# Patient Record
Sex: Female | Born: 1940 | Race: White | Hispanic: No | Smoking: Never smoker
Health system: Southern US, Community
[De-identification: ages and names within clinical notes are randomized; demographics above are authoritative.]

## PROBLEM LIST (undated history)

## (undated) DIAGNOSIS — Q2381 Bicuspid aortic valve: Secondary | ICD-10-CM

## (undated) DIAGNOSIS — R002 Palpitations: Secondary | ICD-10-CM

## (undated) DIAGNOSIS — J189 Pneumonia, unspecified organism: Secondary | ICD-10-CM

## (undated) DIAGNOSIS — M1990 Unspecified osteoarthritis, unspecified site: Secondary | ICD-10-CM

## (undated) DIAGNOSIS — F32A Depression, unspecified: Secondary | ICD-10-CM

## (undated) DIAGNOSIS — I739 Peripheral vascular disease, unspecified: Secondary | ICD-10-CM

## (undated) DIAGNOSIS — I10 Essential (primary) hypertension: Secondary | ICD-10-CM

## (undated) DIAGNOSIS — F329 Major depressive disorder, single episode, unspecified: Secondary | ICD-10-CM

## (undated) DIAGNOSIS — A809 Acute poliomyelitis, unspecified: Secondary | ICD-10-CM

## (undated) DIAGNOSIS — Q231 Congenital insufficiency of aortic valve: Secondary | ICD-10-CM

## (undated) DIAGNOSIS — E039 Hypothyroidism, unspecified: Secondary | ICD-10-CM

## (undated) DIAGNOSIS — R011 Cardiac murmur, unspecified: Secondary | ICD-10-CM

## (undated) DIAGNOSIS — E079 Disorder of thyroid, unspecified: Secondary | ICD-10-CM

## (undated) DIAGNOSIS — F419 Anxiety disorder, unspecified: Secondary | ICD-10-CM

## (undated) HISTORY — PX: TONSILLECTOMY: SUR1361

## (undated) HISTORY — PX: EYE SURGERY: SHX253

## (undated) HISTORY — PX: HERNIA REPAIR: SHX51

## (undated) HISTORY — PX: JOINT REPLACEMENT: SHX530

## (undated) HISTORY — PX: BREAST ENHANCEMENT SURGERY: SHX7

## (undated) HISTORY — PX: ABDOMINAL HYSTERECTOMY: SHX81

## (undated) HISTORY — PX: HAMMER TOE SURGERY: SHX385

## (undated) HISTORY — PX: BUNIONECTOMY: SHX129

## (undated) HISTORY — PX: BREAST SURGERY: SHX581

## (undated) HISTORY — PX: KNEE SURGERY: SHX244

## (undated) HISTORY — PX: CARDIAC CATHETERIZATION: SHX172

---

## 1991-09-10 HISTORY — PX: RADICAL VAGINAL HYSTERECTOMY: SUR662

## 2009-12-05 ENCOUNTER — Emergency Department (HOSPITAL_COMMUNITY): Admission: EM | Admit: 2009-12-05 | Discharge: 2009-12-05 | Payer: Self-pay | Admitting: Emergency Medicine

## 2011-09-12 DIAGNOSIS — Z09 Encounter for follow-up examination after completed treatment for conditions other than malignant neoplasm: Secondary | ICD-10-CM | POA: Diagnosis not present

## 2011-09-12 DIAGNOSIS — Z471 Aftercare following joint replacement surgery: Secondary | ICD-10-CM | POA: Diagnosis not present

## 2011-09-12 DIAGNOSIS — Z96649 Presence of unspecified artificial hip joint: Secondary | ICD-10-CM | POA: Diagnosis not present

## 2011-10-03 DIAGNOSIS — E785 Hyperlipidemia, unspecified: Secondary | ICD-10-CM | POA: Diagnosis not present

## 2011-10-03 DIAGNOSIS — E559 Vitamin D deficiency, unspecified: Secondary | ICD-10-CM | POA: Diagnosis not present

## 2011-10-03 DIAGNOSIS — F411 Generalized anxiety disorder: Secondary | ICD-10-CM | POA: Diagnosis not present

## 2011-10-03 DIAGNOSIS — E039 Hypothyroidism, unspecified: Secondary | ICD-10-CM | POA: Diagnosis not present

## 2011-11-28 DIAGNOSIS — E785 Hyperlipidemia, unspecified: Secondary | ICD-10-CM | POA: Diagnosis not present

## 2011-11-28 DIAGNOSIS — F411 Generalized anxiety disorder: Secondary | ICD-10-CM | POA: Diagnosis not present

## 2011-11-28 DIAGNOSIS — R030 Elevated blood-pressure reading, without diagnosis of hypertension: Secondary | ICD-10-CM | POA: Diagnosis not present

## 2012-01-15 DIAGNOSIS — Z1231 Encounter for screening mammogram for malignant neoplasm of breast: Secondary | ICD-10-CM | POA: Diagnosis not present

## 2012-02-19 ENCOUNTER — Emergency Department (HOSPITAL_COMMUNITY)
Admission: EM | Admit: 2012-02-19 | Discharge: 2012-02-19 | Disposition: A | Discharge status: Home / Self Care | Payer: Medicare Other | Attending: Emergency Medicine | Admitting: Emergency Medicine

## 2012-02-19 ENCOUNTER — Emergency Department (HOSPITAL_COMMUNITY): Payer: Medicare Other

## 2012-02-19 ENCOUNTER — Encounter (HOSPITAL_COMMUNITY): Payer: Self-pay | Admitting: Emergency Medicine

## 2012-02-19 DIAGNOSIS — R5381 Other malaise: Secondary | ICD-10-CM | POA: Diagnosis not present

## 2012-02-19 DIAGNOSIS — R531 Weakness: Secondary | ICD-10-CM

## 2012-02-19 DIAGNOSIS — R209 Unspecified disturbances of skin sensation: Secondary | ICD-10-CM | POA: Diagnosis not present

## 2012-02-19 DIAGNOSIS — I10 Essential (primary) hypertension: Secondary | ICD-10-CM | POA: Diagnosis not present

## 2012-02-19 DIAGNOSIS — Z8612 Personal history of poliomyelitis: Secondary | ICD-10-CM | POA: Insufficient documentation

## 2012-02-19 DIAGNOSIS — R079 Chest pain, unspecified: Secondary | ICD-10-CM | POA: Diagnosis not present

## 2012-02-19 DIAGNOSIS — E079 Disorder of thyroid, unspecified: Secondary | ICD-10-CM | POA: Insufficient documentation

## 2012-02-19 DIAGNOSIS — Z79899 Other long term (current) drug therapy: Secondary | ICD-10-CM | POA: Diagnosis not present

## 2012-02-19 DIAGNOSIS — Q231 Congenital insufficiency of aortic valve: Secondary | ICD-10-CM | POA: Insufficient documentation

## 2012-02-19 DIAGNOSIS — Z7982 Long term (current) use of aspirin: Secondary | ICD-10-CM | POA: Diagnosis not present

## 2012-02-19 DIAGNOSIS — N644 Mastodynia: Secondary | ICD-10-CM | POA: Diagnosis not present

## 2012-02-19 DIAGNOSIS — R5383 Other fatigue: Secondary | ICD-10-CM | POA: Insufficient documentation

## 2012-02-19 HISTORY — DX: Bicuspid aortic valve: Q23.81

## 2012-02-19 HISTORY — DX: Disorder of thyroid, unspecified: E07.9

## 2012-02-19 HISTORY — DX: Congenital insufficiency of aortic valve: Q23.1

## 2012-02-19 HISTORY — DX: Acute poliomyelitis, unspecified: A80.9

## 2012-02-19 HISTORY — DX: Essential (primary) hypertension: I10

## 2012-02-19 LAB — CBC
HCT: 42.5 % (ref 36.0–46.0)
Hemoglobin: 14.3 g/dL (ref 12.0–15.0)
MCH: 28.9 pg (ref 26.0–34.0)
MCHC: 33.6 g/dL (ref 30.0–36.0)
MCV: 85.9 fL (ref 78.0–100.0)
RBC: 4.95 MIL/uL (ref 3.87–5.11)
WBC: 8.5 10*3/uL (ref 4.0–10.5)

## 2012-02-19 LAB — BASIC METABOLIC PANEL
Calcium: 10.2 mg/dL (ref 8.4–10.5)
Creatinine, Ser: 0.53 mg/dL (ref 0.50–1.10)
GFR calc non Af Amer: >90 mL/min (ref >90)
Potassium: 3.4 mEq/L — ABNORMAL LOW (ref 3.5–5.1)

## 2012-02-19 LAB — URINE MICROSCOPIC-ADD ON

## 2012-02-19 LAB — URINALYSIS, ROUTINE W REFLEX MICROSCOPIC
Bilirubin Urine: NEGATIVE
Glucose, UA: NEGATIVE mg/dL
Ketones, ur: NEGATIVE mg/dL
Nitrite: NEGATIVE
Protein, ur: NEGATIVE mg/dL
Specific Gravity, Urine: 1.009 (ref 1.005–1.030)
Urobilinogen, UA: 0.2 mg/dL (ref 0.0–1.0)
pH: 7 (ref 5.0–8.0)

## 2012-02-19 LAB — DIFFERENTIAL
Eosinophils Relative: 2 % (ref 0–5)
Lymphocytes Relative: 23 % (ref 12–46)
Monocytes Absolute: 0.6 10*3/uL (ref 0.1–1.0)

## 2012-02-19 MED ORDER — SODIUM CHLORIDE 0.9 % IV SOLN
INTRAVENOUS | Status: DC
  Administered 2012-02-19: 20:00:00 via INTRAVENOUS

## 2012-02-19 NOTE — ED Notes (Signed)
Pt is randomly shaking and states its her way to relief stress and worry.

## 2012-02-19 NOTE — ED Notes (Signed)
Feeling shaky and weak. Feeling numb on feet and hands. Eating less today. No coughing or chest pain.

## 2012-02-19 NOTE — ED Notes (Signed)
Reports job as stressful and is worried about financial problems. 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

## 2012-02-19 NOTE — ED Provider Notes (Signed)
History     CSN: [id number]  Arrival date & time 02/19/12  1657   First MD Initiated Contact with Patient 02/19/12 1736      Chief Complaint  Patient presents with  . Fatigue    (Consider location/radiation/quality/duration/timing/severity/associated sxs/prior treatment) HPI Comments: Crystal Levy is a 71 y.o. female who has onset of a weak and shaky feeling, and bilateral numbness in hands, and feet today. She denies chest pain, weakness, dizziness, nausea, or vomiting. She has a sensation of stinging in her left chest and upper abdomen that started today.   The history is provided by the patient.    Past Medical History  Diagnosis Date  . Polio   . Hypertension   . Bicuspid aortic valve   . Thyroid disease     Past Surgical History  Procedure Date  . Hernia repair   . Joint replacement   . Tonsillectomy   . Breast surgery   . Abdominal hysterectomy   . Radical vaginal hysterectomy 1993    No family history on file.  History  Substance Use Topics  . Smoking status: Former Games developer  . Smokeless tobacco: Not on file  . Alcohol Use: 0.0 oz/week    0 Glasses of wine per week     wine    OB History    Grav Para Term Preterm Abortions TAB SAB Ect Mult Living                  Review of Systems  Allergies  Azithromycin and Eggs or egg-derived products  Home Medications   Current Outpatient Rx  Name Route Sig Dispense Refill  . AMLODIPINE BESYLATE 5 MG PO TABS Oral Take 5 mg by mouth daily.    Marland Kitchen VITAMIN C PO Oral Take 1 tablet by mouth daily.    . ASPIRIN 81 MG PO CHEW Oral Chew 648 mg by mouth once.    Marland Kitchen VITAMIN B COMPLEX PO Oral Take 1 tablet by mouth daily.    Marland Kitchen CALCIUM + D PO Oral Take 1 tablet by mouth daily.    Marland Kitchen DOCUSATE SODIUM 100 MG PO CAPS Oral Take 100 mg by mouth 2 (two) times daily as needed. For constipation.    . OMEGA-3 FATTY ACIDS 1000 MG PO CAPS Oral Take 2 g by mouth daily.    Marland Kitchen LEVOTHYROXINE SODIUM 112 MCG PO TABS Oral Take 112 mcg  by mouth daily.    Marland Kitchen LORAZEPAM 0.5 MG PO TABS Oral Take 0.5 mg by mouth 2 (two) times daily as needed. For sleep/anxiety.    . ADULT MULTIVITAMIN W/MINERALS CH Oral Take 1 tablet by mouth daily.    . SERTRALINE HCL 25 MG PO TABS Oral Take 25 mg by mouth daily.      BP 153/89  Pulse 70  Temp 98.3 F (36.8 C) (Oral)  Resp 16  SpO2 96%  Physical Exam  Nursing note and vitals reviewed. Constitutional: She is oriented to person, place, and time. She appears well-developed and well-nourished.  HENT:  Head: Normocephalic and atraumatic.  Eyes: Conjunctivae and EOM are normal. Pupils are equal, round, and reactive to light.  Neck: Normal range of motion and phonation normal. Neck supple.  Cardiovascular: Normal rate, regular rhythm and intact distal pulses.   Pulmonary/Chest: Effort normal and breath sounds normal. She exhibits no tenderness.  Abdominal: Soft. She exhibits no distension. There is no tenderness. There is no guarding.  Musculoskeletal: Normal range of motion.  Neurological: She is alert  and oriented to person, place, and time. She has normal strength. She exhibits normal muscle tone.  Skin: Skin is warm and dry.       Vague 1 cm, flat reddened area of the left breast, without vesicles. No other rash in a thoracic, or abdominal dermatome  Psychiatric: She has a normal mood and affect. Her behavior is normal. Judgment and thought content normal.    ED Course  Procedures (including critical care time)  Labs Reviewed  DIFFERENTIAL - Abnormal; Notable for the following:    Basophils Relative 2 (*)     All other components within normal limits  BASIC METABOLIC PANEL - Abnormal; Notable for the following:    Potassium 3.4 (*)     All other components within normal limits  URINALYSIS, ROUTINE W REFLEX MICROSCOPIC - Abnormal; Notable for the following:    Leukocytes, UA TRACE (*)     All other components within normal limits  CBC  TROPONIN I  URINE MICROSCOPIC-ADD ON   Dg  Chest 2 View  02/19/2012  *RADIOLOGY REPORT*  Clinical Data:   Left breast pain.  CHEST - 2 VIEW  Comparison: None.  Findings: Calcified bilateral breast implants are identified. Lungs are clear.  No pneumothorax or effusion.  Heart size normal. No focal bony abnormality.  IMPRESSION: No acute finding.  Original Report Authenticated By: Bernadene Bell. Maricela Curet, M.D.    Date: 02/19/2012  Rate: 64  Rhythm: normal sinus rhythm  QRS Axis: normal  Intervals: normal  ST/T Wave abnormalities: nonspecific ST changes  Conduction Disutrbances:none  Narrative Interpretation:   Old EKG Reviewed: none available   1. Weakness       MDM  Nonspecific malaise with possible early shingles. Doubt ACS, PE, or pneumonia. Doubt metabolic instability, serious bacterial infection or impending vascular collapse; the patient is stable for discharge.        Flint Melter, MD 02/19/12 2340

## 2012-02-19 NOTE — ED Notes (Signed)
MD at bedside. 

## 2012-02-19 NOTE — Discharge Instructions (Signed)

## 2012-02-19 NOTE — ED Notes (Signed)
Started at 1430 today. Started feeling "bee stings" in left breast. Then began with dizziness, nausea, weakness. Hx of Polio with right sided weakness/

## 2012-02-27 DIAGNOSIS — E559 Vitamin D deficiency, unspecified: Secondary | ICD-10-CM | POA: Diagnosis not present

## 2012-02-27 DIAGNOSIS — E039 Hypothyroidism, unspecified: Secondary | ICD-10-CM | POA: Diagnosis not present

## 2012-02-27 DIAGNOSIS — E785 Hyperlipidemia, unspecified: Secondary | ICD-10-CM | POA: Diagnosis not present

## 2012-02-27 DIAGNOSIS — I10 Essential (primary) hypertension: Secondary | ICD-10-CM | POA: Diagnosis not present

## 2012-02-27 DIAGNOSIS — Z0000 Encounter for general adult medical examination without abnormal findings: Secondary | ICD-10-CM | POA: Diagnosis not present

## 2012-02-27 DIAGNOSIS — Z79899 Other long term (current) drug therapy: Secondary | ICD-10-CM | POA: Diagnosis not present

## 2012-02-27 DIAGNOSIS — H6120 Impacted cerumen, unspecified ear: Secondary | ICD-10-CM | POA: Diagnosis not present

## 2012-02-27 DIAGNOSIS — Z124 Encounter for screening for malignant neoplasm of cervix: Secondary | ICD-10-CM | POA: Diagnosis not present

## 2012-02-27 DIAGNOSIS — N644 Mastodynia: Secondary | ICD-10-CM | POA: Diagnosis not present

## 2012-02-27 DIAGNOSIS — Z1211 Encounter for screening for malignant neoplasm of colon: Secondary | ICD-10-CM | POA: Diagnosis not present

## 2012-03-03 DIAGNOSIS — R159 Full incontinence of feces: Secondary | ICD-10-CM | POA: Diagnosis not present

## 2012-03-03 DIAGNOSIS — N3946 Mixed incontinence: Secondary | ICD-10-CM | POA: Diagnosis not present

## 2012-03-03 DIAGNOSIS — N319 Neuromuscular dysfunction of bladder, unspecified: Secondary | ICD-10-CM | POA: Diagnosis not present

## 2012-03-03 DIAGNOSIS — B49 Unspecified mycosis: Secondary | ICD-10-CM | POA: Diagnosis not present

## 2012-03-03 DIAGNOSIS — R300 Dysuria: Secondary | ICD-10-CM | POA: Diagnosis not present

## 2012-03-31 DIAGNOSIS — I739 Peripheral vascular disease, unspecified: Secondary | ICD-10-CM | POA: Diagnosis not present

## 2012-03-31 DIAGNOSIS — E785 Hyperlipidemia, unspecified: Secondary | ICD-10-CM | POA: Diagnosis not present

## 2012-03-31 DIAGNOSIS — I10 Essential (primary) hypertension: Secondary | ICD-10-CM | POA: Diagnosis not present

## 2012-03-31 DIAGNOSIS — F411 Generalized anxiety disorder: Secondary | ICD-10-CM | POA: Diagnosis not present

## 2012-05-13 DIAGNOSIS — Z96649 Presence of unspecified artificial hip joint: Secondary | ICD-10-CM | POA: Diagnosis not present

## 2012-05-13 DIAGNOSIS — Z471 Aftercare following joint replacement surgery: Secondary | ICD-10-CM | POA: Diagnosis not present

## 2012-05-13 DIAGNOSIS — Z09 Encounter for follow-up examination after completed treatment for conditions other than malignant neoplasm: Secondary | ICD-10-CM | POA: Diagnosis not present

## 2012-05-13 DIAGNOSIS — M25569 Pain in unspecified knee: Secondary | ICD-10-CM | POA: Diagnosis not present

## 2012-05-26 DIAGNOSIS — L820 Inflamed seborrheic keratosis: Secondary | ICD-10-CM | POA: Diagnosis not present

## 2012-05-27 DIAGNOSIS — E559 Vitamin D deficiency, unspecified: Secondary | ICD-10-CM | POA: Diagnosis not present

## 2012-05-27 DIAGNOSIS — E063 Autoimmune thyroiditis: Secondary | ICD-10-CM | POA: Diagnosis not present

## 2012-05-27 DIAGNOSIS — I10 Essential (primary) hypertension: Secondary | ICD-10-CM | POA: Diagnosis not present

## 2012-05-27 DIAGNOSIS — E785 Hyperlipidemia, unspecified: Secondary | ICD-10-CM | POA: Diagnosis not present

## 2012-07-14 DIAGNOSIS — H2510 Age-related nuclear cataract, unspecified eye: Secondary | ICD-10-CM | POA: Diagnosis not present

## 2012-07-14 DIAGNOSIS — H43819 Vitreous degeneration, unspecified eye: Secondary | ICD-10-CM | POA: Diagnosis not present

## 2012-07-14 DIAGNOSIS — H04129 Dry eye syndrome of unspecified lacrimal gland: Secondary | ICD-10-CM | POA: Diagnosis not present

## 2012-07-14 DIAGNOSIS — H1045 Other chronic allergic conjunctivitis: Secondary | ICD-10-CM | POA: Diagnosis not present

## 2012-09-15 DIAGNOSIS — J029 Acute pharyngitis, unspecified: Secondary | ICD-10-CM | POA: Diagnosis not present

## 2012-09-15 DIAGNOSIS — J111 Influenza due to unidentified influenza virus with other respiratory manifestations: Secondary | ICD-10-CM | POA: Diagnosis not present

## 2012-09-15 DIAGNOSIS — J329 Chronic sinusitis, unspecified: Secondary | ICD-10-CM | POA: Diagnosis not present

## 2012-09-15 DIAGNOSIS — R05 Cough: Secondary | ICD-10-CM | POA: Diagnosis not present

## 2012-10-08 DIAGNOSIS — E063 Autoimmune thyroiditis: Secondary | ICD-10-CM | POA: Diagnosis not present

## 2012-10-08 DIAGNOSIS — E785 Hyperlipidemia, unspecified: Secondary | ICD-10-CM | POA: Diagnosis not present

## 2012-10-26 DIAGNOSIS — N644 Mastodynia: Secondary | ICD-10-CM | POA: Diagnosis not present

## 2012-11-19 DIAGNOSIS — Z79899 Other long term (current) drug therapy: Secondary | ICD-10-CM | POA: Diagnosis not present

## 2012-11-25 DIAGNOSIS — T8549XA Other mechanical complication of breast prosthesis and implant, initial encounter: Secondary | ICD-10-CM | POA: Diagnosis not present

## 2012-11-25 DIAGNOSIS — T8579XA Infection and inflammatory reaction due to other internal prosthetic devices, implants and grafts, initial encounter: Secondary | ICD-10-CM | POA: Diagnosis not present

## 2013-01-01 DIAGNOSIS — M23302 Other meniscus derangements, unspecified lateral meniscus, unspecified knee: Secondary | ICD-10-CM | POA: Diagnosis not present

## 2013-01-01 DIAGNOSIS — E785 Hyperlipidemia, unspecified: Secondary | ICD-10-CM | POA: Diagnosis not present

## 2013-01-01 DIAGNOSIS — S83289A Other tear of lateral meniscus, current injury, unspecified knee, initial encounter: Secondary | ICD-10-CM | POA: Diagnosis not present

## 2013-01-01 DIAGNOSIS — Z882 Allergy status to sulfonamides status: Secondary | ICD-10-CM | POA: Diagnosis not present

## 2013-01-01 DIAGNOSIS — I38 Endocarditis, valve unspecified: Secondary | ICD-10-CM | POA: Diagnosis not present

## 2013-01-01 DIAGNOSIS — Z79899 Other long term (current) drug therapy: Secondary | ICD-10-CM | POA: Diagnosis not present

## 2013-01-01 DIAGNOSIS — M23329 Other meniscus derangements, posterior horn of medial meniscus, unspecified knee: Secondary | ICD-10-CM | POA: Diagnosis not present

## 2013-01-01 DIAGNOSIS — IMO0002 Reserved for concepts with insufficient information to code with codable children: Secondary | ICD-10-CM | POA: Diagnosis not present

## 2013-01-01 DIAGNOSIS — Z8612 Personal history of poliomyelitis: Secondary | ICD-10-CM | POA: Diagnosis not present

## 2013-01-01 DIAGNOSIS — M2240 Chondromalacia patellae, unspecified knee: Secondary | ICD-10-CM | POA: Diagnosis not present

## 2013-01-01 DIAGNOSIS — M25569 Pain in unspecified knee: Secondary | ICD-10-CM | POA: Diagnosis not present

## 2013-01-01 DIAGNOSIS — M9420 Chondromalacia, unspecified site: Secondary | ICD-10-CM | POA: Diagnosis not present

## 2013-01-01 DIAGNOSIS — I10 Essential (primary) hypertension: Secondary | ICD-10-CM | POA: Diagnosis not present

## 2013-01-01 DIAGNOSIS — E039 Hypothyroidism, unspecified: Secondary | ICD-10-CM | POA: Diagnosis not present

## 2013-01-01 DIAGNOSIS — M23359 Other meniscus derangements, posterior horn of lateral meniscus, unspecified knee: Secondary | ICD-10-CM | POA: Diagnosis not present

## 2013-01-01 DIAGNOSIS — Z880 Allergy status to penicillin: Secondary | ICD-10-CM | POA: Diagnosis not present

## 2013-01-19 DIAGNOSIS — M23329 Other meniscus derangements, posterior horn of medial meniscus, unspecified knee: Secondary | ICD-10-CM | POA: Diagnosis not present

## 2013-01-25 DIAGNOSIS — M25569 Pain in unspecified knee: Secondary | ICD-10-CM | POA: Diagnosis not present

## 2013-01-26 DIAGNOSIS — R5383 Other fatigue: Secondary | ICD-10-CM | POA: Diagnosis not present

## 2013-01-26 DIAGNOSIS — E063 Autoimmune thyroiditis: Secondary | ICD-10-CM | POA: Diagnosis not present

## 2013-01-26 DIAGNOSIS — E559 Vitamin D deficiency, unspecified: Secondary | ICD-10-CM | POA: Diagnosis not present

## 2013-01-26 DIAGNOSIS — E039 Hypothyroidism, unspecified: Secondary | ICD-10-CM | POA: Diagnosis not present

## 2013-01-26 DIAGNOSIS — R5381 Other malaise: Secondary | ICD-10-CM | POA: Diagnosis not present

## 2013-01-26 DIAGNOSIS — R209 Unspecified disturbances of skin sensation: Secondary | ICD-10-CM | POA: Diagnosis not present

## 2013-01-26 DIAGNOSIS — R0602 Shortness of breath: Secondary | ICD-10-CM | POA: Diagnosis not present

## 2013-02-02 DIAGNOSIS — M25569 Pain in unspecified knee: Secondary | ICD-10-CM | POA: Diagnosis not present

## 2013-02-04 ENCOUNTER — Ambulatory Visit (INDEPENDENT_AMBULATORY_CARE_PROVIDER_SITE_OTHER): Payer: Medicare Other | Admitting: Cardiology

## 2013-02-04 ENCOUNTER — Encounter: Payer: Self-pay | Admitting: Cardiology

## 2013-02-04 VITALS — BP 140/75 | HR 77 | Ht 66.0 in | Wt 142.8 lb

## 2013-02-04 DIAGNOSIS — I359 Nonrheumatic aortic valve disorder, unspecified: Secondary | ICD-10-CM

## 2013-02-04 DIAGNOSIS — I350 Nonrheumatic aortic (valve) stenosis: Secondary | ICD-10-CM

## 2013-02-04 MED ORDER — HYDROCHLOROTHIAZIDE 12.5 MG PO CAPS: 12.5000 mg | ORAL_CAPSULE | Freq: Every day | ORAL | Status: DC

## 2013-02-04 NOTE — Progress Notes (Signed)
HPI The patient presents as a new patient. She has a history of a bicuspid aortic valve. She has not been seen by a cardiologist and her for years. I was able to review some records but does not include a previous echocardiogram. She said she's had cardiac catheterizations as well which demonstrated no obstructive coronary disease. I did see a CT scan from 2012. There was some blockage described in the right leg. However, this was managed medically. It was unclear from their report whether this was related to previous polio.  She's not locating her care to this practice. She does get dyspneic walking a short distance on level ground. However, she's just recovering from left knee surgery. She has been somewhat inactive. She denies any chest pressure, neck or arm discomfort she denies any palpitations, presyncope or syncope. The dyspnea is slowly progressive. She does not report resting shortness of breath, PND or orthopnea. She does not have any palpitations, presyncope or syncope. She reports no weight gain or edema.  Allergies  Allergen Reactions  . Azithromycin   . Eggs Or Egg-Derived Products     Current Outpatient Prescriptions  Medication Sig Dispense Refill  . amLODipine (NORVASC) 5 MG tablet Take 5 mg by mouth daily.      . Ascorbic Acid (VITAMIN C PO) Take 1 tablet by mouth daily.      Marland Kitchen aspirin 81 MG chewable tablet Chew 648 mg by mouth once.      . B Complex Vitamins (VITAMIN B COMPLEX PO) Take 1 tablet by mouth daily.      . Calcium Carbonate-Vitamin D (CALCIUM + D PO) Take 1 tablet by mouth daily.      Marland Kitchen docusate sodium (COLACE) 100 MG capsule Take 100 mg by mouth 2 (two) times daily as needed. For constipation.      Marland Kitchen levothyroxine (SYNTHROID, LEVOTHROID) 112 MCG tablet Take 112 mcg by mouth daily.      Marland Kitchen LORazepam (ATIVAN) 0.5 MG tablet Take 0.5 mg by mouth 2 (two) times daily as needed. For sleep/anxiety.      . Multiple Vitamin (MULTIVITAMIN WITH MINERALS) TABS Take 1 tablet  by mouth daily.       No current facility-administered medications for this visit.    Past Medical History  Diagnosis Date  . Polio   . Hypertension   . Bicuspid aortic valve   . Thyroid disease     Past Surgical History  Procedure Laterality Date  . Hernia repair    . Joint replacement    . Tonsillectomy    . Breast surgery    . Abdominal hysterectomy    . Radical vaginal hysterectomy  1993    No family history on file.  History   Social History  . Marital Status: Divorced    Spouse Name: N/A    Number of Children: N/A  . Years of Education: N/A   Occupational History  . Not on file.   Social History Main Topics  . Smoking status: Former Games developer  . Smokeless tobacco: Not on file  . Alcohol Use: 0.0 oz/week    0 Glasses of wine per week     Comment: wine  . Drug Use:   . Sexually Active:    Other Topics Concern  . Not on file   Social History Narrative  . No narrative on file   ROS:  Positive for fatigue, difficulty swallowing, diarrhea constipation. Otherwise as stated in the HPI and negative for all other systems.  PHYSICAL EXAM BP 140/75  Pulse 77  Ht 5\' 6"  (1.676 m)  Wt 142 lb 12.8 oz (64.774 kg)  BMI 23.06 kg/m2 GENERAL:  Well appearing HEENT:  Pupils equal round and reactive, fundi not visualized, oral mucosa unremarkable NECK:  No jugular venous distention, waveform within normal limits, carotid upstroke brisk and symmetric, no bruits, no thyromegaly LYMPHATICS:  No cervical, inguinal adenopathy LUNGS:  Clear to auscultation bilaterally BACK:  No CVA tenderness CHEST:  Unremarkable HEART:  PMI not displaced or sustained,S1 and S2 within normal limits, no S3, no S4, no clicks, no rubs, aapical systolic murmur 2/6 radiating out the aortic outflow tract, no diastolicmurmurs ABD:  Flat, positive bowel sounds normal in frequency in pitch, no bruits, no rebound, no guarding, no midline pulsatile mass, no hepatomegaly, no splenomegaly EXT:  2 plus  pulses upper, no edema, no cyanosis no clubbing, right leg is smaller secondary to polio, diminished dorsalis pedis and posterior tibials bilaterally SKIN:  No rashes no nodules NEURO:  Cranial nerves II through XII grossly intact, motor grossly intact throughout PSYCH:  Cognitively intact, oriented to person place and time   EKG:  Sinus rhythm, rate 77, axis within normal limits, intervals within normal limits, no acute ST-T wave changes. 02/04/13  ASSESSMENT AND PLAN  AORTIC STENOSIS: I suspect that this is mild.  I will however, given her AS repeat an echo and order a BNP when she returns.  HTN:  Her BP is still slightly elevated.  I will add low dose HCTZ.  I will check a BMET when she returns.  PVD:  Continue risk reduction.

## 2013-02-04 NOTE — Patient Instructions (Addendum)
Please start Hydrochlorothiazide 12.5 mg daily Continue all other medications as listed  Your physician has requested that you have an echocardiogram. Echocardiography is a painless test that uses sound waves to create images of your heart. It provides your doctor with information about the size and shape of your heart and how well your heart's chambers and valves are working. This procedure takes approximately one hour. There are no restrictions for this procedure.  Follow up after testing with Dr Antoine Poche.

## 2013-02-05 ENCOUNTER — Encounter: Payer: Self-pay | Admitting: Cardiology

## 2013-02-06 DIAGNOSIS — I350 Nonrheumatic aortic (valve) stenosis: Secondary | ICD-10-CM | POA: Insufficient documentation

## 2013-02-08 ENCOUNTER — Ambulatory Visit: Payer: No Typology Code available for payment source | Admitting: Cardiology

## 2013-02-09 DIAGNOSIS — M25569 Pain in unspecified knee: Secondary | ICD-10-CM | POA: Diagnosis not present

## 2013-02-16 DIAGNOSIS — M25569 Pain in unspecified knee: Secondary | ICD-10-CM | POA: Diagnosis not present

## 2013-02-17 ENCOUNTER — Other Ambulatory Visit (HOSPITAL_COMMUNITY): Payer: No Typology Code available for payment source

## 2013-02-17 ENCOUNTER — Ambulatory Visit (HOSPITAL_COMMUNITY): Payer: Medicare Other | Attending: Cardiology | Admitting: Radiology

## 2013-02-17 DIAGNOSIS — Q231 Congenital insufficiency of aortic valve: Secondary | ICD-10-CM | POA: Diagnosis not present

## 2013-02-17 DIAGNOSIS — Z87891 Personal history of nicotine dependence: Secondary | ICD-10-CM | POA: Insufficient documentation

## 2013-02-17 DIAGNOSIS — I359 Nonrheumatic aortic valve disorder, unspecified: Secondary | ICD-10-CM

## 2013-02-17 DIAGNOSIS — I10 Essential (primary) hypertension: Secondary | ICD-10-CM | POA: Diagnosis not present

## 2013-02-17 DIAGNOSIS — R0602 Shortness of breath: Secondary | ICD-10-CM | POA: Insufficient documentation

## 2013-02-17 NOTE — Progress Notes (Signed)
Echocardiogram performed.  

## 2013-02-23 DIAGNOSIS — M25569 Pain in unspecified knee: Secondary | ICD-10-CM | POA: Diagnosis not present

## 2013-03-04 DIAGNOSIS — M25569 Pain in unspecified knee: Secondary | ICD-10-CM | POA: Diagnosis not present

## 2013-03-15 DIAGNOSIS — E039 Hypothyroidism, unspecified: Secondary | ICD-10-CM | POA: Diagnosis not present

## 2013-03-15 DIAGNOSIS — E049 Nontoxic goiter, unspecified: Secondary | ICD-10-CM | POA: Diagnosis not present

## 2013-03-15 DIAGNOSIS — Z13820 Encounter for screening for osteoporosis: Secondary | ICD-10-CM | POA: Diagnosis not present

## 2013-03-15 DIAGNOSIS — E063 Autoimmune thyroiditis: Secondary | ICD-10-CM | POA: Diagnosis not present

## 2013-03-15 DIAGNOSIS — Z9181 History of falling: Secondary | ICD-10-CM | POA: Diagnosis not present

## 2013-03-16 DIAGNOSIS — M25569 Pain in unspecified knee: Secondary | ICD-10-CM | POA: Diagnosis not present

## 2013-03-25 DIAGNOSIS — M25569 Pain in unspecified knee: Secondary | ICD-10-CM | POA: Diagnosis not present

## 2013-03-30 DIAGNOSIS — M25569 Pain in unspecified knee: Secondary | ICD-10-CM | POA: Diagnosis not present

## 2013-04-06 DIAGNOSIS — M25569 Pain in unspecified knee: Secondary | ICD-10-CM | POA: Diagnosis not present

## 2013-04-14 DIAGNOSIS — Z780 Asymptomatic menopausal state: Secondary | ICD-10-CM | POA: Diagnosis not present

## 2013-04-14 DIAGNOSIS — Z13820 Encounter for screening for osteoporosis: Secondary | ICD-10-CM | POA: Diagnosis not present

## 2013-04-29 ENCOUNTER — Ambulatory Visit (INDEPENDENT_AMBULATORY_CARE_PROVIDER_SITE_OTHER): Payer: Medicare Other | Admitting: Cardiology

## 2013-04-29 ENCOUNTER — Encounter: Payer: Self-pay | Admitting: Cardiology

## 2013-04-29 VITALS — BP 128/78 | HR 67 | Ht 65.0 in | Wt 137.0 lb

## 2013-04-29 DIAGNOSIS — I10 Essential (primary) hypertension: Secondary | ICD-10-CM | POA: Diagnosis not present

## 2013-04-29 DIAGNOSIS — I359 Nonrheumatic aortic valve disorder, unspecified: Secondary | ICD-10-CM | POA: Diagnosis not present

## 2013-04-29 DIAGNOSIS — I350 Nonrheumatic aortic (valve) stenosis: Secondary | ICD-10-CM

## 2013-04-29 LAB — BASIC METABOLIC PANEL
GFR: 131.81 mL/min (ref >60.00)
Glucose, Bld: 86 mg/dL (ref 70–99)

## 2013-04-29 NOTE — Progress Notes (Signed)
HPI The patient presents as a new patient. She has a history of a bicuspid aortic valve.however, at my initial appointment with her with a followup echo I was unable to confirm bicuspid valve. She had no significant stenosis. At that appointment she was hypertensive and I did treat her by adding HCTZ. She is very happy because her blood pressure is much better control. She thinks some lower extremity swelling that she had is better. She is walking and plans on exercising in the pool.  The patient denies any new symptoms such as chest discomfort, neck or arm discomfort. There has been no PND or orthopnea. There have been no reported palpitations, presyncope or syncope.  She does report that she gets dyspneic with activities such as making the bed but she says this is chronic.  Allergies  Allergen Reactions  . Azithromycin   . Clindamycin/Lincomycin   . Eggs Or Egg-Derived Products   . Fenofibrate     Current Outpatient Prescriptions  Medication Sig Dispense Refill  . amLODipine (NORVASC) 5 MG tablet Take 5 mg by mouth daily.      . Ascorbic Acid (VITAMIN C PO) Take 1 tablet by mouth daily.      . B Complex Vitamins (VITAMIN B COMPLEX PO) Take 1 tablet by mouth daily.      . Calcium Carbonate-Vitamin D (CALCIUM + D PO) Take 1 tablet by mouth daily.      Marland Kitchen docusate sodium (COLACE) 100 MG capsule Take 100 mg by mouth 2 (two) times daily as needed. For constipation.      . hydrochlorothiazide (MICROZIDE) 12.5 MG capsule Take 1 capsule (12.5 mg total) by mouth daily.  30 capsule  11  . levothyroxine (SYNTHROID, LEVOTHROID) 112 MCG tablet Take 112 mcg by mouth daily.      Marland Kitchen LORazepam (ATIVAN) 0.5 MG tablet Take 0.5 mg by mouth 2 (two) times daily as needed. For sleep/anxiety.      . Multiple Vitamin (MULTIVITAMIN WITH MINERALS) TABS Take 1 tablet by mouth daily.       No current facility-administered medications for this visit.    Past Medical History  Diagnosis Date  . Polio   .  Hypertension   . Bicuspid aortic valve   . Thyroid disease     Past Surgical History  Procedure Laterality Date  . Hernia repair    . Joint replacement      left hip  . Tonsillectomy    . Breast surgery    . Radical vaginal hysterectomy  1993  . Knee surgery      meniscus  . Bunionectomy    . Hammer toe surgery      ROS:  Positive for fatigue, difficulty swallowing, diarrhea constipation. Otherwise as stated in the HPI and negative for all other systems.  PHYSICAL EXAM BP 128/78  Pulse 67  Ht 5\' 5"  (1.651 m)  Wt 137 lb (62.143 kg)  BMI 22.8 kg/m2 GENERAL:  Well appearing HEENT:  Pupils equal round and reactive, fundi not visualized, oral mucosa unremarkable NECK:  No jugular venous distention, waveform within normal limits, carotid upstroke brisk and symmetric, no bruits, no thyromegaly LYMPHATICS:  No cervical, inguinal adenopathy LUNGS:  Clear to auscultation bilaterally BACK:  No CVA tenderness CHEST:  Unremarkable HEART:  PMI not displaced or sustained,S1 and S2 within normal limits, no S3, no S4, no clicks, no rubs, aapical systolic murmur 2/6 radiating out the aortic outflow tract, no diastolicmurmurs ABD:  Flat, positive bowel sounds normal  in frequency in pitch, no bruits, no rebound, no guarding, no midline pulsatile mass, no hepatomegaly, no splenomegaly EXT:  2 plus pulses upper, no edema, no cyanosis no clubbing, right leg is smaller secondary to polio, diminished dorsalis pedis and posterior tibials bilaterally SKIN:  No rashes no nodules NEURO:  Cranial nerves II through XII grossly intact, motor grossly intact throughout PSYCH:  Cognitively intact, oriented to person place and time  EKG:  Sinus rhythm, rate 67, axis within normal limits, intervals within normal limits, no acute ST-T wave changes. 04/29/2013  ASSESSMENT AND PLAN  AORTIC STENOSIS: we could not confirm or exclude bicuspid valve on the recent echo but there was no stenosis. We will follow this  clinically.  HTN:  Her BP is now well controlled. She's tolerating the HCTZ.  I will check a BMET.   PVD:  Continue risk reduction.    DYSPNEA:  She does have this mildly but declines further workup.

## 2013-04-29 NOTE — Patient Instructions (Addendum)
The current medical regimen is effective;  continue present plan and medications.  Please have blood work today (BMP)  Follow up in 1 year with Dr Hochrein.  You will receive a letter in the mail 2 months before you are due.  Please call us when you receive this letter to schedule your follow up appointment.  

## 2013-05-04 DIAGNOSIS — H938X9 Other specified disorders of ear, unspecified ear: Secondary | ICD-10-CM | POA: Diagnosis not present

## 2013-05-04 DIAGNOSIS — F411 Generalized anxiety disorder: Secondary | ICD-10-CM | POA: Diagnosis not present

## 2013-05-13 DIAGNOSIS — Z96649 Presence of unspecified artificial hip joint: Secondary | ICD-10-CM | POA: Diagnosis not present

## 2013-05-13 DIAGNOSIS — Z09 Encounter for follow-up examination after completed treatment for conditions other than malignant neoplasm: Secondary | ICD-10-CM | POA: Diagnosis not present

## 2013-05-13 DIAGNOSIS — Z471 Aftercare following joint replacement surgery: Secondary | ICD-10-CM | POA: Diagnosis not present

## 2013-05-24 DIAGNOSIS — E785 Hyperlipidemia, unspecified: Secondary | ICD-10-CM | POA: Diagnosis not present

## 2013-05-24 DIAGNOSIS — I10 Essential (primary) hypertension: Secondary | ICD-10-CM | POA: Diagnosis not present

## 2013-05-24 DIAGNOSIS — F341 Dysthymic disorder: Secondary | ICD-10-CM | POA: Diagnosis not present

## 2013-05-24 DIAGNOSIS — E559 Vitamin D deficiency, unspecified: Secondary | ICD-10-CM | POA: Diagnosis not present

## 2013-05-24 DIAGNOSIS — E039 Hypothyroidism, unspecified: Secondary | ICD-10-CM | POA: Diagnosis not present

## 2013-06-01 ENCOUNTER — Telehealth: Payer: Self-pay | Admitting: Cardiology

## 2013-06-01 NOTE — Telephone Encounter (Signed)
Advised pt that we do not have the vaccination here and in fact have not given the flu shot here in the past several yrs.  It is un-certain at this time if with will give flu shots but we have never had any not made with albumin.  Suggested she call her PCP.  She stated understanding.

## 2013-06-01 NOTE — Telephone Encounter (Signed)
New Problem:  Pt states she is interested in a Flu shot. However, she is allergic to eggs and has asthma. She'd like to be advised on getting a flu shot.

## 2013-06-21 DIAGNOSIS — F341 Dysthymic disorder: Secondary | ICD-10-CM | POA: Diagnosis not present

## 2013-07-15 ENCOUNTER — Other Ambulatory Visit: Payer: Self-pay

## 2013-07-20 DIAGNOSIS — H1045 Other chronic allergic conjunctivitis: Secondary | ICD-10-CM | POA: Diagnosis not present

## 2013-07-20 DIAGNOSIS — H04129 Dry eye syndrome of unspecified lacrimal gland: Secondary | ICD-10-CM | POA: Diagnosis not present

## 2013-07-20 DIAGNOSIS — H43819 Vitreous degeneration, unspecified eye: Secondary | ICD-10-CM | POA: Diagnosis not present

## 2013-07-20 DIAGNOSIS — H2589 Other age-related cataract: Secondary | ICD-10-CM | POA: Diagnosis not present

## 2013-08-17 DIAGNOSIS — F341 Dysthymic disorder: Secondary | ICD-10-CM | POA: Diagnosis not present

## 2013-08-17 DIAGNOSIS — K589 Irritable bowel syndrome without diarrhea: Secondary | ICD-10-CM | POA: Diagnosis not present

## 2013-08-17 DIAGNOSIS — R109 Unspecified abdominal pain: Secondary | ICD-10-CM | POA: Diagnosis not present

## 2013-08-17 DIAGNOSIS — Z1211 Encounter for screening for malignant neoplasm of colon: Secondary | ICD-10-CM | POA: Diagnosis not present

## 2013-08-27 ENCOUNTER — Other Ambulatory Visit: Payer: Self-pay | Admitting: Gastroenterology

## 2013-08-27 DIAGNOSIS — R198 Other specified symptoms and signs involving the digestive system and abdomen: Secondary | ICD-10-CM | POA: Diagnosis not present

## 2013-08-27 DIAGNOSIS — R109 Unspecified abdominal pain: Secondary | ICD-10-CM

## 2013-08-27 DIAGNOSIS — R194 Change in bowel habit: Secondary | ICD-10-CM

## 2013-08-27 DIAGNOSIS — R140 Abdominal distension (gaseous): Secondary | ICD-10-CM

## 2013-08-27 DIAGNOSIS — R1084 Generalized abdominal pain: Secondary | ICD-10-CM | POA: Diagnosis not present

## 2013-08-27 DIAGNOSIS — R141 Gas pain: Secondary | ICD-10-CM | POA: Diagnosis not present

## 2013-08-28 ENCOUNTER — Encounter: Payer: Self-pay | Admitting: Cardiology

## 2013-09-03 ENCOUNTER — Ambulatory Visit
Admission: RE | Admit: 2013-09-03 | Discharge: 2013-09-03 | Disposition: A | Discharge status: Home / Self Care | Payer: Medicare Other | Source: Ambulatory Visit | Attending: Gastroenterology | Admitting: Gastroenterology

## 2013-09-03 DIAGNOSIS — R194 Change in bowel habit: Secondary | ICD-10-CM

## 2013-09-03 DIAGNOSIS — R140 Abdominal distension (gaseous): Secondary | ICD-10-CM

## 2013-09-03 DIAGNOSIS — K4090 Unilateral inguinal hernia, without obstruction or gangrene, not specified as recurrent: Secondary | ICD-10-CM | POA: Diagnosis not present

## 2013-09-03 DIAGNOSIS — R109 Unspecified abdominal pain: Secondary | ICD-10-CM

## 2013-09-03 DIAGNOSIS — K5730 Diverticulosis of large intestine without perforation or abscess without bleeding: Secondary | ICD-10-CM | POA: Diagnosis not present

## 2013-09-03 MED ORDER — IOHEXOL 300 MG/ML  SOLN
100.0000 mL | Freq: Once | INTRAMUSCULAR | Status: AC | PRN
  Administered 2013-09-03: 100 mL via INTRAVENOUS

## 2013-09-13 DIAGNOSIS — H2510 Age-related nuclear cataract, unspecified eye: Secondary | ICD-10-CM | POA: Diagnosis not present

## 2013-09-13 DIAGNOSIS — H25019 Cortical age-related cataract, unspecified eye: Secondary | ICD-10-CM | POA: Diagnosis not present

## 2013-09-13 DIAGNOSIS — H2589 Other age-related cataract: Secondary | ICD-10-CM | POA: Diagnosis not present

## 2013-09-21 DIAGNOSIS — Z5181 Encounter for therapeutic drug level monitoring: Secondary | ICD-10-CM | POA: Diagnosis not present

## 2013-09-21 DIAGNOSIS — K5900 Constipation, unspecified: Secondary | ICD-10-CM | POA: Diagnosis not present

## 2013-09-21 DIAGNOSIS — F341 Dysthymic disorder: Secondary | ICD-10-CM | POA: Diagnosis not present

## 2013-09-21 DIAGNOSIS — I10 Essential (primary) hypertension: Secondary | ICD-10-CM | POA: Diagnosis not present

## 2013-09-21 DIAGNOSIS — L84 Corns and callosities: Secondary | ICD-10-CM | POA: Diagnosis not present

## 2013-10-04 DIAGNOSIS — K5520 Angiodysplasia of colon without hemorrhage: Secondary | ICD-10-CM | POA: Diagnosis not present

## 2013-10-04 DIAGNOSIS — D126 Benign neoplasm of colon, unspecified: Secondary | ICD-10-CM | POA: Diagnosis not present

## 2013-10-04 DIAGNOSIS — K2970 Gastritis, unspecified, without bleeding: Secondary | ICD-10-CM | POA: Diagnosis not present

## 2013-10-04 DIAGNOSIS — R198 Other specified symptoms and signs involving the digestive system and abdomen: Secondary | ICD-10-CM | POA: Diagnosis not present

## 2013-10-04 DIAGNOSIS — K319 Disease of stomach and duodenum, unspecified: Secondary | ICD-10-CM | POA: Diagnosis not present

## 2013-10-04 DIAGNOSIS — K2990 Gastroduodenitis, unspecified, without bleeding: Secondary | ICD-10-CM | POA: Diagnosis not present

## 2013-10-04 DIAGNOSIS — D131 Benign neoplasm of stomach: Secondary | ICD-10-CM | POA: Diagnosis not present

## 2013-10-04 DIAGNOSIS — K2960 Other gastritis without bleeding: Secondary | ICD-10-CM | POA: Diagnosis not present

## 2013-10-04 DIAGNOSIS — K648 Other hemorrhoids: Secondary | ICD-10-CM | POA: Diagnosis not present

## 2013-10-04 DIAGNOSIS — K6289 Other specified diseases of anus and rectum: Secondary | ICD-10-CM | POA: Diagnosis not present

## 2013-10-04 DIAGNOSIS — R1084 Generalized abdominal pain: Secondary | ICD-10-CM | POA: Diagnosis not present

## 2013-10-05 DIAGNOSIS — I10 Essential (primary) hypertension: Secondary | ICD-10-CM | POA: Diagnosis not present

## 2013-10-05 DIAGNOSIS — Z5181 Encounter for therapeutic drug level monitoring: Secondary | ICD-10-CM | POA: Diagnosis not present

## 2013-10-28 DIAGNOSIS — Z961 Presence of intraocular lens: Secondary | ICD-10-CM | POA: Diagnosis not present

## 2013-11-17 DIAGNOSIS — R0789 Other chest pain: Secondary | ICD-10-CM | POA: Diagnosis not present

## 2013-11-17 DIAGNOSIS — E039 Hypothyroidism, unspecified: Secondary | ICD-10-CM | POA: Diagnosis not present

## 2013-11-17 DIAGNOSIS — F341 Dysthymic disorder: Secondary | ICD-10-CM | POA: Diagnosis not present

## 2013-11-17 DIAGNOSIS — I10 Essential (primary) hypertension: Secondary | ICD-10-CM | POA: Diagnosis not present

## 2014-01-04 DIAGNOSIS — Z9181 History of falling: Secondary | ICD-10-CM | POA: Diagnosis not present

## 2014-01-04 DIAGNOSIS — F339 Major depressive disorder, recurrent, unspecified: Secondary | ICD-10-CM | POA: Diagnosis not present

## 2014-01-04 DIAGNOSIS — I10 Essential (primary) hypertension: Secondary | ICD-10-CM | POA: Diagnosis not present

## 2014-01-04 DIAGNOSIS — F411 Generalized anxiety disorder: Secondary | ICD-10-CM | POA: Diagnosis not present

## 2014-01-04 DIAGNOSIS — E559 Vitamin D deficiency, unspecified: Secondary | ICD-10-CM | POA: Diagnosis not present

## 2014-01-04 DIAGNOSIS — H6980 Other specified disorders of Eustachian tube, unspecified ear: Secondary | ICD-10-CM | POA: Diagnosis not present

## 2014-01-11 DIAGNOSIS — I889 Nonspecific lymphadenitis, unspecified: Secondary | ICD-10-CM | POA: Diagnosis not present

## 2014-01-11 DIAGNOSIS — J069 Acute upper respiratory infection, unspecified: Secondary | ICD-10-CM | POA: Diagnosis not present

## 2014-01-11 DIAGNOSIS — J019 Acute sinusitis, unspecified: Secondary | ICD-10-CM | POA: Diagnosis not present

## 2014-01-27 DIAGNOSIS — Z23 Encounter for immunization: Secondary | ICD-10-CM | POA: Diagnosis not present

## 2014-02-24 DIAGNOSIS — M25569 Pain in unspecified knee: Secondary | ICD-10-CM | POA: Diagnosis not present

## 2014-03-03 ENCOUNTER — Other Ambulatory Visit: Payer: Self-pay | Admitting: Cardiology

## 2014-03-14 DIAGNOSIS — E039 Hypothyroidism, unspecified: Secondary | ICD-10-CM | POA: Diagnosis not present

## 2014-04-05 ENCOUNTER — Other Ambulatory Visit: Payer: Self-pay | Admitting: Internal Medicine

## 2014-04-05 ENCOUNTER — Ambulatory Visit
Admission: RE | Admit: 2014-04-05 | Discharge: 2014-04-05 | Disposition: A | Discharge status: Home / Self Care | Payer: Medicare Other | Source: Ambulatory Visit | Attending: Internal Medicine | Admitting: Internal Medicine

## 2014-04-05 DIAGNOSIS — M542 Cervicalgia: Secondary | ICD-10-CM

## 2014-04-05 DIAGNOSIS — E049 Nontoxic goiter, unspecified: Secondary | ICD-10-CM

## 2014-04-05 DIAGNOSIS — E063 Autoimmune thyroiditis: Secondary | ICD-10-CM | POA: Diagnosis not present

## 2014-04-05 DIAGNOSIS — E041 Nontoxic single thyroid nodule: Secondary | ICD-10-CM | POA: Diagnosis not present

## 2014-04-05 DIAGNOSIS — E039 Hypothyroidism, unspecified: Secondary | ICD-10-CM | POA: Diagnosis not present

## 2014-04-07 DIAGNOSIS — S83289A Other tear of lateral meniscus, current injury, unspecified knee, initial encounter: Secondary | ICD-10-CM | POA: Diagnosis not present

## 2014-04-07 DIAGNOSIS — Z5189 Encounter for other specified aftercare: Secondary | ICD-10-CM | POA: Diagnosis not present

## 2014-04-07 DIAGNOSIS — M23329 Other meniscus derangements, posterior horn of medial meniscus, unspecified knee: Secondary | ICD-10-CM | POA: Diagnosis not present

## 2014-04-18 DIAGNOSIS — J310 Chronic rhinitis: Secondary | ICD-10-CM | POA: Diagnosis not present

## 2014-04-18 DIAGNOSIS — B91 Sequelae of poliomyelitis: Secondary | ICD-10-CM | POA: Diagnosis not present

## 2014-04-18 DIAGNOSIS — J342 Deviated nasal septum: Secondary | ICD-10-CM | POA: Diagnosis not present

## 2014-04-18 DIAGNOSIS — E041 Nontoxic single thyroid nodule: Secondary | ICD-10-CM | POA: Diagnosis not present

## 2014-04-19 ENCOUNTER — Other Ambulatory Visit: Payer: Self-pay | Admitting: Cardiology

## 2014-04-26 ENCOUNTER — Encounter: Payer: Self-pay | Admitting: *Deleted

## 2014-04-29 ENCOUNTER — Ambulatory Visit (INDEPENDENT_AMBULATORY_CARE_PROVIDER_SITE_OTHER): Payer: Medicare Other | Admitting: Cardiology

## 2014-04-29 ENCOUNTER — Ambulatory Visit: Payer: Medicare Other | Admitting: Cardiology

## 2014-04-29 ENCOUNTER — Encounter: Payer: Self-pay | Admitting: Cardiology

## 2014-04-29 VITALS — BP 120/60 | HR 72 | Ht 66.0 in | Wt 143.8 lb

## 2014-04-29 DIAGNOSIS — R0602 Shortness of breath: Secondary | ICD-10-CM | POA: Diagnosis not present

## 2014-04-29 DIAGNOSIS — I359 Nonrheumatic aortic valve disorder, unspecified: Secondary | ICD-10-CM | POA: Diagnosis not present

## 2014-04-29 DIAGNOSIS — I350 Nonrheumatic aortic (valve) stenosis: Secondary | ICD-10-CM

## 2014-04-29 MED ORDER — HYDROCHLOROTHIAZIDE 12.5 MG PO CAPS: 12.5000 mg | ORAL_CAPSULE | Freq: Every day | ORAL | Status: DC

## 2014-04-29 NOTE — Progress Notes (Signed)
HPI The patient presents as a new patient. She has a history of a bicuspid aortic valve.  However, at my initial appointment with her with a followup echo I was unable to confirm bicuspid valve and she has no AS.  She does have dyspnea but has muscle weakness secondary to polio.  She continues to have DOE with household activities.  However, this is at baseline.   The patient denies any new symptoms such as chest discomfort, neck or arm discomfort. There has been no PND or orthopnea. There have been no reported palpitations, presyncope or syncope.    Allergies  Allergen Reactions  . Azithromycin   . Clindamycin/Lincomycin   . Eggs Or Egg-Derived Products   . Fenofibrate     Current Outpatient Prescriptions  Medication Sig Dispense Refill  . B Complex Vitamins (VITAMIN B COMPLEX PO) Take 1 tablet by mouth daily.      . benazepril (LOTENSIN) 10 MG tablet Take 10 mg by mouth daily.      . citalopram (CELEXA) 20 MG tablet Take 20 mg by mouth daily.      . hydrochlorothiazide (MICROZIDE) 12.5 MG capsule Take 1 capsule (12.5 mg total) by mouth daily.  90 capsule  3  . levothyroxine (SYNTHROID, LEVOTHROID) 112 MCG tablet Take 112 mcg by mouth daily.      Marland Kitchen LORazepam (ATIVAN) 0.5 MG tablet Take 0.5 mg by mouth 2 (two) times daily as needed. For sleep/anxiety.      . Multiple Vitamin (MULTIVITAMIN WITH MINERALS) TABS Take 1 tablet by mouth daily.      Marland Kitchen amLODipine (NORVASC) 5 MG tablet Take 5 mg by mouth daily.      . Ascorbic Acid (VITAMIN C PO) Take 1 tablet by mouth daily.      . Calcium Carbonate-Vitamin D (CALCIUM + D PO) Take 1 tablet by mouth daily.      Marland Kitchen docusate sodium (COLACE) 100 MG capsule Take 100 mg by mouth 2 (two) times daily as needed. For constipation.       No current facility-administered medications for this visit.    Past Medical History  Diagnosis Date  . Polio   . Hypertension   . Bicuspid aortic valve   . Thyroid disease     Past Surgical History  Procedure  Laterality Date  . Hernia repair    . Joint replacement      left hip  . Tonsillectomy    . Breast surgery    . Radical vaginal hysterectomy  1993  . Knee surgery      meniscus  . Bunionectomy    . Hammer toe surgery      ROS:  Positive for fatigue, difficulty swallowing, diarrhea constipation. Otherwise as stated in the HPI and negative for all other systems.  PHYSICAL EXAM BP 120/60  Pulse 72  Ht 5\' 6"  (1.676 m)  Wt 143 lb 13 oz (65.233 kg)  BMI 23.22 kg/m2 GENERAL:  Well appearing NECK:  No jugular venous distention, waveform within normal limits, carotid upstroke brisk and symmetric, no bruits, no thyromegaly LUNGS:  Clear to auscultation bilaterally CHEST:  Unremarkable HEART:  PMI not displaced or sustained,S1 and S2 within normal limits, no S3, no S4, no clicks, no rubs, no murmur ABD:  Flat, positive bowel sounds normal in frequency in pitch, no bruits, no rebound, no guarding, no midline pulsatile mass, no hepatomegaly, no splenomegaly EXT:  2 plus pulses upper, no edema, no cyanosis no clubbing, right leg is smaller  secondary to polio, diminished dorsalis pedis and posterior tibials bilaterally   EKG:  Sinus rhythm, rate 72, axis within normal limits, intervals within normal limits, no acute ST-T wave changes. 04/29/2014  ASSESSMENT AND PLAN  AORTIC STENOSIS:  She might have a bicuspid valve but she has not had AS.  She does have dyspnea and I will check a BNP level.  If this is not elevated then no further work up is planed   HTN:  Her BP is now well controlled. She's tolerating the HCTZ.    DYSPNEA:   As above.    PVD:  She reports a history of this and does have reduced pulses.  She will continue with risk reduction.

## 2014-04-29 NOTE — Patient Instructions (Signed)
Your physician recommends that you schedule a follow-up appointment in: one year with Dr. Percival Spanish  Have your labs done as soon as you can ; you can have it done downstairs today

## 2014-04-30 LAB — BRAIN NATRIURETIC PEPTIDE: Brain Natriuretic Peptide: 29.6 pg/mL (ref 0.0–100.0)

## 2014-05-03 DIAGNOSIS — F341 Dysthymic disorder: Secondary | ICD-10-CM | POA: Diagnosis not present

## 2014-05-03 DIAGNOSIS — E559 Vitamin D deficiency, unspecified: Secondary | ICD-10-CM | POA: Diagnosis not present

## 2014-05-03 DIAGNOSIS — I10 Essential (primary) hypertension: Secondary | ICD-10-CM | POA: Diagnosis not present

## 2014-05-03 DIAGNOSIS — Z9181 History of falling: Secondary | ICD-10-CM | POA: Diagnosis not present

## 2014-05-06 DIAGNOSIS — M25559 Pain in unspecified hip: Secondary | ICD-10-CM | POA: Diagnosis not present

## 2014-05-06 NOTE — Progress Notes (Signed)
Pt. Informed of her labs

## 2014-05-13 DIAGNOSIS — H2589 Other age-related cataract: Secondary | ICD-10-CM | POA: Diagnosis not present

## 2014-05-13 DIAGNOSIS — D3130 Benign neoplasm of unspecified choroid: Secondary | ICD-10-CM | POA: Diagnosis not present

## 2014-05-13 DIAGNOSIS — H04129 Dry eye syndrome of unspecified lacrimal gland: Secondary | ICD-10-CM | POA: Diagnosis not present

## 2014-05-13 DIAGNOSIS — Z961 Presence of intraocular lens: Secondary | ICD-10-CM | POA: Diagnosis not present

## 2014-05-13 DIAGNOSIS — H1045 Other chronic allergic conjunctivitis: Secondary | ICD-10-CM | POA: Diagnosis not present

## 2014-05-13 DIAGNOSIS — H532 Diplopia: Secondary | ICD-10-CM | POA: Diagnosis not present

## 2014-05-13 DIAGNOSIS — H26499 Other secondary cataract, unspecified eye: Secondary | ICD-10-CM | POA: Diagnosis not present

## 2014-05-19 DIAGNOSIS — J309 Allergic rhinitis, unspecified: Secondary | ICD-10-CM | POA: Diagnosis not present

## 2014-06-03 DIAGNOSIS — H2510 Age-related nuclear cataract, unspecified eye: Secondary | ICD-10-CM | POA: Diagnosis not present

## 2014-06-13 DIAGNOSIS — H2512 Age-related nuclear cataract, left eye: Secondary | ICD-10-CM | POA: Diagnosis not present

## 2014-06-13 DIAGNOSIS — H25042 Posterior subcapsular polar age-related cataract, left eye: Secondary | ICD-10-CM | POA: Diagnosis not present

## 2014-06-13 DIAGNOSIS — H2511 Age-related nuclear cataract, right eye: Secondary | ICD-10-CM | POA: Diagnosis not present

## 2014-06-20 DIAGNOSIS — M545 Low back pain: Secondary | ICD-10-CM | POA: Diagnosis not present

## 2014-06-20 DIAGNOSIS — M47816 Spondylosis without myelopathy or radiculopathy, lumbar region: Secondary | ICD-10-CM | POA: Diagnosis not present

## 2014-07-04 DIAGNOSIS — M47816 Spondylosis without myelopathy or radiculopathy, lumbar region: Secondary | ICD-10-CM | POA: Diagnosis not present

## 2014-07-04 DIAGNOSIS — M545 Low back pain: Secondary | ICD-10-CM | POA: Diagnosis not present

## 2014-07-04 DIAGNOSIS — M6281 Muscle weakness (generalized): Secondary | ICD-10-CM | POA: Diagnosis not present

## 2014-07-18 DIAGNOSIS — M6281 Muscle weakness (generalized): Secondary | ICD-10-CM | POA: Diagnosis not present

## 2014-07-18 DIAGNOSIS — M545 Low back pain: Secondary | ICD-10-CM | POA: Diagnosis not present

## 2014-07-18 DIAGNOSIS — M47816 Spondylosis without myelopathy or radiculopathy, lumbar region: Secondary | ICD-10-CM | POA: Diagnosis not present

## 2014-07-22 DIAGNOSIS — M545 Low back pain: Secondary | ICD-10-CM | POA: Diagnosis not present

## 2014-07-22 DIAGNOSIS — M6281 Muscle weakness (generalized): Secondary | ICD-10-CM | POA: Diagnosis not present

## 2014-07-22 DIAGNOSIS — M47816 Spondylosis without myelopathy or radiculopathy, lumbar region: Secondary | ICD-10-CM | POA: Diagnosis not present

## 2014-07-26 DIAGNOSIS — M25562 Pain in left knee: Secondary | ICD-10-CM | POA: Diagnosis not present

## 2014-07-26 DIAGNOSIS — M179 Osteoarthritis of knee, unspecified: Secondary | ICD-10-CM | POA: Diagnosis not present

## 2014-08-12 DIAGNOSIS — M6281 Muscle weakness (generalized): Secondary | ICD-10-CM | POA: Diagnosis not present

## 2014-08-12 DIAGNOSIS — M2012 Hallux valgus (acquired), left foot: Secondary | ICD-10-CM | POA: Diagnosis not present

## 2014-08-12 DIAGNOSIS — M1712 Unilateral primary osteoarthritis, left knee: Secondary | ICD-10-CM | POA: Diagnosis not present

## 2014-08-26 DIAGNOSIS — M545 Low back pain: Secondary | ICD-10-CM | POA: Diagnosis not present

## 2014-08-26 DIAGNOSIS — M539 Dorsopathy, unspecified: Secondary | ICD-10-CM | POA: Diagnosis not present

## 2014-09-14 DIAGNOSIS — M1712 Unilateral primary osteoarthritis, left knee: Secondary | ICD-10-CM | POA: Diagnosis not present

## 2014-10-26 DIAGNOSIS — M1712 Unilateral primary osteoarthritis, left knee: Secondary | ICD-10-CM | POA: Diagnosis not present

## 2014-10-26 DIAGNOSIS — M7542 Impingement syndrome of left shoulder: Secondary | ICD-10-CM | POA: Diagnosis not present

## 2014-11-02 DIAGNOSIS — F419 Anxiety disorder, unspecified: Secondary | ICD-10-CM | POA: Diagnosis not present

## 2014-11-02 DIAGNOSIS — I10 Essential (primary) hypertension: Secondary | ICD-10-CM | POA: Diagnosis not present

## 2014-11-02 DIAGNOSIS — E785 Hyperlipidemia, unspecified: Secondary | ICD-10-CM | POA: Diagnosis not present

## 2014-11-02 DIAGNOSIS — R0609 Other forms of dyspnea: Secondary | ICD-10-CM | POA: Diagnosis not present

## 2014-11-02 DIAGNOSIS — F339 Major depressive disorder, recurrent, unspecified: Secondary | ICD-10-CM | POA: Diagnosis not present

## 2014-11-02 DIAGNOSIS — E559 Vitamin D deficiency, unspecified: Secondary | ICD-10-CM | POA: Diagnosis not present

## 2014-11-10 ENCOUNTER — Other Ambulatory Visit: Payer: Self-pay | Admitting: Orthopaedic Surgery

## 2014-12-08 ENCOUNTER — Other Ambulatory Visit (HOSPITAL_COMMUNITY): Payer: Self-pay | Admitting: *Deleted

## 2014-12-08 NOTE — Pre-Procedure Instructions (Signed)
Tylie Golonka  12/08/2014   Your procedure is scheduled on:  Tuesday, December 20, 2014 at 7:30 AM.   Report to [facility] Entrance "A" Admitting Office at 5:30 AM.   Call this number if you have problems the morning of surgery: [phone]               Any questions prior to day of surgery, please call [phone] between 8 & 4 PM.    Remember:   Do not eat food or drink liquids after midnight Monday, 12/19/14.    Take these medicines the morning of surgery with A SIP OF WATER: Citalopram (Celexa), Levothyroxine (Synthroid), Oxycodone - if needed   Do not wear jewelry, make-up or nail polish.  Do not wear lotions, powders, or perfumes. You may wear deodorant.  Do not shave 48 hours prior to surgery.   Do not bring valuables to the hospital.  [facility] is not responsible                  for any belongings or valuables.               Contacts, dentures or bridgework may not be worn into surgery.  Leave suitcase in the car. After surgery it may be brought to your room.  For patients admitted to the hospital, discharge time is determined by your                treatment team.                 Special Instructions: See "Preparing for Surgery" Instruction sheet.    Please read over the following fact sheets that you were given: Pain Booklet, Coughing and Deep Breathing, Blood Transfusion Information, MRSA Information and Surgical Site Infection Prevention

## 2014-12-09 ENCOUNTER — Encounter (HOSPITAL_COMMUNITY)
Admission: RE | Admit: 2014-12-09 | Discharge: 2014-12-09 | Disposition: A | Discharge status: Home / Self Care | Payer: Medicare Other | Source: Ambulatory Visit | Attending: Orthopaedic Surgery | Admitting: Orthopaedic Surgery

## 2014-12-09 ENCOUNTER — Encounter (HOSPITAL_COMMUNITY): Payer: Self-pay

## 2014-12-09 DIAGNOSIS — Z01818 Encounter for other preprocedural examination: Secondary | ICD-10-CM | POA: Diagnosis not present

## 2014-12-09 DIAGNOSIS — I10 Essential (primary) hypertension: Secondary | ICD-10-CM | POA: Insufficient documentation

## 2014-12-09 DIAGNOSIS — M25562 Pain in left knee: Secondary | ICD-10-CM | POA: Diagnosis not present

## 2014-12-09 DIAGNOSIS — Z01812 Encounter for preprocedural laboratory examination: Secondary | ICD-10-CM | POA: Insufficient documentation

## 2014-12-09 DIAGNOSIS — M25662 Stiffness of left knee, not elsewhere classified: Secondary | ICD-10-CM | POA: Diagnosis not present

## 2014-12-09 HISTORY — DX: Hypothyroidism, unspecified: E03.9

## 2014-12-09 HISTORY — DX: Anxiety disorder, unspecified: F41.9

## 2014-12-09 HISTORY — DX: Major depressive disorder, single episode, unspecified: F32.9

## 2014-12-09 HISTORY — DX: Depression, unspecified: F32.A

## 2014-12-09 HISTORY — DX: Unspecified osteoarthritis, unspecified site: M19.90

## 2014-12-09 LAB — URINALYSIS, ROUTINE W REFLEX MICROSCOPIC
BILIRUBIN URINE: NEGATIVE
Glucose, UA: NEGATIVE mg/dL
KETONES UR: NEGATIVE mg/dL
LEUKOCYTES UA: NEGATIVE
NITRITE: NEGATIVE
PROTEIN: NEGATIVE mg/dL
Specific Gravity, Urine: 1.01 (ref 1.005–1.030)
Urobilinogen, UA: 0.2 mg/dL (ref 0.0–1.0)
pH: 6.5 (ref 5.0–8.0)

## 2014-12-09 LAB — PROTIME-INR
INR: 0.94 (ref 0.00–1.49)
Prothrombin Time: 12.7 seconds (ref 11.6–15.2)

## 2014-12-09 LAB — CBC WITH DIFFERENTIAL/PLATELET
BASOS PCT: 1 % (ref 0–1)
Basophils Absolute: 0.1 10*3/uL (ref 0.0–0.1)
EOS ABS: 0.2 10*3/uL (ref 0.0–0.7)
EOS PCT: 3 % (ref 0–5)
HEMATOCRIT: 41.9 % (ref 36.0–46.0)
HEMOGLOBIN: 13.9 g/dL (ref 12.0–15.0)
Lymphocytes Relative: 22 % (ref 12–46)
Lymphs Abs: 1.4 10*3/uL (ref 0.7–4.0)
MCH: 29.3 pg (ref 26.0–34.0)
MCHC: 33.2 g/dL (ref 30.0–36.0)
MCV: 88.2 fL (ref 78.0–100.0)
MONO ABS: 0.4 10*3/uL (ref 0.1–1.0)
MONOS PCT: 6 % (ref 3–12)
NEUTROS ABS: 4.3 10*3/uL (ref 1.7–7.7)
Neutrophils Relative %: 68 % (ref 43–77)
Platelets: 298 10*3/uL (ref 150–400)
RBC: 4.75 MIL/uL (ref 3.87–5.11)
RDW: 13.2 % (ref 11.5–15.5)
WBC: 6.4 10*3/uL (ref 4.0–10.5)

## 2014-12-09 LAB — APTT: APTT: 32 s (ref 24–37)

## 2014-12-09 LAB — BASIC METABOLIC PANEL
ANION GAP: 9 (ref 5–15)
BUN: 10 mg/dL (ref 6–23)
CO2: 27 mmol/L (ref 19–32)
CREATININE: 0.56 mg/dL (ref 0.50–1.10)
Calcium: 9.7 mg/dL (ref 8.4–10.5)
Chloride: 102 mmol/L (ref 96–112)
GFR calc non Af Amer: >90 mL/min (ref >90)
Glucose, Bld: 78 mg/dL (ref 70–99)
POTASSIUM: 3.6 mmol/L (ref 3.5–5.1)
SODIUM: 138 mmol/L (ref 135–145)

## 2014-12-09 LAB — URINE MICROSCOPIC-ADD ON

## 2014-12-09 LAB — SURGICAL PCR SCREEN
MRSA, PCR: NEGATIVE
Staphylococcus aureus: NEGATIVE

## 2014-12-09 NOTE — Progress Notes (Addendum)
Anesthesia Chart Review:  Pt is 74 year old female scheduled for L total knee arthroplasty on 12/20/2014 with Dr. Rhona Raider.   Cardiologist is Dr. Percival Spanish. Last seen 04/29/2014.   PMH includes: HTN, possible bicuspid aortic valve, PVD hypothyroidism, polio. Never smoker. BMI 23.   Preoperative labs reviewed.    Chest x-ray reviewed. No edema or consolidation.   EKG 04/29/2014: NSR. Nonspecific ST abnormality.  Echo 02/17/2013: - Left ventricle: The cavity size was normal. Wall thickness was normal. Systolic function was normal. The estimated ejection fraction was in the range of 60% to 65%. Wall motion was normal; there were no regional wall motion abnormalities. Doppler parameters are consistent with abnormal left ventricular relaxation (grade 1 diastolic dysfunction). - Aortic valve: Mildly calcified leaflets, possibly bicuspid but poorly visualized. There was no stenosis. No regurgitation. - Mitral valve: No significant regurgitation. - Left atrium: The atrium was mildly dilated. - Right ventricle: The cavity size was normal. Systolic function was normal. - Pulmonary arteries: No complete TR doppler jet so unable to estimate PA systolic pressure. - Inferior vena cava: The vessel was normal in size; the respirophasic diameter changes were in the normal range (= 50%); findings are consistent with normal central venous pressure.  Cardiac cath 06/15/2010: 1. Mild nonobstructive coronary artery disease. 2.Preserved left ventricular function. 3.No evidence of aortic stenosis. 4.Right radial access. 5.Recommend continued medical management.  If no changes, I anticipate pt can proceed with surgery as scheduled.   Willeen Cass, FNP-BC [facility]/Anesthesiology Phone: [phone] 12/09/2014 12:43 PM

## 2014-12-16 NOTE — H&P (Signed)
TOTAL KNEE ADMISSION H&P  Patient is being admitted for left total knee arthroplasty.  Subjective:  Chief Complaint:left knee pain.  HPI: Crystal Levy, 74 y.o. female, has a history of pain and functional disability in the left knee due to arthritis and has failed non-surgical conservative treatments for greater than 12 weeks to includeNSAID's and/or analgesics, corticosteriod injections, supervised PT with diminished ADL's post treatment, weight reduction as appropriate and activity modification.  Onset of symptoms was gradual, starting 7 years ago with gradually worsening course since that time. The patient noted prior procedures on the knee to include  arthroscopy on the left knee(s).  Patient currently rates pain in the left knee(s) at 9 out of 10 with activity. Patient has night pain, worsening of pain with activity and weight bearing, pain that interferes with activities of daily living, pain with passive range of motion and crepitus.  Patient has evidence of periarticular osteophytes and joint space narrowing by imaging studies. This patient has had no. There is no active infection.  Patient Active Problem List   Diagnosis Date Noted  . HTN (hypertension) 04/29/2013  . Aortic stenosis 02/06/2013   Past Medical History  Diagnosis Date  . Polio   . Hypertension   . Bicuspid aortic valve   . Thyroid disease   . Hypothyroidism   . Anxiety   . Depression   . Arthritis     Past Surgical History  Procedure Laterality Date  . Hernia repair    . Joint replacement      left hip  . Tonsillectomy    . Breast surgery    . Radical vaginal hysterectomy  1993  . Knee surgery      meniscus  . Bunionectomy    . Hammer toe surgery    . Breast enhancement surgery    . Abdominal hysterectomy    . Eye surgery    . Cardiac catheterization      No prescriptions prior to admission   Allergies  Allergen Reactions  . Penicillins Rash  . Azithromycin   . Clindamycin/Lincomycin   .  Eggs Or Egg-Derived Products   . Fenofibrate   . Sulfa Antibiotics   . Sulfamethoxazole-Trimethoprim     History  Substance Use Topics  . Smoking status: Never Smoker   . Smokeless tobacco: Not on file     Comment: Rare smoker in the distant past.   . Alcohol Use: 0.0 oz/week    0 Glasses of wine per week     Comment: wine    Family History  Problem Relation Age of Onset  . CAD Mother 41  . Aortic stenosis Mother      Review of Systems  Musculoskeletal:       Severe knee pain limiting ability to ambulate comfortably    Objective:  Physical Exam  Constitutional: She appears well-developed.  HENT:  Head: Normocephalic.  Eyes: Pupils are equal, round, and reactive to light.  Neck: Normal range of motion.  Cardiovascular: Normal rate.   Respiratory: Effort normal.  GI: Soft.  Musculoskeletal:  Left knee motion 5-1 10.  No effusion, 1+ crepitation.  Pain medial and lateral joint lines.  Also patellofemoral joint line painful.  Sensory motor function normal.  Calf is negative for DVT  Neurological: She is alert.  Skin: Skin is warm.  Psychiatric: She has a normal mood and affect.    Vital signs in last 24 hours:    Labs:   Estimated body mass index is 23.[place]  kg/(m^2) as calculated from the following:   Height as of 04/29/14: 5\' 6"  (1.676 m).   Weight as of 04/29/14: 65.233 kg (143 lb 13 oz).   Imaging Review Plain radiographs demonstrate severe degenerative joint disease of the left knee(s). The overall alignment isneutral. The bone quality appears to be good for age and reported activity level.  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 The treatment options including medical  management, injection therapy arthroscopy and arthroplasty were discussed at length. The risks and benefits of total knee arthroplasty were presented and reviewed. The risks due to aseptic loosening, infection, stiffness, patella tracking problems, thromboembolic complications and other imponderables were discussed. The patient acknowledged the explanation, agreed to proceed with the plan and consent was signed. Patient is being admitted for inpatient treatment for surgery, pain control, PT, OT, prophylactic antibiotics, VTE prophylaxis, progressive ambulation and ADL's and discharge planning. The patient is planning to be discharged to skilled nursing facility

## 2014-12-19 MED ORDER — LACTATED RINGERS IV SOLN: INTRAVENOUS | Status: DC

## 2014-12-19 MED ORDER — CHLORHEXIDINE GLUCONATE 4 % EX LIQD
60.0000 mL | Freq: Once | CUTANEOUS | Status: DC
  Filled 2014-12-19: qty 60

## 2014-12-20 ENCOUNTER — Inpatient Hospital Stay (HOSPITAL_COMMUNITY)
Admission: RE | Admit: 2014-12-20 | Discharge: 2014-12-22 | DRG: 470 | Disposition: A | Discharge status: Skilled Nursing Facility | Payer: Medicare Other | Source: Ambulatory Visit | Attending: Orthopaedic Surgery | Admitting: Orthopaedic Surgery

## 2014-12-20 ENCOUNTER — Inpatient Hospital Stay (HOSPITAL_COMMUNITY): Payer: Medicare Other | Admitting: Anesthesiology

## 2014-12-20 ENCOUNTER — Encounter (HOSPITAL_COMMUNITY): Payer: Self-pay | Admitting: Surgery

## 2014-12-20 ENCOUNTER — Encounter (HOSPITAL_COMMUNITY)
Admission: RE | Disposition: A | Discharge status: Skilled Nursing Facility | Payer: Self-pay | Source: Ambulatory Visit | Attending: Orthopaedic Surgery

## 2014-12-20 ENCOUNTER — Inpatient Hospital Stay (HOSPITAL_COMMUNITY): Payer: Medicare Other | Admitting: Emergency Medicine

## 2014-12-20 DIAGNOSIS — Z881 Allergy status to other antibiotic agents status: Secondary | ICD-10-CM

## 2014-12-20 DIAGNOSIS — Z96642 Presence of left artificial hip joint: Secondary | ICD-10-CM | POA: Diagnosis present

## 2014-12-20 DIAGNOSIS — Z882 Allergy status to sulfonamides status: Secondary | ICD-10-CM | POA: Diagnosis not present

## 2014-12-20 DIAGNOSIS — M6281 Muscle weakness (generalized): Secondary | ICD-10-CM | POA: Diagnosis not present

## 2014-12-20 DIAGNOSIS — Z91012 Allergy to eggs: Secondary | ICD-10-CM

## 2014-12-20 DIAGNOSIS — M1712 Unilateral primary osteoarthritis, left knee: Principal | ICD-10-CM | POA: Diagnosis present

## 2014-12-20 DIAGNOSIS — F419 Anxiety disorder, unspecified: Secondary | ICD-10-CM | POA: Diagnosis present

## 2014-12-20 DIAGNOSIS — Z96652 Presence of left artificial knee joint: Secondary | ICD-10-CM | POA: Diagnosis not present

## 2014-12-20 DIAGNOSIS — R2681 Unsteadiness on feet: Secondary | ICD-10-CM | POA: Diagnosis not present

## 2014-12-20 DIAGNOSIS — I10 Essential (primary) hypertension: Secondary | ICD-10-CM | POA: Diagnosis present

## 2014-12-20 DIAGNOSIS — I350 Nonrheumatic aortic (valve) stenosis: Secondary | ICD-10-CM | POA: Diagnosis present

## 2014-12-20 DIAGNOSIS — M25562 Pain in left knee: Secondary | ICD-10-CM | POA: Diagnosis not present

## 2014-12-20 DIAGNOSIS — M179 Osteoarthritis of knee, unspecified: Secondary | ICD-10-CM | POA: Diagnosis not present

## 2014-12-20 DIAGNOSIS — Z880 Allergy status to penicillin: Secondary | ICD-10-CM | POA: Diagnosis not present

## 2014-12-20 DIAGNOSIS — F329 Major depressive disorder, single episode, unspecified: Secondary | ICD-10-CM | POA: Diagnosis present

## 2014-12-20 DIAGNOSIS — M1710 Unilateral primary osteoarthritis, unspecified knee: Secondary | ICD-10-CM | POA: Diagnosis present

## 2014-12-20 DIAGNOSIS — M1990 Unspecified osteoarthritis, unspecified site: Secondary | ICD-10-CM | POA: Diagnosis not present

## 2014-12-20 DIAGNOSIS — E039 Hypothyroidism, unspecified: Secondary | ICD-10-CM | POA: Diagnosis present

## 2014-12-20 DIAGNOSIS — G8918 Other acute postprocedural pain: Secondary | ICD-10-CM | POA: Diagnosis not present

## 2014-12-20 DIAGNOSIS — Z90710 Acquired absence of both cervix and uterus: Secondary | ICD-10-CM

## 2014-12-20 DIAGNOSIS — R261 Paralytic gait: Secondary | ICD-10-CM | POA: Diagnosis not present

## 2014-12-20 DIAGNOSIS — Z471 Aftercare following joint replacement surgery: Secondary | ICD-10-CM | POA: Diagnosis not present

## 2014-12-20 HISTORY — PX: TOTAL KNEE ARTHROPLASTY: SHX125

## 2014-12-20 LAB — TYPE AND SCREEN
ABO/RH(D): O POS
ANTIBODY SCREEN: NEGATIVE

## 2014-12-20 LAB — ABO/RH: ABO/RH(D): O POS

## 2014-12-20 SURGERY — ARTHROPLASTY, KNEE, TOTAL
Anesthesia: Regional | Site: Knee | Laterality: Left

## 2014-12-20 MED ORDER — ALUM & MAG HYDROXIDE-SIMETH 200-200-20 MG/5ML PO SUSP: 30.0000 mL | ORAL | Status: DC | PRN

## 2014-12-20 MED ORDER — CEFAZOLIN SODIUM-DEXTROSE 2-3 GM-% IV SOLR
2.0000 g | Freq: Four times a day (QID) | INTRAVENOUS | Status: AC
  Administered 2014-12-20 (×2): 2 g via INTRAVENOUS
  Filled 2014-12-20 (×2): qty 50

## 2014-12-20 MED ORDER — DOCUSATE SODIUM 100 MG PO CAPS
100.0000 mg | ORAL_CAPSULE | Freq: Two times a day (BID) | ORAL | Status: DC
  Administered 2014-12-21 – 2014-12-22 (×3): 100 mg via ORAL
  Filled 2014-12-20 (×4): qty 1

## 2014-12-20 MED ORDER — ACETAMINOPHEN 650 MG RE SUPP: 650.0000 mg | Freq: Four times a day (QID) | RECTAL | Status: DC | PRN

## 2014-12-20 MED ORDER — ACETAMINOPHEN 160 MG/5ML PO SOLN
325.0000 mg | ORAL | Status: DC | PRN
  Filled 2014-12-20: qty 20.3

## 2014-12-20 MED ORDER — SODIUM CHLORIDE 0.9 % IR SOLN
Status: DC | PRN
  Administered 2014-12-20: 1000 mL
  Administered 2014-12-20: 3000 mL

## 2014-12-20 MED ORDER — TRANEXAMIC ACID 100 MG/ML IV SOLN
2000.0000 mg | INTRAVENOUS | Status: DC
  Filled 2014-12-20: qty 20

## 2014-12-20 MED ORDER — DIPHENHYDRAMINE HCL 12.5 MG/5ML PO ELIX: 12.5000 mg | ORAL_SOLUTION | ORAL | Status: DC | PRN

## 2014-12-20 MED ORDER — BENAZEPRIL HCL 10 MG PO TABS
10.0000 mg | ORAL_TABLET | Freq: Every day | ORAL | Status: DC
  Administered 2014-12-22: 10 mg via ORAL
  Filled 2014-12-20: qty 1
  Filled 2014-12-20: qty 0.5
  Filled 2014-12-20 (×2): qty 1

## 2014-12-20 MED ORDER — PHENOL 1.4 % MT LIQD
1.0000 | OROMUCOSAL | Status: DC | PRN
Start: 2014-12-20 — End: 2014-12-22

## 2014-12-20 MED ORDER — OXYCODONE HCL 5 MG PO TABS
5.0000 mg | ORAL_TABLET | ORAL | Status: DC | PRN
  Administered 2014-12-20 – 2014-12-22 (×11): 10 mg via ORAL
  Filled 2014-12-20 (×12): qty 2

## 2014-12-20 MED ORDER — FENTANYL CITRATE 0.05 MG/ML IJ SOLN
INTRAMUSCULAR | Status: AC
  Filled 2014-12-20: qty 5

## 2014-12-20 MED ORDER — BUPIVACAINE-EPINEPHRINE (PF) 0.5% -1:200000 IJ SOLN
INTRAMUSCULAR | Status: DC | PRN
  Administered 2014-12-20: 15 mL via PERINEURAL

## 2014-12-20 MED ORDER — LIDOCAINE HCL (CARDIAC) 20 MG/ML IV SOLN
INTRAVENOUS | Status: DC | PRN
  Administered 2014-12-20: 50 mg via INTRAVENOUS

## 2014-12-20 MED ORDER — ACETAMINOPHEN 325 MG PO TABS: 325.0000 mg | ORAL_TABLET | ORAL | Status: DC | PRN

## 2014-12-20 MED ORDER — METOCLOPRAMIDE HCL 5 MG/ML IJ SOLN: 5.0000 mg | Freq: Three times a day (TID) | INTRAMUSCULAR | Status: DC | PRN

## 2014-12-20 MED ORDER — CEFAZOLIN SODIUM-DEXTROSE 2-3 GM-% IV SOLR
INTRAVENOUS | Status: AC
  Filled 2014-12-20: qty 50

## 2014-12-20 MED ORDER — METHOCARBAMOL 500 MG PO TABS
500.0000 mg | ORAL_TABLET | Freq: Four times a day (QID) | ORAL | Status: DC | PRN
  Administered 2014-12-20 – 2014-12-22 (×4): 500 mg via ORAL
  Filled 2014-12-20 (×4): qty 1

## 2014-12-20 MED ORDER — MIDAZOLAM HCL 5 MG/5ML IJ SOLN
INTRAMUSCULAR | Status: DC | PRN
Start: 2014-12-20 — End: 2014-12-20
  Administered 2014-12-20 (×2): 1 mg via INTRAVENOUS

## 2014-12-20 MED ORDER — BISACODYL 5 MG PO TBEC: 5.0000 mg | DELAYED_RELEASE_TABLET | Freq: Every day | ORAL | Status: DC | PRN

## 2014-12-20 MED ORDER — ARTIFICIAL TEARS OP OINT
TOPICAL_OINTMENT | OPHTHALMIC | Status: AC
  Filled 2014-12-20: qty 3.5

## 2014-12-20 MED ORDER — OXYCODONE HCL 5 MG/5ML PO SOLN: 5.0000 mg | Freq: Once | ORAL | Status: DC | PRN

## 2014-12-20 MED ORDER — LACTATED RINGERS IV SOLN
INTRAVENOUS | Status: DC | PRN
  Administered 2014-12-20 (×2): via INTRAVENOUS

## 2014-12-20 MED ORDER — DEXAMETHASONE SODIUM PHOSPHATE 4 MG/ML IJ SOLN
INTRAMUSCULAR | Status: AC
  Filled 2014-12-20: qty 1

## 2014-12-20 MED ORDER — PROPOFOL 10 MG/ML IV BOLUS
INTRAVENOUS | Status: DC | PRN
  Administered 2014-12-20: 130 mg via INTRAVENOUS
  Administered 2014-12-20 (×2): 20 mg via INTRAVENOUS
  Administered 2014-12-20: 10 mg via INTRAVENOUS

## 2014-12-20 MED ORDER — ONDANSETRON HCL 4 MG/2ML IJ SOLN
4.0000 mg | Freq: Four times a day (QID) | INTRAMUSCULAR | Status: DC | PRN
  Administered 2014-12-21: 4 mg via INTRAVENOUS
  Filled 2014-12-20: qty 2

## 2014-12-20 MED ORDER — DEXAMETHASONE SODIUM PHOSPHATE 4 MG/ML IJ SOLN
INTRAMUSCULAR | Status: DC | PRN
  Administered 2014-12-20: 4 mg via INTRAVENOUS

## 2014-12-20 MED ORDER — NIACIN 250 MG PO TABS
250.0000 mg | ORAL_TABLET | Freq: Every day | ORAL | Status: DC
  Administered 2014-12-20: 200 mg via ORAL
  Administered 2014-12-21: 250 mg via ORAL
  Filled 2014-12-20 (×3): qty 1

## 2014-12-20 MED ORDER — HYDROMORPHONE HCL 1 MG/ML IJ SOLN
INTRAMUSCULAR | Status: AC
  Filled 2014-12-20: qty 1

## 2014-12-20 MED ORDER — LEVOTHYROXINE SODIUM 112 MCG PO TABS
112.0000 ug | ORAL_TABLET | Freq: Every day | ORAL | Status: DC
  Administered 2014-12-21 – 2014-12-22 (×2): 112 ug via ORAL
  Filled 2014-12-20 (×2): qty 1

## 2014-12-20 MED ORDER — LORAZEPAM 0.5 MG PO TABS: 0.5000 mg | ORAL_TABLET | Freq: Every evening | ORAL | Status: DC | PRN

## 2014-12-20 MED ORDER — HYDROMORPHONE HCL 1 MG/ML IJ SOLN
0.2500 mg | INTRAMUSCULAR | Status: DC | PRN
  Administered 2014-12-20 (×2): 0.25 mg via INTRAVENOUS
  Administered 2014-12-20 (×2): 0.5 mg via INTRAVENOUS

## 2014-12-20 MED ORDER — SODIUM CHLORIDE 0.9 % IJ SOLN
INTRAMUSCULAR | Status: DC | PRN
  Administered 2014-12-20: 20 mL

## 2014-12-20 MED ORDER — MENTHOL 3 MG MT LOZG: 1.0000 | LOZENGE | OROMUCOSAL | Status: DC | PRN

## 2014-12-20 MED ORDER — LACTATED RINGERS IV SOLN
INTRAVENOUS | Status: DC
  Administered 2014-12-20: 16:00:00 via INTRAVENOUS

## 2014-12-20 MED ORDER — METHOCARBAMOL 1000 MG/10ML IJ SOLN
500.0000 mg | Freq: Four times a day (QID) | INTRAMUSCULAR | Status: DC | PRN
  Filled 2014-12-20: qty 5

## 2014-12-20 MED ORDER — ASPIRIN EC 325 MG PO TBEC
325.0000 mg | DELAYED_RELEASE_TABLET | Freq: Two times a day (BID) | ORAL | Status: DC
  Administered 2014-12-21 – 2014-12-22 (×4): 325 mg via ORAL
  Filled 2014-12-20 (×4): qty 1

## 2014-12-20 MED ORDER — LIDOCAINE HCL (CARDIAC) 20 MG/ML IV SOLN
INTRAVENOUS | Status: AC
  Filled 2014-12-20: qty 5

## 2014-12-20 MED ORDER — METOCLOPRAMIDE HCL 5 MG PO TABS: 5.0000 mg | ORAL_TABLET | Freq: Three times a day (TID) | ORAL | Status: DC | PRN

## 2014-12-20 MED ORDER — PROPOFOL 10 MG/ML IV BOLUS
INTRAVENOUS | Status: AC
  Filled 2014-12-20: qty 20

## 2014-12-20 MED ORDER — ONDANSETRON HCL 4 MG/2ML IJ SOLN
INTRAMUSCULAR | Status: AC
  Filled 2014-12-20: qty 2

## 2014-12-20 MED ORDER — BUPIVACAINE LIPOSOME 1.3 % IJ SUSP
INTRAMUSCULAR | Status: DC | PRN
  Administered 2014-12-20: 20 mL

## 2014-12-20 MED ORDER — FENTANYL CITRATE 0.05 MG/ML IJ SOLN
INTRAMUSCULAR | Status: DC | PRN
  Administered 2014-12-20: 25 ug via INTRAVENOUS
  Administered 2014-12-20: 50 ug via INTRAVENOUS
  Administered 2014-12-20: 100 ug via INTRAVENOUS
  Administered 2014-12-20: 50 ug via INTRAVENOUS
  Administered 2014-12-20: 25 ug via INTRAVENOUS
  Administered 2014-12-20 (×2): 50 ug via INTRAVENOUS

## 2014-12-20 MED ORDER — BUPIVACAINE LIPOSOME 1.3 % IJ SUSP
20.0000 mL | INTRAMUSCULAR | Status: DC
  Filled 2014-12-20: qty 20

## 2014-12-20 MED ORDER — OXYCODONE HCL 5 MG PO TABS: 5.0000 mg | ORAL_TABLET | Freq: Once | ORAL | Status: DC | PRN

## 2014-12-20 MED ORDER — CEFAZOLIN SODIUM-DEXTROSE 2-3 GM-% IV SOLR
INTRAVENOUS | Status: DC | PRN
  Administered 2014-12-20: 2 g via INTRAVENOUS

## 2014-12-20 MED ORDER — ONDANSETRON HCL 4 MG/2ML IJ SOLN
INTRAMUSCULAR | Status: DC | PRN
  Administered 2014-12-20: 4 mg via INTRAVENOUS

## 2014-12-20 MED ORDER — MIDAZOLAM HCL 2 MG/2ML IJ SOLN
INTRAMUSCULAR | Status: AC
  Filled 2014-12-20: qty 2

## 2014-12-20 MED ORDER — HYDROMORPHONE HCL 1 MG/ML IJ SOLN
0.5000 mg | INTRAMUSCULAR | Status: DC | PRN
  Administered 2014-12-20 – 2014-12-21 (×7): 1 mg via INTRAVENOUS
  Filled 2014-12-20 (×8): qty 1

## 2014-12-20 MED ORDER — ONDANSETRON HCL 4 MG PO TABS: 4.0000 mg | ORAL_TABLET | Freq: Four times a day (QID) | ORAL | Status: DC | PRN

## 2014-12-20 MED ORDER — CITALOPRAM HYDROBROMIDE 20 MG PO TABS
20.0000 mg | ORAL_TABLET | Freq: Every day | ORAL | Status: DC
  Administered 2014-12-21 – 2014-12-22 (×2): 20 mg via ORAL
  Filled 2014-12-20 (×2): qty 1

## 2014-12-20 MED ORDER — ACETAMINOPHEN 325 MG PO TABS: 650.0000 mg | ORAL_TABLET | Freq: Four times a day (QID) | ORAL | Status: DC | PRN

## 2014-12-20 MED ORDER — HYDROCHLOROTHIAZIDE 12.5 MG PO CAPS
12.5000 mg | ORAL_CAPSULE | Freq: Every day | ORAL | Status: DC
  Administered 2014-12-21 – 2014-12-22 (×2): 12.5 mg via ORAL
  Filled 2014-12-20 (×2): qty 1

## 2014-12-20 MED ORDER — ARTIFICIAL TEARS OP OINT
TOPICAL_OINTMENT | OPHTHALMIC | Status: DC | PRN
  Administered 2014-12-20: 1 via OPHTHALMIC

## 2014-12-20 SURGICAL SUPPLY — 71 items
BAG DECANTER FOR FLEXI CONT (MISCELLANEOUS) ×3 IMPLANT
BANDAGE ELASTIC 4 VELCRO ST LF (GAUZE/BANDAGES/DRESSINGS) IMPLANT
BANDAGE ELASTIC 6 VELCRO ST LF (GAUZE/BANDAGES/DRESSINGS) ×3 IMPLANT
BANDAGE ESMARK 6X9 LF (GAUZE/BANDAGES/DRESSINGS) ×1 IMPLANT
BENZOIN TINCTURE PRP APPL 2/3 (GAUZE/BANDAGES/DRESSINGS) ×3 IMPLANT
BLADE SAGITTAL 25.0X1.19X90 (BLADE) IMPLANT
BLADE SAGITTAL 25.0X1.19X90MM (BLADE)
BLADE SAW SGTL 13.0X1.19X90.0M (BLADE) IMPLANT
BLADE SURG ROTATE 9660 (MISCELLANEOUS) IMPLANT
BNDG ELASTIC 6X10 VLCR STRL LF (GAUZE/BANDAGES/DRESSINGS) IMPLANT
BNDG ESMARK 6X9 LF (GAUZE/BANDAGES/DRESSINGS) ×3
BNDG GAUZE ELAST 4 BULKY (GAUZE/BANDAGES/DRESSINGS) ×6 IMPLANT
BOWL SMART MIX CTS (DISPOSABLE) ×3 IMPLANT
CAP KNEE TOTAL 3 SIGMA ×3 IMPLANT
CEMENT HV SMART SET (Cement) ×6 IMPLANT
CLOSURE WOUND 1/2 X4 (GAUZE/BANDAGES/DRESSINGS) ×1
COVER SURGICAL LIGHT HANDLE (MISCELLANEOUS) ×3 IMPLANT
CUFF TOURNIQUET SINGLE 34IN LL (TOURNIQUET CUFF) ×3 IMPLANT
CUFF TOURNIQUET SINGLE 44IN (TOURNIQUET CUFF) IMPLANT
DRAPE EXTREMITY T 121X128X90 (DRAPE) ×3 IMPLANT
DRAPE IMP U-DRAPE 54X76 (DRAPES) ×3 IMPLANT
DRAPE PROXIMA HALF (DRAPES) ×3 IMPLANT
DRAPE U-SHAPE 47X51 STRL (DRAPES) ×3 IMPLANT
DRSG ADAPTIC 3X8 NADH LF (GAUZE/BANDAGES/DRESSINGS) ×3 IMPLANT
DRSG PAD ABDOMINAL 8X10 ST (GAUZE/BANDAGES/DRESSINGS) ×3 IMPLANT
DURAPREP 26ML APPLICATOR (WOUND CARE) ×3 IMPLANT
ELECT REM PT RETURN 9FT ADLT (ELECTROSURGICAL) ×3
ELECTRODE REM PT RTRN 9FT ADLT (ELECTROSURGICAL) ×1 IMPLANT
GAUZE SPONGE 4X4 12PLY STRL (GAUZE/BANDAGES/DRESSINGS) ×3 IMPLANT
GLOVE BIO SURGEON STRL SZ8 (GLOVE) ×6 IMPLANT
GLOVE BIOGEL PI IND STRL 7.5 (GLOVE) ×1 IMPLANT
GLOVE BIOGEL PI IND STRL 8 (GLOVE) ×2 IMPLANT
GLOVE BIOGEL PI INDICATOR 7.5 (GLOVE) ×2
GLOVE BIOGEL PI INDICATOR 8 (GLOVE) ×4
GLOVE SURG SS PI 6.5 STRL IVOR (GLOVE) ×3 IMPLANT
GOWN STRL REUS W/ TWL LRG LVL3 (GOWN DISPOSABLE) ×1 IMPLANT
GOWN STRL REUS W/ TWL XL LVL3 (GOWN DISPOSABLE) ×2 IMPLANT
GOWN STRL REUS W/TWL LRG LVL3 (GOWN DISPOSABLE) ×2
GOWN STRL REUS W/TWL XL LVL3 (GOWN DISPOSABLE) ×4
HANDPIECE INTERPULSE COAX TIP (DISPOSABLE) ×2
HOOD PEEL AWAY FACE SHEILD DIS (HOOD) ×6 IMPLANT
IMMOBILIZER KNEE 20 (SOFTGOODS) IMPLANT
IMMOBILIZER KNEE 22 (SOFTGOODS) ×3 IMPLANT
IMMOBILIZER KNEE 22 UNIV (SOFTGOODS) ×3 IMPLANT
IMMOBILIZER KNEE 24 THIGH 36 (MISCELLANEOUS) IMPLANT
IMMOBILIZER KNEE 24 UNIV (MISCELLANEOUS)
KIT BASIN OR (CUSTOM PROCEDURE TRAY) ×3 IMPLANT
KIT ROOM TURNOVER OR (KITS) ×3 IMPLANT
MANIFOLD NEPTUNE II (INSTRUMENTS) ×3 IMPLANT
NEEDLE 22X1 1/2 (OR ONLY) (NEEDLE) ×3 IMPLANT
NEEDLE HYPO 21X1 ECLIPSE (NEEDLE) ×3 IMPLANT
NS IRRIG 1000ML POUR BTL (IV SOLUTION) ×3 IMPLANT
PACK TOTAL JOINT (CUSTOM PROCEDURE TRAY) ×3 IMPLANT
PACK UNIVERSAL I (CUSTOM PROCEDURE TRAY) ×3 IMPLANT
PAD ABD 8X10 STRL (GAUZE/BANDAGES/DRESSINGS) ×3 IMPLANT
PAD ARMBOARD 7.5X6 YLW CONV (MISCELLANEOUS) ×6 IMPLANT
SET HNDPC FAN SPRY TIP SCT (DISPOSABLE) ×1 IMPLANT
STAPLER VISISTAT 35W (STAPLE) IMPLANT
STRIP CLOSURE SKIN 1/2X4 (GAUZE/BANDAGES/DRESSINGS) ×2 IMPLANT
SUCTION FRAZIER TIP 10 FR DISP (SUCTIONS) IMPLANT
SUT MNCRL AB 3-0 PS2 18 (SUTURE) IMPLANT
SUT VIC AB 0 CT1 27 (SUTURE) ×4
SUT VIC AB 0 CT1 27XBRD ANBCTR (SUTURE) ×2 IMPLANT
SUT VIC AB 2-0 CT1 27 (SUTURE) ×4
SUT VIC AB 2-0 CT1 TAPERPNT 27 (SUTURE) ×2 IMPLANT
SUT VLOC 180 0 24IN GS25 (SUTURE) ×3 IMPLANT
SYR 50ML LL SCALE MARK (SYRINGE) ×3 IMPLANT
TOWEL OR 17X24 6PK STRL BLUE (TOWEL DISPOSABLE) ×3 IMPLANT
TOWEL OR 17X26 10 PK STRL BLUE (TOWEL DISPOSABLE) ×3 IMPLANT
TRAY FOLEY CATH 14FR (SET/KITS/TRAYS/PACK) IMPLANT
WATER STERILE IRR 1000ML POUR (IV SOLUTION) ×6 IMPLANT

## 2014-12-20 NOTE — Anesthesia Preprocedure Evaluation (Addendum)
Anesthesia Evaluation  Patient identified by MRN, date of birth, ID band Patient awake    Reviewed: Allergy & Precautions, NPO status , Patient's Chart, lab work & pertinent test results  History of Anesthesia Complications Negative for: history of anesthetic complications  Airway Mallampati: II  TM Distance: >3 FB Neck ROM: Full    Dental  (+) Teeth Intact   Pulmonary neg pulmonary ROS,  breath sounds clear to auscultation        Cardiovascular hypertension, Pt. on medications - angina- Past MI and - CHF + Valvular Problems/Murmurs AS Rhythm:Regular     Neuro/Psych PSYCHIATRIC DISORDERS Anxiety Depression S/p polio with right sided weakness  Neuromuscular disease    GI/Hepatic negative GI ROS, Neg liver ROS,   Endo/Other  Hypothyroidism   Renal/GU negative Renal ROS     Musculoskeletal  (+) Arthritis -,   Abdominal   Peds  Hematology negative hematology ROS (+)   Anesthesia Other Findings   Reproductive/Obstetrics                            Anesthesia Physical Anesthesia Plan  ASA: II  Anesthesia Plan: General and Regional   Post-op Pain Management:    Induction: Intravenous  Airway Management Planned: LMA  Additional Equipment: None  Intra-op Plan:   Post-operative Plan: Extubation in OR  Informed Consent: I have reviewed the patients History and Physical, chart, labs and discussed the procedure including the risks, benefits and alternatives for the proposed anesthesia with the patient or authorized representative who has indicated his/her understanding and acceptance.   Dental advisory given  Plan Discussed with: CRNA and Surgeon  Anesthesia Plan Comments:         Anesthesia Quick Evaluation

## 2014-12-20 NOTE — Progress Notes (Signed)
Utilization review completed.  

## 2014-12-20 NOTE — Anesthesia Postprocedure Evaluation (Signed)
  Anesthesia Post-op Note  Patient: Crystal Levy  Procedure(s) Performed: Procedure(s): LEFT TOTAL KNEE ARTHROPLASTY (Left)  Patient Location: PACU  Anesthesia Type:General and Regional  Level of Consciousness: awake and alert   Airway and Oxygen Therapy: Patient Spontanous Breathing  Post-op Pain: mild  Post-op Assessment: Post-op Vital signs reviewed, Patient's Cardiovascular Status Stable, Respiratory Function Stable, Patent Airway, No signs of Nausea or vomiting and Pain level controlled  Post-op Vital Signs: Reviewed and stable  Last Vitals:  Filed Vitals:   12/20/14 1331  BP: 127/62  Pulse: 74  Temp:   Resp: 13    Complications: No apparent anesthesia complications

## 2014-12-20 NOTE — Anesthesia Procedure Notes (Signed)
Anesthesia Regional Block:  Adductor canal block  Pre-Anesthetic Checklist: ,, timeout performed, Correct Patient, Correct Site, Correct Laterality, Correct Procedure, Correct Position, site marked, Risks and benefits discussed,  Surgical consent,  Pre-op evaluation,  At surgeon's request and post-op pain management  Laterality: Lower and Left  Prep: chloraprep       Needles:  Injection technique: Single-shot  Needle Type: Echogenic Stimulator Needle          Additional Needles:  Procedures: ultrasound guided (picture in chart) Adductor canal block Narrative:  Injection made incrementally with aspirations every 5 mL.  Performed by: Personally  Anesthesiologist: Lenea Bywater, CHRIS  Additional Notes: H+P and labs reviewed, risks and benefits discussed with patient, procedure tolerated well without complications

## 2014-12-20 NOTE — Interval H&P Note (Signed)
OK for surgery PD 

## 2014-12-20 NOTE — Plan of Care (Signed)
Problem: Consults Goal: Diagnosis- Total Joint Replacement Primary Total Knee Left     

## 2014-12-20 NOTE — Op Note (Signed)
PREOP DIAGNOSIS: DJD LEFT KNEE POSTOP DIAGNOSIS:  same PROCEDURE: LEFT TKR ANESTHESIA: General and block ATTENDING SURGEON: Makaya Juneau G ASSISTANTLoni Dolly PA  INDICATIONS FOR PROCEDURE: Crystal Levy is a 74 y.o. female who has struggled for a long time with pain due to degenerative arthritis of the left knee.  The patient has failed many conservative non-operative measures and at this point has pain which limits the ability to sleep and walk.  The patient is offered total knee replacement.  Informed operative consent was obtained after discussion of possible risks of anesthesia, infection, neurovascular injury, DVT, and death.  The importance of the post-operative rehabilitation protocol to optimize result was stressed extensively with the patient.  SUMMARY OF FINDINGS AND PROCEDURE:  Crystal Levy was taken to the operative suite where under the above anesthesia a left knee replacement was performed.  There were advanced degenerative changes and the bone quality was excellent.  We used the DePuyLCS system and placed size standard femur, 4 tibia, 35 mm all polyethylene patella, and a size 10 mm spacer.  Loni Dolly PA-C assisted throughout and was invaluable to the completion of the case in that he helped retract and maintain exposure while I placed the components.  He also helped close thereby minimizing OR time.  The patient was admitted for appropriate post-op care to include perioperative antibiotics and mechanical and pharmacologic measures for DVT prophylaxis.  DESCRIPTION OF PROCEDURE:  Crystal Levy was taken to the operative suite where the above anesthesia was applied.  The patient was positioned supine and prepped and draped in normal sterile fashion.  An appropriate time out was performed.  After the administration of kefzol pre-op antibiotic the leg was elevated and exsanguinated and a tourniquet inflated.  A standard longitudinal incision was made on the anterior knee.   Dissection was carried down to the extensor mechanism.  All appropriate anti-infective measures were used including the pre-operative antibiotic, betadine impregnated drape, and closed hooded exhaust systems for each member of the surgical team.  A medial parapatellar incision was made in the extensor mechanism and the knee cap flipped and the knee flexed.  Some residual meniscal tissues were removed along with any remaining ACL/PCL tissue.  A guide was placed on the tibia and a flat cut was made on it's superior surface.  An intramedullary guide was placed in the femur and was utilized to make anterior and posterior cuts creating an appropriate flexion gap.  A second intramedullary guide was placed in the femur to make a distal cut properly balancing the knee with an extension gap equal to the flexion gap.  The three bones sized to the above mentioned sizes and the appropriate guides were placed and utilized.  A trial reduction was done and the knee easily came to full extension and the patella tracked well on flexion.  The trial components were removed and all bones were cleaned with pulsatile lavage and then dried thoroughly.  Cement was mixed and was pressurized onto the bones followed by placement of the aforementioned components.  Excess cement was trimmed and pressure was held on the components until the cement had hardened.  The tourniquet was deflated and a small amount of bleeding was controlled with cautery and pressure.  The knee was irrigated thoroughly.  The extensor mechanism was re-approximated with V-loc suture in running fashion.  The knee was flexed and the repair was solid.  The subcutaneous tissues were re-approximated with #0 and #2-0 vicryl and the skin closed  with a subcuticular stitch and steristrips.  A sterile dressing was applied.  Intraoperative fluids, EBL, and tourniquet time can be obtained from anesthesia records.  DISPOSITION:  The patient was taken to recovery room in stable  condition and admitted for appropriate post-op care to include peri-operative antibiotic and DVT prophylaxis with mechanical and pharmacologic measures.  Petros Ahart G 12/20/2014, 9:13 AM

## 2014-12-20 NOTE — Progress Notes (Signed)
Orthopedic Tech Progress Note Patient Details:  Crystal Levy December 26, 1940 [id number]  CPM Left Knee CPM Left Knee: On Left Knee Flexion (Degrees): 90 Left Knee Extension (Degrees): 0 Additional Comments: trapeze bar patient helper Viewed from doctor's order list  Hildred Priest 12/20/2014, 11:00 AM

## 2014-12-20 NOTE — Transfer of Care (Signed)
Immediate Anesthesia Transfer of Care Note  Patient: Crystal Levy  Procedure(s) Performed: Procedure(s): LEFT TOTAL KNEE ARTHROPLASTY (Left)  Patient Location: PACU  Anesthesia Type:General and Regional  Level of Consciousness: patient cooperative and responds to stimulation  Airway & Oxygen Therapy: Patient Spontanous Breathing and Patient connected to nasal cannula oxygen  Post-op Assessment: Report given to RN and Post -op Vital signs reviewed and stable  Post vital signs: Reviewed and stable  Last Vitals:  Filed Vitals:   12/20/14 0945  BP:   Pulse:   Temp: 36.7 C  Resp:     Complications: No apparent anesthesia complications

## 2014-12-21 ENCOUNTER — Encounter (HOSPITAL_COMMUNITY): Payer: Self-pay | Admitting: Orthopaedic Surgery

## 2014-12-21 NOTE — Progress Notes (Signed)
Orthopedic Tech Progress Note Patient Details:  Crystal Levy 1940/09/13 [id number] On cpm at 8:00pm Patient ID: [name], female   DOB: [birth year], 74 y.o.   MRN: [id number]   Braulio Bosch 12/21/2014, 8:05 PM

## 2014-12-21 NOTE — Clinical Social Work Psychosocial (Signed)
Clinical Social Work Department BRIEF PSYCHOSOCIAL ASSESSMENT 12/21/2014  Crystal Levy:  Crystal Levy, Crystal Levy     Account Number:  [id number]     [place] date:  12/20/2014  Clinical Social Worker:  Wylene Men  Date/Time:  12/21/2014 12:04 PM  Referred by:  Physician  Date Referred:  12/21/2014 Referred for  SNF Placement  Psychosocial assessment   Other Referral:   none   Interview type:  Crystal Levy Other interview type:   none    PSYCHOSOCIAL DATA Living Status:  ALONE Admitted from facility:   Level of care:   Primary support name:  [name] Primary support relationship to Crystal Levy:  CHILD, ADULT Degree of support available:   adequate    CURRENT CONCERNS Current Concerns  Post-Acute Placement   Other Concerns:   none    SOCIAL WORK ASSESSMENT / PLAN CSW assesed Crystal Levy at bedside.  Crystal Levy was alert and oriented x4 during the course of this assessment.  Crystal Levy reports being from home alone and presented to [facility] status post scheduled total knee replacement.  Crystal Levy is a part of the bundle program and has set up SNF/STR at [facility] with her RNCM at the [facility] office.  Crystal Levy is agreeable to STR at time of discharge, but is looking forward to returning home to an independent living level of living after completion of STR.   Assessment/plan status:  Psychosocial Support/Ongoing Assessment of Needs Other assessment/ plan:   FL2  PASARR   Information/referral to community resources:   SNF    Crystal Levy'S/FAMILY'S RESPONSE TO PLAN OF CARE: Crystal Levy is a part of the bundle program and has been set up with [employer].  Projected dc tomorrow (Thursday 12/22/2014)       Nonnie Done, [place] [phone]  Psychiatric & Orthopedics (5N 1-8) Clinical Social Worker

## 2014-12-21 NOTE — Clinical Social Work Placement (Signed)
Clinical Social Work Department CLINICAL SOCIAL WORK PLACEMENT NOTE 12/21/2014  Patient:  GENEVIVE, PRINTUP  Account Number:  [id number] [place] date:  12/20/2014  Clinical Social Worker:  Wylene Men  Date/time:  12/21/2014 12:08 PM  Clinical Social Work is seeking post-discharge placement for this patient at the following level of care:   SKILLED NURSING   (*CSW will update this form in Epic as items are completed)   12/21/2014  Patient/family provided with [place] Department of Clinical Social Work's list of facilities offering this level of care within the geographic area requested by the patient (or if unable, by the patient's family).  12/21/2014  Patient/family informed of their freedom to choose among providers that offer the needed level of care, that participate in Medicare, Medicaid or managed care program needed by the patient, have an available bed and are willing to accept the patient.  12/21/2014  Patient/family informed of [facility]' ownership interest in [facility], as well as of the fact that they are under no obligation to receive care at this facility.  PASARR submitted to EDS on 12/21/2014 PASARR number received on 12/21/2014  FL2 transmitted to all facilities in geographic area requested by pt/family on  12/21/2014 FL2 transmitted to all facilities within larger geographic area on   Patient informed that his/her managed care company has contracts with or will negotiate with  certain facilities, including the following:     Patient/family informed of bed offers received:  12/21/2014 Patient chooses bed at [place] Physician recommends and patient chooses bed at  [facility]  Patient to be transferred to [place] on  12/22/2014 Patient to be transferred to facility by  Patient and family notified of transfer on 12/22/2014 Name of family member notified:  patient is alert and oriented and will update fam as needed  The following  physician request were entered in Epic:   Additional Comments:  Nonnie Done, [place] [phone]  Psychiatric & Orthopedics (5N 1-8) Clinical Social Worker

## 2014-12-21 NOTE — Progress Notes (Signed)
Subjective: 1 Day Post-Op Procedure(s) (LRB): LEFT TOTAL KNEE ARTHROPLASTY (Left)  Activity level:  wbat Diet tolerance:  eatign well Voiding:  ok Patient reports pain as mild.    Objective: Vital signs in last 24 hours: Temp:  [97.7 F (36.5 C)-99.3 F (37.4 C)] 99 F (37.2 C) (04/13 0333) Pulse Rate:  [70-103] 89 (04/13 0333) Resp:  [9-22] 17 (04/13 0400) BP: (114-181)/(51-91) 137/59 mmHg (04/13 0333) SpO2:  [91 %-100 %] 100 % (04/13 0400)  Labs: No results for input(s): HGB in the last 72 hours. No results for input(s): WBC, RBC, HCT, PLT in the last 72 hours. No results for input(s): NA, K, CL, CO2, BUN, CREATININE, GLUCOSE, CALCIUM in the last 72 hours. No results for input(s): LABPT, INR in the last 72 hours.  Physical Exam:  Neurologically intact ABD soft Neurovascular intact Sensation intact distally Intact pulses distally Dorsiflexion/Plantar flexion intact Incision: dressing C/D/I and no drainage No cellulitis present Compartment soft  Assessment/Plan:  1 Day Post-Op Procedure(s) (LRB): LEFT TOTAL KNEE ARTHROPLASTY (Left) Advance diet Up with therapy D/C IV fluids Plan for discharge tomorrow Discharge to [facility] place if doing well and cleared by PT. Continue on ASA 325mg  BID x 2 weeks post op. Follow up in office 2 weeks post op. I will change dressing to Aquacel prior to discharge. Continue current pain meds.    Westlee Devita, Larwance Sachs 12/21/2014, 7:46 AM

## 2014-12-21 NOTE — Evaluation (Addendum)
Physical Therapy Evaluation Patient Details Name: Crystal Levy MRN: [id number] DOB: 09/28/1940 Today's Date: 12/21/2014   History of Present Illness  Pt underwent L TKA 12-20-14. 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  Clinical Impression  Pt is s/p TKA resulting in the deficits listed below (see PT Problem List). Pt required min assist on eval for bed mobility, transfers and gait with RW. Gait distance limited to 15 feet due to pain and fatigue. Pt very motivated to participate in PT. Pt will benefit from skilled PT to increase their independence and safety with mobility to allow discharge to the venue listed below. Pt is planning for ST SNF at [facility] upon d/c. This is an appropriate plan as she lives alone.     Follow Up Recommendations SNF;Supervision/Assistance - 24 hour ([place])    Equipment Recommendations  None recommended by PT    Recommendations for Other Services       Precautions / Restrictions Precautions Precautions: Knee Required Braces or Orthoses: Knee Immobilizer - Left Knee Immobilizer - Left: On when out of bed or walking Restrictions Weight Bearing Restrictions: Yes LLE Weight Bearing: Weight bearing as tolerated      Mobility  Bed Mobility Overal bed mobility: Needs Assistance Bed Mobility: Supine to Sit;Sit to Supine     Supine to sit: Min assist Sit to supine: Min assist   General bed mobility comments: assist with LLE  Transfers Overall transfer level: Needs assistance Equipment used: Rolling walker (2 wheeled) Transfers: Sit to/from Stand Sit to Stand: Min assist         General transfer comment: assist to power up, verbal cues for hand placement  Ambulation/Gait Ambulation/Gait assistance: Min assist Ambulation Distance (Feet): 15 Feet Assistive device: Rolling walker (2 wheeled) Gait Pattern/deviations: Step-to pattern;Antalgic Gait velocity: decreased   General Gait Details: AFO RLE  Stairs             Wheelchair Mobility    Modified Rankin (Stroke Patients Only)       Balance                                             Pertinent Vitals/Pain Pain Assessment: 0-10 Pain Score: 7  Pain Location: left knee Pain Intervention(s): Monitored during session;Repositioned    Home Living Family/patient expects to be discharged to:: Private residence Living Arrangements: Alone Available Help at Discharge: Family;Available PRN/intermittently Type of Home: House Home Access: Level entry     Home Layout: One level Home Equipment: Walker - 2 wheels;Cane - quad;Cane - single point;Shower seat - built in      Prior Function Level of Independence: Independent               Journalist, newspaper        Extremity/Trunk Assessment   Upper Extremity Assessment: Overall WFL for tasks assessed           Lower Extremity Assessment: LLE deficits/detail;RLE deficits/detail RLE Deficits / Details: h/o polio, 0/5 DF, wears AFO    Cervical / Trunk Assessment: Normal  Communication   Communication: No difficulties  Cognition Arousal/Alertness: Awake/alert Behavior During Therapy: WFL for tasks assessed/performed Overall Cognitive Status: Within Functional Limits for tasks assessed                      General Comments      Exercises Total  Joint Exercises Goniometric ROM: 0-45 degrees left knee AAROM in supine  Ankle pumps AROM left x 10 Quad sets AROM left x 10      Assessment/Plan    PT Assessment Patient needs continued PT services  PT Diagnosis Acute pain;Difficulty walking   PT Problem List Decreased strength;Decreased range of motion;Decreased activity tolerance;Decreased balance;Decreased mobility;Decreased knowledge of precautions;Pain;Decreased knowledge of use of DME  PT Treatment Interventions DME instruction;Gait training;Functional mobility training;Therapeutic activities;Therapeutic exercise;Patient/family education;Balance  training   PT Goals (Current goals can be found in the Care Plan section) Acute Rehab PT Goals Patient Stated Goal: independence PT Goal Formulation: With patient Time For Goal Achievement: 12/28/14 Potential to Achieve Goals: Good    Frequency 7X/week   Barriers to discharge        Co-evaluation               End of Session Equipment Utilized During Treatment: Gait belt;Left knee immobilizer Activity Tolerance: Patient tolerated treatment well Patient left: in chair;with call bell/phone within reach           Time: 1039-1059 PT Time Calculation (min) (ACUTE ONLY): 20 min   Charges:   PT Evaluation $Initial PT Evaluation Tier I: 1 Procedure     PT G Codes:        Lorriane Shire 12/21/2014, 12:00 PM

## 2014-12-21 NOTE — Evaluation (Signed)
Occupational Therapy Evaluation Patient Details Name: Crystal Levy MRN: [id number] DOB: Jul 07, 1941 Today's Date: 12/21/2014    History of Present Illness Pt underwent L TKA 12-20-14. 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   Clinical Impression   Prior to admission, pt was independent in self care.  Presents with increased L knee pain and decreased balance interfering with ability to perform ADL and ADL transfers.  Plan is for post acute rehab prior to return home as pt lives alone. Will defer further OT to SNF.   Follow Up Recommendations  SNF    Equipment Recommendations       Recommendations for Other Services       Precautions / Restrictions Precautions Precautions: Knee;Fall Required Braces or Orthoses: Knee Immobilizer - Left Knee Immobilizer - Left: On when out of bed or walking Restrictions Weight Bearing Restrictions: Yes LLE Weight Bearing: Weight bearing as tolerated      Mobility Bed Mobility Overal bed mobility: Needs Assistance Bed Mobility: Supine to Sit;Sit to Supine     Supine to sit: Min assist Sit to supine: Min assist   General bed mobility comments: assist with LLE  Transfers Overall transfer level: Needs assistance Equipment used: Rolling walker (2 wheeled) Transfers: Sit to/from Stand Sit to Stand: Min assist         General transfer comment: assist to power up, verbal cues for hand placement    Balance                                            ADL Overall ADL's : Needs assistance/impaired Eating/Feeding: Independent;Sitting   Grooming: Wash/dry hands;Set up;Sitting   Upper Body Bathing: Set up;Sitting   Lower Body Bathing: Minimal assistance;Sit to/from stand   Upper Body Dressing : Set up;Sitting   Lower Body Dressing: Minimal assistance;Sit to/from stand   Toilet Transfer: Minimal assistance;RW;Stand-pivot;BSC   Toileting- Clothing Manipulation and Hygiene: Minimal assistance;Sit to/from  stand       Functional mobility during ADLs: Rolling walker;Minimal assistance (stand pivot only)       Vision     Perception     Praxis      Pertinent Vitals/Pain Pain Assessment: 0-10 Pain Score: 8  Pain Location: R knee Pain Descriptors / Indicators: Aching Pain Intervention(s): Monitored during session;Repositioned;Premedicated before session;Limited activity within patient's tolerance     Hand Dominance Right   Extremity/Trunk Assessment Upper Extremity Assessment Upper Extremity Assessment: Overall WFL for tasks assessed   Lower Extremity Assessment Lower Extremity Assessment: Defer to PT evaluation RLE Deficits / Details: h/o polio, 0/5 DF, wears AFO LLE: Unable to fully assess due to pain   Cervical / Trunk Assessment Cervical / Trunk Assessment: Normal   Communication Communication Communication: No difficulties   Cognition Arousal/Alertness: Awake/alert Behavior During Therapy: WFL for tasks assessed/performed Overall Cognitive Status: Within Functional Limits for tasks assessed                     General Comments       Exercises       Shoulder Instructions      Home Living Family/patient expects to be discharged to:: Skilled nursing facility Living Arrangements: Alone Available Help at Discharge: Family;Available PRN/intermittently Type of Home: House Home Access: Level entry     Home Layout: One level  Home Equipment: Bells - 2 wheels;Cane - quad;Cane - single point;Shower seat - built in          Prior Functioning/Environment Level of Independence: Independent        Comments: used AFO on R LE prior to admission    OT Diagnosis: Generalized weakness;Acute pain   OT Problem List:     OT Treatment/Interventions:      OT Goals(Current goals can be found in the care plan section) Acute Rehab OT Goals Patient Stated Goal: independence  OT Frequency:     Barriers to D/C:             Co-evaluation              End of Session Equipment Utilized During Treatment: Rolling walker;Gait belt CPM Left Knee CPM Left Knee: On  Activity Tolerance: No increased pain Patient left: in bed;with call bell/phone within reach;in CPM   Time: 1452-1510 OT Time Calculation (min): 18 min Charges:  OT General Charges $OT Visit: 1 Procedure OT Evaluation $Initial OT Evaluation Tier I: 1 Procedure G-Codes:    Malka So 12/21/2014, 3:34 PM  [phone]

## 2014-12-21 NOTE — Care Management Note (Signed)
CARE MANAGEMENT NOTE 12/21/2014  Patient:  Crystal Levy, Crystal Levy   Account Number:  [id number]  Date Initiated:  12/21/2014  Documentation initiated by:  Ricki Miller  Subjective/Objective Assessment:   74 yr old female admitted with Left knee DJD. Patient underwent a left total knee arthroplasty.     Action/Plan:   patient is for shortterm rehab SNF. Will go to [facility]. Social worker is aware.   Anticipated DC Date:  12/23/2014   Anticipated DC Plan:  SKILLED NURSING FACILITY  In-house referral  Clinical Social Worker      DC Planning Services  CM consult      [facility] Choice  NA   Choice offered to / List presented to:     DME arranged  NA        [place] arranged  NA      Status of service:  Completed, signed off Medicare Important Message given?   (If response is "NO", the following Medicare IM given date fields will be blank) Date Medicare IM given:   Medicare IM given by:   Date Additional Medicare IM given:   Additional Medicare IM given by:    Discharge Disposition:  [place]  Per UR Regulation:  Reviewed for med. necessity/level of care/duration of stay

## 2014-12-22 ENCOUNTER — Other Ambulatory Visit: Payer: Self-pay | Admitting: *Deleted

## 2014-12-22 DIAGNOSIS — M1712 Unilateral primary osteoarthritis, left knee: Secondary | ICD-10-CM | POA: Diagnosis not present

## 2014-12-22 DIAGNOSIS — J029 Acute pharyngitis, unspecified: Secondary | ICD-10-CM | POA: Diagnosis not present

## 2014-12-22 DIAGNOSIS — F329 Major depressive disorder, single episode, unspecified: Secondary | ICD-10-CM | POA: Diagnosis not present

## 2014-12-22 DIAGNOSIS — K5901 Slow transit constipation: Secondary | ICD-10-CM | POA: Diagnosis not present

## 2014-12-22 DIAGNOSIS — F419 Anxiety disorder, unspecified: Secondary | ICD-10-CM | POA: Diagnosis not present

## 2014-12-22 DIAGNOSIS — E039 Hypothyroidism, unspecified: Secondary | ICD-10-CM | POA: Diagnosis not present

## 2014-12-22 DIAGNOSIS — Z471 Aftercare following joint replacement surgery: Secondary | ICD-10-CM | POA: Diagnosis not present

## 2014-12-22 DIAGNOSIS — I10 Essential (primary) hypertension: Secondary | ICD-10-CM | POA: Diagnosis not present

## 2014-12-22 DIAGNOSIS — R2681 Unsteadiness on feet: Secondary | ICD-10-CM | POA: Diagnosis not present

## 2014-12-22 DIAGNOSIS — M1990 Unspecified osteoarthritis, unspecified site: Secondary | ICD-10-CM | POA: Diagnosis not present

## 2014-12-22 DIAGNOSIS — I350 Nonrheumatic aortic (valve) stenosis: Secondary | ICD-10-CM | POA: Diagnosis not present

## 2014-12-22 DIAGNOSIS — Z96652 Presence of left artificial knee joint: Secondary | ICD-10-CM | POA: Diagnosis not present

## 2014-12-22 DIAGNOSIS — M6281 Muscle weakness (generalized): Secondary | ICD-10-CM | POA: Diagnosis not present

## 2014-12-22 DIAGNOSIS — R261 Paralytic gait: Secondary | ICD-10-CM | POA: Diagnosis not present

## 2014-12-22 MED ORDER — ASPIRIN 325 MG PO TBEC: 325.0000 mg | DELAYED_RELEASE_TABLET | Freq: Two times a day (BID) | ORAL | Status: DC

## 2014-12-22 MED ORDER — OXYCODONE-ACETAMINOPHEN 5-325 MG PO TABS: 1.0000 | ORAL_TABLET | ORAL | Status: DC | PRN

## 2014-12-22 MED ORDER — METHOCARBAMOL 500 MG PO TABS: 500.0000 mg | ORAL_TABLET | Freq: Four times a day (QID) | ORAL | Status: DC | PRN

## 2014-12-22 NOTE — Discharge Summary (Signed)
Patient ID: Crystal Levy MRN: [id number] DOB/AGE: 1941/08/28 74 y.o.  Admit date: 12/20/2014 Discharge date: 12/22/2014  Admission Diagnoses:  Principal Problem:   Primary osteoarthritis of left knee Active Problems:   Primary osteoarthritis of knee   Discharge Diagnoses:  Same  Past Medical History  Diagnosis Date  . Polio   . Hypertension   . Bicuspid aortic valve   . Thyroid disease   . Hypothyroidism   . Anxiety   . Depression   . Arthritis     Surgeries: Procedure(s): LEFT TOTAL KNEE ARTHROPLASTY on 12/20/2014   Consultants:    Discharged Condition: Improved  Hospital Course: Crystal Levy is an 74 y.o. female who was admitted 12/20/2014 for operative treatment ofPrimary osteoarthritis of left knee. Patient has severe unremitting pain that affects sleep, daily activities, and work/hobbies. After pre-op clearance the patient was taken to the operating room on 12/20/2014 and underwent  Procedure(s): LEFT TOTAL KNEE ARTHROPLASTY.    Patient was given perioperative antibiotics: Anti-infectives    Start     Dose/Rate Route Frequency Ordered Stop   12/20/14 1615  ceFAZolin (ANCEF) IVPB 2 g/50 mL premix     2 g 100 mL/hr over 30 Minutes Intravenous Every 6 hours 12/20/14 1603 12/21/14 0029       Patient was given sequential compression devices, early ambulation, and chemoprophylaxis to prevent DVT.  Patient benefited maximally from hospital stay and there were no complications.    Recent vital signs: Patient Vitals for the past 24 hrs:  BP Temp Temp src Pulse Resp SpO2  12/22/14 0600 (!) 145/57 mmHg 98.7 F (37.1 C) - 94 18 95 %  12/22/14 0400 - - - - 18 95 %  12/22/14 0000 - - - - 17 93 %  12/21/14 2036 (!) 151/64 mmHg 98.2 F (36.8 C) Oral 78 17 93 %  12/21/14 2000 - - - - 17 93 %  12/21/14 1600 - - - - 18 -  12/21/14 1500 135/62 mmHg (!) 101.2 F (38.4 C) - 88 16 98 %  12/21/14 1200 - - - - 18 -     Recent laboratory studies: No results for  input(s): WBC, HGB, HCT, PLT, NA, K, CL, CO2, BUN, CREATININE, GLUCOSE, INR, CALCIUM in the last 72 hours.  Invalid input(s): PT, 2   Discharge Medications:     Medication List    TAKE these medications        aspirin 325 MG EC tablet  Take 1 tablet (325 mg total) by mouth 2 (two) times daily after a meal.     benazepril 10 MG tablet  Commonly known as:  LOTENSIN  Take 10 mg by mouth daily.     citalopram 20 MG tablet  Commonly known as:  CELEXA  Take 20 mg by mouth daily.     docusate sodium 100 MG capsule  Commonly known as:  COLACE  Take 100 mg by mouth 2 (two) times daily as needed. For constipation.     hydrochlorothiazide 12.5 MG capsule  Commonly known as:  MICROZIDE  Take 1 capsule (12.5 mg total) by mouth daily.     levothyroxine 112 MCG tablet  Commonly known as:  SYNTHROID, LEVOTHROID  Take 112 mcg by mouth daily.     LORazepam 0.5 MG tablet  Commonly known as:  ATIVAN  Take 0.5 mg by mouth at bedtime as needed for sleep. For sleep/anxiety.     methocarbamol 500 MG tablet  Commonly known as:  ROBAXIN  Take 1 tablet (500 mg total) by mouth every 6 (six) hours as needed for muscle spasms.     niacin 500 MG tablet  Take 250 mg by mouth at bedtime.     oxyCODONE-acetaminophen 5-325 MG per tablet  Commonly known as:  PERCOCET/ROXICET  Take 1-2 tablets by mouth every 4 (four) hours as needed for moderate pain or severe pain.        Diagnostic Studies: Dg Chest 2 View  12/09/2014   CLINICAL DATA:  Preoperative evaluation for total knee arthroplasty. Hypertension  EXAM: CHEST  2 VIEW  COMPARISON:  November 02, 2014  FINDINGS: There is no edema or consolidation. Heart size and pulmonary vascularity are normal. No adenopathy. There is degenerative change in the thoracic spine.  IMPRESSION: No edema or consolidation.   Electronically Signed   By: Lowella Grip III M.D.   On: 12/09/2014 09:41    Disposition: 01-Home or Self Care      Discharge  Instructions    Call MD / Call 911    Complete by:  As directed   If you experience chest pain or shortness of breath, CALL 911 and be transported to the hospital emergency room.  If you develope a fever above 101 F, pus (white drainage) or increased drainage or redness at the wound, or calf pain, call your surgeon's office.     Constipation Prevention    Complete by:  As directed   Drink plenty of fluids.  Prune juice may be helpful.  You may use a stool softener, such as Colace (over the counter) 100 mg twice a day.  Use MiraLax (over the counter) for constipation as needed.     Diet - low sodium heart healthy    Complete by:  As directed      Discharge instructions    Complete by:  As directed   INSTRUCTIONS AFTER JOINT REPLACEMENT   Remove items at home which could result in a fall. This includes throw rugs or furniture in walking pathways ICE to the affected joint every three hours while awake for 30 minutes at a time, for at least the first 3-5 days, and then as needed for pain and swelling.  Continue to use ice for pain and swelling. You may notice swelling that will progress down to the foot and ankle.  This is normal after surgery.  Elevate your leg when you are not up walking on it.   Continue to use the breathing machine you got in the hospital (incentive spirometer) which will help keep your temperature down.  It is common for your temperature to cycle up and down following surgery, especially at night when you are not up moving around and exerting yourself.  The breathing machine keeps your lungs expanded and your temperature down.   DIET:  As you were doing prior to hospitalization, we recommend a well-balanced diet.  DRESSING / WOUND CARE / SHOWERING  Keep dressing clean and dry until follow up.  ACTIVITY  Increase activity slowly as tolerated, but follow the weight bearing instructions below.   No driving for 6 weeks or until further direction given by your physician.  You  cannot drive while taking narcotics.  No lifting or carrying greater than 10 lbs. until further directed by your surgeon. Avoid periods of inactivity such as sitting longer than an hour when not asleep. This helps prevent blood clots.  You may return to work once you are authorized by your doctor.  WEIGHT BEARING   Weight bearing as tolerated with assist device (walker, cane, etc) as directed, use it as long as suggested by your surgeon or therapist, typically at least 4-6 weeks.   EXERCISES  Results after joint replacement surgery are often greatly improved when you follow the exercise, range of motion and muscle strengthening exercises prescribed by your doctor. Safety measures are also important to protect the joint from further injury. Any time any of these exercises cause you to have increased pain or swelling, decrease what you are doing until you are comfortable again and then slowly increase them. If you have problems or questions, call your caregiver or physical therapist for advice.   Rehabilitation is important following a joint replacement. After just a few days of immobilization, the muscles of the leg can become weakened and shrink (atrophy).  These exercises are designed to build up the tone and strength of the thigh and leg muscles and to improve motion. Often times heat used for twenty to thirty minutes before working out will loosen up your tissues and help with improving the range of motion but do not use heat for the first two weeks following surgery (sometimes heat can increase post-operative swelling).   These exercises can be done on a training (exercise) mat, on the floor, on a table or on a bed. Use whatever works the best and is most comfortable for you.    Use music or television while you are exercising so that the exercises are a pleasant break in your day. This will make your life better with the exercises acting as a break in your routine that you can look forward  to.   Perform all exercises about fifteen times, three times per day or as directed.  You should exercise both the operative leg and the other leg as well.   Exercises include:   Quad Sets - Tighten up the muscle on the front of the thigh (Quad) and hold for 5-10 seconds.   Straight Leg Raises - With your knee straight (if you were given a brace, keep it on), lift the leg to 60 degrees, hold for 3 seconds, and slowly lower the leg.  Perform this exercise against resistance later as your leg gets stronger.  Leg Slides: Lying on your back, slowly slide your foot toward your buttocks, bending your knee up off the floor (only go as far as is comfortable). Then slowly slide your foot back down until your leg is flat on the floor again.  Angel Wings: Lying on your back spread your legs to the side as far apart as you can without causing discomfort.  Hamstring Strength:  Lying on your back, push your heel against the floor with your leg straight by tightening up the muscles of your buttocks.  Repeat, but this time bend your knee to a comfortable angle, and push your heel against the floor.  You may put a pillow under the heel to make it more comfortable if necessary.   A rehabilitation program following joint replacement surgery can speed recovery and prevent re-injury in the future due to weakened muscles. Contact your doctor or a physical therapist for more information on knee rehabilitation.    CONSTIPATION  Constipation is defined medically as fewer than three stools per week and severe constipation as less than one stool per week.  Even if you have a regular bowel pattern at home, your normal regimen is likely to be disrupted due to multiple reasons following surgery.  Combination of anesthesia, postoperative narcotics, change in appetite and fluid intake all can affect your bowels.   YOU MUST use at least one of the following options; they are listed in order of increasing strength to get the job  done.  They are all available over the counter, and you may need to use some, POSSIBLY even all of these options:    Drink plenty of fluids (prune juice may be helpful) and high fiber foods Colace 100 mg by mouth twice a day  Senokot for constipation as directed and as needed Dulcolax (bisacodyl), take with full glass of water  Miralax (polyethylene glycol) once or twice a day as needed.  If you have tried all these things and are unable to have a bowel movement in the first 3-4 days after surgery call either your surgeon or your primary doctor.    If you experience loose stools or diarrhea, hold the medications until you stool forms back up.  If your symptoms do not get better within 1 week or if they get worse, check with your doctor.  If you experience "the worst abdominal pain ever" or develop nausea or vomiting, please contact the office immediately for further recommendations for treatment.   ITCHING:  If you experience itching with your medications, try taking only a single pain pill, or even half a pain pill at a time.  You can also use Benadryl over the counter for itching or also to help with sleep.   TED HOSE STOCKINGS:  Use stockings on both legs until for at least 2 weeks or as directed by physician office. They may be removed at night for sleeping.  MEDICATIONS:  See your medication summary on the "After Visit Summary" that nursing will review with you.  You may have some home medications which will be placed on hold until you complete the course of blood thinner medication.  It is important for you to complete the blood thinner medication as prescribed.  PRECAUTIONS:  If you experience chest pain or shortness of breath - call 911 immediately for transfer to the hospital emergency department.   If you develop a fever greater that 101 F, purulent drainage from wound, increased redness or drainage from wound, foul odor from the wound/dressing, or calf pain - CONTACT YOUR SURGEON.                                                    FOLLOW-UP APPOINTMENTS:  If you do not already have a post-op appointment, please call the office for an appointment to be seen by your surgeon.  Guidelines for how soon to be seen are listed in your "After Visit Summary", but are typically between 1-4 weeks after surgery.  OTHER INSTRUCTIONS:   Knee Replacement:  Do not place pillow under knee, focus on keeping the knee straight while resting. CPM instructions: 0-90 degrees, 2 hours in the morning, 2 hours in the afternoon, and 2 hours in the evening. Place foam block, curve side up under heel at all times except when in CPM or when walking.  DO NOT modify, tear, cut, or change the foam block in any way.  MAKE SURE YOU:  Understand these instructions.  Get help right away if you are not doing well or get worse.    Thank you for letting us be a part  of your medical care team.  It is a privilege we respect greatly.  We hope these instructions will help you stay on track for a fast and full recovery!     Increase activity slowly as tolerated    Complete by:  As directed            Follow-up Information    Follow up with Hessie Dibble, MD.   Specialty:  Orthopedic Surgery   Contact information:   [place] [phone]        Signed: Rich Fuchs 12/22/2014, 11:40 AM

## 2014-12-22 NOTE — Telephone Encounter (Signed)
[employer] [employer]. Patient discharged to [facility] today.

## 2014-12-22 NOTE — Progress Notes (Signed)
Physical Therapy Treatment Patient Details Name: Crystal Levy MRN: [id number] DOB: 19-Mar-1941 Today's Date: 12/22/2014    History of Present Illness Pt underwent L TKA 12-20-14. 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    PT Comments    Pt preparing for d/c to SNF later today. Overall min A with RW for mobility once KI donned to LLE. Pt continues to benefit from skilled PT intervention to address impairments in ROM, strength, balance, functional mobility, and endurance. Improved gait distance noted today and reviewed HEP for LLE strengthening/ROM.   Follow Up Recommendations  SNF;Supervision/Assistance - 24 hour     Equipment Recommendations  None recommended by PT    Recommendations for Other Services       Precautions / Restrictions Precautions Precautions: Knee;Fall Required Braces or Orthoses: Knee Immobilizer - Left Knee Immobilizer - Left: On when out of bed or walking Restrictions Weight Bearing Restrictions: Yes LLE Weight Bearing: Weight bearing as tolerated    Mobility  Bed Mobility Overal bed mobility: Needs Assistance Bed Mobility: Supine to Sit;Sit to Supine     Supine to sit: Supervision Sit to supine: Supervision   General bed mobility comments: cues for technique but did not need physical A this session  Transfers Overall transfer level: Needs assistance Equipment used: Rolling walker (2 wheeled) Transfers: Sit to/from Stand Sit to Stand: Min assist         General transfer comment: verbal cues for hand placement and technique; min A to get up into standing  Ambulation/Gait Ambulation/Gait assistance: Min guard Ambulation Distance (Feet): 150 Feet Assistive device: Rolling walker (2 wheeled) Gait Pattern/deviations: Antalgic;Step-to pattern;Decreased stance time - left;Trunk flexed     General Gait Details: Pt chose not to wear RLE AFO this session. Cues for upright posture and to maintain body inside of RW during gait. Pt able to  self correct towards the end   Stairs            Wheelchair Mobility    Modified Rankin (Stroke Patients Only)       Balance                                    Cognition Arousal/Alertness: Awake/alert Behavior During Therapy: WFL for tasks assessed/performed Overall Cognitive Status: Within Functional Limits for tasks assessed                      Exercises Total Joint Exercises Ankle Circles/Pumps: AROM;Strengthening;Both;15 reps;Seated Quad Sets: Strengthening;Left;10 reps;Supine Heel Slides: AAROM;Strengthening;Left;10 reps;Supine Hip ABduction/ADduction: AAROM;Strengthening;Left;10 reps;Supine Straight Leg Raises: Strengthening;Left;10 reps;Supine Long Arc Quad: AAROM;Strengthening;Left;15 reps;Seated Knee Flexion: Strengthening;AAROM;Left;15 reps;Seated    General Comments General comments (skin integrity, edema, etc.): Education on edema control for LLE      Pertinent Vitals/Pain Pain Assessment: 0-10 Pain Score: 4  Pain Location: L knee Pain Descriptors / Indicators: Aching Pain Intervention(s): Monitored during session;Premedicated before session    Home Living                      Prior Function            PT Goals (current goals can now be found in the care plan section) Acute Rehab PT Goals Patient Stated Goal: get home soon PT Goal Formulation: With patient Time For Goal Achievement: 12/28/14 Potential to Achieve Goals: Good Progress towards PT goals: Progressing toward goals    Frequency  7X/week  PT Plan Current plan remains appropriate    Co-evaluation             End of Session Equipment Utilized During Treatment: Left knee immobilizer Activity Tolerance: Patient tolerated treatment well Patient left: in bed;with call bell/phone within reach     Time: 9574-7340 PT Time Calculation (min) (ACUTE ONLY): 28 min  Charges:  $Gait Training: 8-22 mins $Therapeutic Exercise: 8-22 mins                     G Codes:      Allayne Gitelman 12/22/2014, 2:41 PM

## 2014-12-22 NOTE — Clinical Social Work Note (Signed)
Patient has bed available and ready at [facility] today if medically appropriate for discharge.  Nonnie Done, [place] [phone]  Psychiatric & Orthopedics (5N 1-8) Clinical Social Worker

## 2014-12-22 NOTE — Progress Notes (Addendum)
Pt's son is here to gave pt a ride to [place]. Assisted pt dress up and 10 mg of Oxy gaven for her pain management while ambulating. Report gave to Selinda Eon, Therapist, sports at [facility].

## 2014-12-28 ENCOUNTER — Non-Acute Institutional Stay (SKILLED_NURSING_FACILITY): Payer: Medicare Other | Admitting: Internal Medicine

## 2014-12-28 DIAGNOSIS — E039 Hypothyroidism, unspecified: Secondary | ICD-10-CM | POA: Diagnosis not present

## 2014-12-28 DIAGNOSIS — M1712 Unilateral primary osteoarthritis, left knee: Secondary | ICD-10-CM

## 2014-12-28 DIAGNOSIS — F329 Major depressive disorder, single episode, unspecified: Secondary | ICD-10-CM

## 2014-12-28 DIAGNOSIS — K5901 Slow transit constipation: Secondary | ICD-10-CM | POA: Diagnosis not present

## 2014-12-28 DIAGNOSIS — J029 Acute pharyngitis, unspecified: Secondary | ICD-10-CM

## 2014-12-28 DIAGNOSIS — I10 Essential (primary) hypertension: Secondary | ICD-10-CM | POA: Diagnosis not present

## 2014-12-28 DIAGNOSIS — F32A Depression, unspecified: Secondary | ICD-10-CM

## 2014-12-28 NOTE — Progress Notes (Signed)
Patient ID: Crystal Levy, female   DOB: [birth year], 74 y.o.   MRN: [id number]     [facility]   PCP: [place], Malka So, MD  Code Status: full code  Allergies  Allergen Reactions  . Penicillins Rash  . Azithromycin   . Clindamycin/Lincomycin Diarrhea and Other (See Comments)    C-Diff  . Eggs Or Egg-Derived Products   . Milk-Related Compounds     Unknown reaction  . Sulfa Antibiotics Diarrhea  . Sulfamethoxazole-Trimethoprim   . Fenofibrate Rash    Chief Complaint  Patient presents with  . New Admit To SNF     HPI:  74 year old patient is here for short term rehabilitation post hospital admission from 12/20/14-12/22/14 with primary OA of left knee. She underwent left total knee arthroplasty. She is seen in her room today. She would like her pain regimen adjusted. She was not able to sleep last night due to pain. Muscle relaxant has been helpful. Her current bowel regimen has been helping her. She is also concerned about high bp reading in facility. Her hctz dosing was increased in facility and that has helped with swelling in her left leg. No other concerns.  She has PMH of HTN, OA, PVD among others. 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 Negative for fever, chills, diaphoresis.  HENT: Negative for headache, congestion, nasal discharge Eyes: Negative for eye pain, blurred vision, double vision and discharge.  Respiratory: Negative for cough, shortness of breath and wheezing.   Cardiovascular: Negative for chest pain, palpitations. Positive for left leg swelling.  Gastrointestinal: Negative for heartburn, nausea, vomiting, abdominal pain Genitourinary: Negative for dysuria Musculoskeletal: Negative for back pain, falls Skin: Negative for itching, rash.  Neurological: Negative for dizziness, tingling, focal weakness Psychiatric/Behavioral: Negative for depression   Past  Medical History  Diagnosis Date  . Polio   . Hypertension   . Bicuspid aortic valve   . Thyroid disease   . Hypothyroidism   . Anxiety   . Depression   . Arthritis    Past Surgical History  Procedure Laterality Date  . Hernia repair    . Joint replacement      left hip  . Tonsillectomy    . Breast surgery    . Radical vaginal hysterectomy  1993  . Knee surgery      meniscus  . Bunionectomy    . Hammer toe surgery    . Breast enhancement surgery    . Abdominal hysterectomy    . Eye surgery    . Cardiac catheterization    . Total knee arthroplasty Left 12/20/2014    Procedure: LEFT TOTAL KNEE ARTHROPLASTY;  Surgeon: Melrose Nakayama, MD;  Location: [facility];  Service: Orthopedics;  Laterality: Left;   Social History:   reports that she has never smoked. She has never used smokeless tobacco. She reports that she drinks alcohol. She reports that she does not use illicit drugs.  Family History  Problem Relation Age of Onset  . CAD Mother 14  . Aortic stenosis Mother     Medications: Patient's Medications  New Prescriptions   No medications on file  Previous Medications   ASPIRIN EC 325 MG EC TABLET    Take 1 tablet (325 mg total) by mouth 2 (two) times daily after a meal.   BENAZEPRIL (LOTENSIN) 10 MG TABLET    Take 10 mg by mouth daily.  CITALOPRAM (CELEXA) 20 MG TABLET    Take 20 mg by mouth daily.   DOCUSATE SODIUM (COLACE) 100 MG CAPSULE    Take 100 mg by mouth 2 (two) times daily as needed. For constipation.   HYDROCHLOROTHIAZIDE (MICROZIDE) 12.5 MG CAPSULE    Take 1 capsule (12.5 mg total) by mouth daily.   LEVOTHYROXINE (SYNTHROID, LEVOTHROID) 112 MCG TABLET    Take 112 mcg by mouth daily.   LORAZEPAM (ATIVAN) 0.5 MG TABLET    Take 0.5 mg by mouth at bedtime as needed for sleep. For sleep/anxiety.   METHOCARBAMOL (ROBAXIN) 500 MG TABLET    Take 1 tablet (500 mg total) by mouth every 6 (six) hours as needed for muscle spasms.   NIACIN 500 MG TABLET    Take 250 mg by  mouth at bedtime.   OXYCODONE-ACETAMINOPHEN (PERCOCET/ROXICET) 5-325 MG PER TABLET    Take 1-2 tablets by mouth every 4 (four) hours as needed for moderate pain or severe pain. Do not exceed 4gm of Tylenol in 24 hours  Modified Medications   No medications on file  Discontinued Medications   No medications on file     Physical Exam: Filed Vitals:   12/28/14 1220  BP: 145/65  Pulse: 78  Temp: 98.6 F (37 C)  Resp: 18  Weight: 149 lb 6.4 oz (67.767 kg)  SpO2: 99%    General- elderly female, in no acute distress Head- normocephalic, atraumatic Throat- moist mucus membrane Eyes- PERRLA, EOMI, no pallor, no icterus, no discharge Neck- no cervical lymphadenopathy Cardiovascular- normal s1,s2, no murmurs, palpable diminished dorsalis pedis. good radial pulses, 1+ left leg edema Respiratory- bilateral clear to auscultation, no wheeze, no rhonchi, no crackles, no use of accessory muscles Abdomen- bowel sounds present, soft, non tender Musculoskeletal- able to move all 4 extremities, right leg smaller than left, right leg has a brace, left leg in CPM machine, ted hose in left leg Neurological- no focal deficit Skin- warm and dry, aquacel dressing to left knee Psychiatry- alert and oriented to person, place and time, normal mood and affect    Labs reviewed: Basic Metabolic Panel:  Recent Labs  12/09/14 0929  NA 138  K 3.6  CL 102  CO2 27  GLUCOSE 78  BUN 10  CREATININE 0.56  CALCIUM 9.7   Liver Function Tests: No results for input(s): AST, ALT, ALKPHOS, BILITOT, PROT, ALBUMIN in the last 8760 hours. No results for input(s): LIPASE, AMYLASE in the last 8760 hours. No results for input(s): AMMONIA in the last 8760 hours. CBC:  Recent Labs  12/09/14 0929  WBC 6.4  NEUTROABS 4.3  HGB 13.9  HCT 41.9  MCV 88.2  PLT 298    Assessment/Plan  Left knee OA S/p left knee arthroplasty. On percocet 5-325 1 tab q6h with 1 tab q4h prn for breakthrough pain. Change her  percocet 5-325 night time dose to 2 tab and continue current regimen. F/u with orthopedics. Continue ted hose and ice pack prn. Continue aspirin 325 mg bid for dvt prophylaxis. Will have her work with physical therapy and occupational therapy team to help with gait training and muscle strengthening exercises.fall precautions. Skin care. Encourage to be out of bed.   Constipation Continue senna s 2 tab bid and miralax daily for now  HTN bp on higher side. On hctz 25 mg daily and benazepril 10 mg daily. hctz dose was increased 2 days back, continue current regimen, check bp q shift and adjust further in 1 week if remains  elevated. Monitor bmp periodically  Depression Continue celexa 20 mg daily  Hypothyroidism Continue levothyroxine 112 mcg daily  Sore throat Chloraseptic spray has been helpful, continue this  Goals of care: short term rehabilitation   Family/ staff Communication: reviewed care plan with patient and nursing supervisor    Blanchie Serve, MD  [place] [phone] (Monday-Friday 8 am - 5 pm) [phone] (afterhours)

## 2014-12-29 ENCOUNTER — Non-Acute Institutional Stay (SKILLED_NURSING_FACILITY): Payer: Medicare Other | Admitting: Adult Health

## 2014-12-29 ENCOUNTER — Encounter: Payer: Self-pay | Admitting: Adult Health

## 2014-12-29 DIAGNOSIS — I10 Essential (primary) hypertension: Secondary | ICD-10-CM

## 2014-12-29 DIAGNOSIS — K5901 Slow transit constipation: Secondary | ICD-10-CM | POA: Diagnosis not present

## 2014-12-29 DIAGNOSIS — F329 Major depressive disorder, single episode, unspecified: Secondary | ICD-10-CM | POA: Diagnosis not present

## 2014-12-29 DIAGNOSIS — E039 Hypothyroidism, unspecified: Secondary | ICD-10-CM | POA: Diagnosis not present

## 2014-12-29 DIAGNOSIS — M1712 Unilateral primary osteoarthritis, left knee: Secondary | ICD-10-CM | POA: Diagnosis not present

## 2014-12-29 DIAGNOSIS — F32A Depression, unspecified: Secondary | ICD-10-CM

## 2014-12-29 NOTE — Progress Notes (Signed)
Patient ID: Crystal Levy, female   DOB: 01-12-1941, 74 y.o.   MRN: [id number]   12/29/2014  Facility:  [facility] Location:  [place] Room Number: 937-J LEVEL OF CARE:  SNF (31)   Chief Complaint  Patient presents with  . Discharge Note    Osteoarthritis S/P left total knee arthroplasty, hypertension, hypothyroidism, depression, constipation and anxiety    HISTORY OF PRESENT ILLNESS:  This is a 74 year old female who is for discharge home with home health PT. She has been admitted to [facility] on 12/22/14 from [facility] with osteoarthritis S/P left total knee arthroplasty. She has PMH of hypertension, osteoarthritis and PVD.  Patient was admitted to this facility for short-term rehabilitation after the patient's recent hospitalization.  Patient has completed SNF rehabilitation and therapy has cleared the patient for discharge.  PAST MEDICAL HISTORY:  Past Medical History  Diagnosis Date  . Polio   . Hypertension   . Bicuspid aortic valve   . Thyroid disease   . Hypothyroidism   . Anxiety   . Depression   . Arthritis     CURRENT MEDICATIONS: Reviewed per MAR/see medication list  Allergies  Allergen Reactions  . Penicillins Rash  . Azithromycin   . Clindamycin/Lincomycin Diarrhea and Other (See Comments)    C-Diff  . Eggs Or Egg-Derived Products   . Milk-Related Compounds     Unknown reaction  . Sulfa Antibiotics Diarrhea  . Sulfamethoxazole-Trimethoprim   . Fenofibrate Rash     REVIEW OF SYSTEMS:  GENERAL: no change in appetite, no fatigue, no weight changes, no fever, chills or weakness RESPIRATORY: no cough, SOB, DOE, wheezing, hemoptysis CARDIAC: no chest pain, or palpitations GI: no abdominal pain, diarrhea, constipation, heart burn, nausea or vomiting  PHYSICAL EXAMINATION  GENERAL: no acute distress, normal body habitus SKIN:  Left knee with aquacel dressing, no erythema EYES: conjunctivae normal,  sclerae normal, normal eye lids NECK: supple, trachea midline, no neck masses, no thyroid tenderness, no thyromegaly LYMPHATICS: no LAN in the neck, no supraclavicular LAN RESPIRATORY: breathing is even & unlabored, BS CTAB CARDIAC: RRR, no murmur,no extra heart sounds, RLE edema 1+ GI: abdomen soft, normal BS, no masses, no tenderness, no hepatomegaly, no splenomegaly EXTREMITIES: able to move X 4 extremities PSYCHIATRIC: the patient is alert & oriented to person, affect & behavior appropriate  LABS/RADIOLOGY: Labs reviewed: Basic Metabolic Panel:  Recent Labs  12/09/14 0929  NA 138  K 3.6  CL 102  CO2 27  GLUCOSE 78  BUN 10  CREATININE 0.56  CALCIUM 9.7   CBC:  Recent Labs  12/09/14 0929  WBC 6.4  NEUTROABS 4.3  HGB 13.9  HCT 41.9  MCV 88.2  PLT 298    Dg Chest 2 View  12/09/2014   CLINICAL DATA:  Preoperative evaluation for total knee arthroplasty. Hypertension  EXAM: CHEST  2 VIEW  COMPARISON:  November 02, 2014  FINDINGS: There is no edema or consolidation. Heart size and pulmonary vascularity are normal. No adenopathy. There is degenerative change in the thoracic spine.  IMPRESSION: No edema or consolidation.   Electronically Signed   By: Lowella Grip III M.D.   On: 12/09/2014 09:41    ASSESSMENT/PLAN:  Osteoarthritis S/P left total knee arthroplasty - for home health PT; continue aspirin 325 mg 1 tab by mouth twice a day for DVT prophylaxis; Robaxin 500 mg 1 tab by mouth every 6 hours when necessary for muscle spasm; and Percocet 5/325  mg 1-2 tabs by mouth every 6 hours when necessary Hypertension - recently increased HCTZ 25 mg 1 tab by mouth daily and continue lisinopril 10 mg by mouth daily Hypothyroidism - continue Synthroid 112 g by mouth daily Depression - mood is stable; continue Celexa 20 mg by mouth daily Constipation - continue MiraLAX 17 g by mouth twice a day when necessary and senna S2 tabs by mouth twice a day Anxiety - mood is is stable;  continue Ativan 0.5 mg by mouth daily at bedtime when necessary    I have filled out patient's discharge paperwork and written prescriptions.  Patient will receive home health PT.  Total discharge time: Less than 30 minutes  Discharge time involved coordination of the discharge process with Education officer, museum, nursing staff and therapy department. Medical justification for home health services verified.     [employer], NP [employer] [phone]

## 2015-01-01 DIAGNOSIS — F329 Major depressive disorder, single episode, unspecified: Secondary | ICD-10-CM | POA: Diagnosis not present

## 2015-01-01 DIAGNOSIS — Z471 Aftercare following joint replacement surgery: Secondary | ICD-10-CM | POA: Diagnosis not present

## 2015-01-01 DIAGNOSIS — I350 Nonrheumatic aortic (valve) stenosis: Secondary | ICD-10-CM | POA: Diagnosis not present

## 2015-01-01 DIAGNOSIS — R2681 Unsteadiness on feet: Secondary | ICD-10-CM | POA: Diagnosis not present

## 2015-01-01 DIAGNOSIS — T50B91D Poisoning by other viral vaccines, accidental (unintentional), subsequent encounter: Secondary | ICD-10-CM | POA: Diagnosis not present

## 2015-01-01 DIAGNOSIS — I10 Essential (primary) hypertension: Secondary | ICD-10-CM | POA: Diagnosis not present

## 2015-01-02 DIAGNOSIS — Z96652 Presence of left artificial knee joint: Secondary | ICD-10-CM | POA: Diagnosis not present

## 2015-01-02 DIAGNOSIS — Z471 Aftercare following joint replacement surgery: Secondary | ICD-10-CM | POA: Diagnosis not present

## 2015-01-03 DIAGNOSIS — R2681 Unsteadiness on feet: Secondary | ICD-10-CM | POA: Diagnosis not present

## 2015-01-03 DIAGNOSIS — I350 Nonrheumatic aortic (valve) stenosis: Secondary | ICD-10-CM | POA: Diagnosis not present

## 2015-01-03 DIAGNOSIS — T50B91D Poisoning by other viral vaccines, accidental (unintentional), subsequent encounter: Secondary | ICD-10-CM | POA: Diagnosis not present

## 2015-01-03 DIAGNOSIS — F329 Major depressive disorder, single episode, unspecified: Secondary | ICD-10-CM | POA: Diagnosis not present

## 2015-01-03 DIAGNOSIS — I10 Essential (primary) hypertension: Secondary | ICD-10-CM | POA: Diagnosis not present

## 2015-01-03 DIAGNOSIS — Z471 Aftercare following joint replacement surgery: Secondary | ICD-10-CM | POA: Diagnosis not present

## 2015-01-04 DIAGNOSIS — F329 Major depressive disorder, single episode, unspecified: Secondary | ICD-10-CM | POA: Diagnosis not present

## 2015-01-04 DIAGNOSIS — I350 Nonrheumatic aortic (valve) stenosis: Secondary | ICD-10-CM | POA: Diagnosis not present

## 2015-01-04 DIAGNOSIS — Z471 Aftercare following joint replacement surgery: Secondary | ICD-10-CM | POA: Diagnosis not present

## 2015-01-04 DIAGNOSIS — I10 Essential (primary) hypertension: Secondary | ICD-10-CM | POA: Diagnosis not present

## 2015-01-04 DIAGNOSIS — R2681 Unsteadiness on feet: Secondary | ICD-10-CM | POA: Diagnosis not present

## 2015-01-04 DIAGNOSIS — T50B91D Poisoning by other viral vaccines, accidental (unintentional), subsequent encounter: Secondary | ICD-10-CM | POA: Diagnosis not present

## 2015-01-06 DIAGNOSIS — I350 Nonrheumatic aortic (valve) stenosis: Secondary | ICD-10-CM | POA: Diagnosis not present

## 2015-01-06 DIAGNOSIS — T50B91D Poisoning by other viral vaccines, accidental (unintentional), subsequent encounter: Secondary | ICD-10-CM | POA: Diagnosis not present

## 2015-01-06 DIAGNOSIS — R2681 Unsteadiness on feet: Secondary | ICD-10-CM | POA: Diagnosis not present

## 2015-01-06 DIAGNOSIS — F329 Major depressive disorder, single episode, unspecified: Secondary | ICD-10-CM | POA: Diagnosis not present

## 2015-01-06 DIAGNOSIS — I10 Essential (primary) hypertension: Secondary | ICD-10-CM | POA: Diagnosis not present

## 2015-01-06 DIAGNOSIS — Z471 Aftercare following joint replacement surgery: Secondary | ICD-10-CM | POA: Diagnosis not present

## 2015-01-09 ENCOUNTER — Other Ambulatory Visit (HOSPITAL_COMMUNITY): Payer: Self-pay | Admitting: Orthopaedic Surgery

## 2015-01-09 DIAGNOSIS — Z471 Aftercare following joint replacement surgery: Secondary | ICD-10-CM | POA: Diagnosis not present

## 2015-01-09 DIAGNOSIS — R2681 Unsteadiness on feet: Secondary | ICD-10-CM | POA: Diagnosis not present

## 2015-01-09 DIAGNOSIS — I10 Essential (primary) hypertension: Secondary | ICD-10-CM | POA: Diagnosis not present

## 2015-01-09 DIAGNOSIS — T50B91D Poisoning by other viral vaccines, accidental (unintentional), subsequent encounter: Secondary | ICD-10-CM | POA: Diagnosis not present

## 2015-01-09 DIAGNOSIS — I350 Nonrheumatic aortic (valve) stenosis: Secondary | ICD-10-CM | POA: Diagnosis not present

## 2015-01-09 DIAGNOSIS — F329 Major depressive disorder, single episode, unspecified: Secondary | ICD-10-CM | POA: Diagnosis not present

## 2015-01-09 DIAGNOSIS — Z96652 Presence of left artificial knee joint: Secondary | ICD-10-CM

## 2015-01-10 ENCOUNTER — Ambulatory Visit (HOSPITAL_COMMUNITY)
Admission: RE | Admit: 2015-01-10 | Discharge: 2015-01-10 | Disposition: A | Discharge status: Home / Self Care | Payer: Medicare Other | Source: Ambulatory Visit | Attending: Orthopaedic Surgery | Admitting: Orthopaedic Surgery

## 2015-01-10 DIAGNOSIS — Z96652 Presence of left artificial knee joint: Secondary | ICD-10-CM

## 2015-01-10 DIAGNOSIS — R262 Difficulty in walking, not elsewhere classified: Secondary | ICD-10-CM | POA: Diagnosis not present

## 2015-01-10 DIAGNOSIS — M25562 Pain in left knee: Secondary | ICD-10-CM | POA: Diagnosis not present

## 2015-01-10 DIAGNOSIS — M25552 Pain in left hip: Secondary | ICD-10-CM | POA: Diagnosis not present

## 2015-01-10 DIAGNOSIS — M25662 Stiffness of left knee, not elsewhere classified: Secondary | ICD-10-CM | POA: Diagnosis not present

## 2015-01-10 NOTE — Progress Notes (Signed)
VASCULAR LAB PRELIMINARY  PRELIMINARY  PRELIMINARY  PRELIMINARY  LLEV completed.    Preliminary report:  Negative DVT left lower extremity venous duplex.  Negative Baker's Cyst.  August Albino, RVT 01/10/2015, 11:48 AM

## 2015-01-12 DIAGNOSIS — R262 Difficulty in walking, not elsewhere classified: Secondary | ICD-10-CM | POA: Diagnosis not present

## 2015-01-12 DIAGNOSIS — M25552 Pain in left hip: Secondary | ICD-10-CM | POA: Diagnosis not present

## 2015-01-12 DIAGNOSIS — M25662 Stiffness of left knee, not elsewhere classified: Secondary | ICD-10-CM | POA: Diagnosis not present

## 2015-01-12 DIAGNOSIS — Z96652 Presence of left artificial knee joint: Secondary | ICD-10-CM | POA: Diagnosis not present

## 2015-01-16 DIAGNOSIS — Z96652 Presence of left artificial knee joint: Secondary | ICD-10-CM | POA: Diagnosis not present

## 2015-01-16 DIAGNOSIS — Z471 Aftercare following joint replacement surgery: Secondary | ICD-10-CM | POA: Diagnosis not present

## 2015-01-17 DIAGNOSIS — M25662 Stiffness of left knee, not elsewhere classified: Secondary | ICD-10-CM | POA: Diagnosis not present

## 2015-01-17 DIAGNOSIS — Z96652 Presence of left artificial knee joint: Secondary | ICD-10-CM | POA: Diagnosis not present

## 2015-01-17 DIAGNOSIS — R262 Difficulty in walking, not elsewhere classified: Secondary | ICD-10-CM | POA: Diagnosis not present

## 2015-01-17 DIAGNOSIS — M25562 Pain in left knee: Secondary | ICD-10-CM | POA: Diagnosis not present

## 2015-01-19 DIAGNOSIS — M25662 Stiffness of left knee, not elsewhere classified: Secondary | ICD-10-CM | POA: Diagnosis not present

## 2015-01-19 DIAGNOSIS — R262 Difficulty in walking, not elsewhere classified: Secondary | ICD-10-CM | POA: Diagnosis not present

## 2015-01-19 DIAGNOSIS — Z96652 Presence of left artificial knee joint: Secondary | ICD-10-CM | POA: Diagnosis not present

## 2015-01-19 DIAGNOSIS — M25562 Pain in left knee: Secondary | ICD-10-CM | POA: Diagnosis not present

## 2015-01-24 DIAGNOSIS — R262 Difficulty in walking, not elsewhere classified: Secondary | ICD-10-CM | POA: Diagnosis not present

## 2015-01-24 DIAGNOSIS — M25662 Stiffness of left knee, not elsewhere classified: Secondary | ICD-10-CM | POA: Diagnosis not present

## 2015-01-24 DIAGNOSIS — M25562 Pain in left knee: Secondary | ICD-10-CM | POA: Diagnosis not present

## 2015-01-24 DIAGNOSIS — Z96652 Presence of left artificial knee joint: Secondary | ICD-10-CM | POA: Diagnosis not present

## 2015-01-26 DIAGNOSIS — M25662 Stiffness of left knee, not elsewhere classified: Secondary | ICD-10-CM | POA: Diagnosis not present

## 2015-01-26 DIAGNOSIS — M25562 Pain in left knee: Secondary | ICD-10-CM | POA: Diagnosis not present

## 2015-01-26 DIAGNOSIS — Z96652 Presence of left artificial knee joint: Secondary | ICD-10-CM | POA: Diagnosis not present

## 2015-01-26 DIAGNOSIS — R262 Difficulty in walking, not elsewhere classified: Secondary | ICD-10-CM | POA: Diagnosis not present

## 2015-01-30 DIAGNOSIS — M545 Low back pain: Secondary | ICD-10-CM | POA: Diagnosis not present

## 2015-01-30 DIAGNOSIS — M25562 Pain in left knee: Secondary | ICD-10-CM | POA: Diagnosis not present

## 2015-01-31 DIAGNOSIS — Z96652 Presence of left artificial knee joint: Secondary | ICD-10-CM | POA: Diagnosis not present

## 2015-01-31 DIAGNOSIS — M25562 Pain in left knee: Secondary | ICD-10-CM | POA: Diagnosis not present

## 2015-01-31 DIAGNOSIS — R262 Difficulty in walking, not elsewhere classified: Secondary | ICD-10-CM | POA: Diagnosis not present

## 2015-01-31 DIAGNOSIS — M25662 Stiffness of left knee, not elsewhere classified: Secondary | ICD-10-CM | POA: Diagnosis not present

## 2015-02-02 DIAGNOSIS — M25662 Stiffness of left knee, not elsewhere classified: Secondary | ICD-10-CM | POA: Diagnosis not present

## 2015-02-02 DIAGNOSIS — R262 Difficulty in walking, not elsewhere classified: Secondary | ICD-10-CM | POA: Diagnosis not present

## 2015-02-02 DIAGNOSIS — M25562 Pain in left knee: Secondary | ICD-10-CM | POA: Diagnosis not present

## 2015-02-02 DIAGNOSIS — Z96652 Presence of left artificial knee joint: Secondary | ICD-10-CM | POA: Diagnosis not present

## 2015-02-07 DIAGNOSIS — R262 Difficulty in walking, not elsewhere classified: Secondary | ICD-10-CM | POA: Diagnosis not present

## 2015-02-07 DIAGNOSIS — M25662 Stiffness of left knee, not elsewhere classified: Secondary | ICD-10-CM | POA: Diagnosis not present

## 2015-02-07 DIAGNOSIS — M25562 Pain in left knee: Secondary | ICD-10-CM | POA: Diagnosis not present

## 2015-02-07 DIAGNOSIS — Z96652 Presence of left artificial knee joint: Secondary | ICD-10-CM | POA: Diagnosis not present

## 2015-02-09 DIAGNOSIS — M25662 Stiffness of left knee, not elsewhere classified: Secondary | ICD-10-CM | POA: Diagnosis not present

## 2015-02-09 DIAGNOSIS — Z96652 Presence of left artificial knee joint: Secondary | ICD-10-CM | POA: Diagnosis not present

## 2015-02-09 DIAGNOSIS — M25562 Pain in left knee: Secondary | ICD-10-CM | POA: Diagnosis not present

## 2015-02-09 DIAGNOSIS — R262 Difficulty in walking, not elsewhere classified: Secondary | ICD-10-CM | POA: Diagnosis not present

## 2015-02-14 DIAGNOSIS — Z96652 Presence of left artificial knee joint: Secondary | ICD-10-CM | POA: Diagnosis not present

## 2015-02-14 DIAGNOSIS — R262 Difficulty in walking, not elsewhere classified: Secondary | ICD-10-CM | POA: Diagnosis not present

## 2015-02-14 DIAGNOSIS — M25562 Pain in left knee: Secondary | ICD-10-CM | POA: Diagnosis not present

## 2015-02-14 DIAGNOSIS — M25662 Stiffness of left knee, not elsewhere classified: Secondary | ICD-10-CM | POA: Diagnosis not present

## 2015-02-16 DIAGNOSIS — M25662 Stiffness of left knee, not elsewhere classified: Secondary | ICD-10-CM | POA: Diagnosis not present

## 2015-02-16 DIAGNOSIS — M25562 Pain in left knee: Secondary | ICD-10-CM | POA: Diagnosis not present

## 2015-02-16 DIAGNOSIS — R262 Difficulty in walking, not elsewhere classified: Secondary | ICD-10-CM | POA: Diagnosis not present

## 2015-02-16 DIAGNOSIS — Z96652 Presence of left artificial knee joint: Secondary | ICD-10-CM | POA: Diagnosis not present

## 2015-02-21 DIAGNOSIS — F419 Anxiety disorder, unspecified: Secondary | ICD-10-CM | POA: Diagnosis not present

## 2015-02-21 DIAGNOSIS — Z0000 Encounter for general adult medical examination without abnormal findings: Secondary | ICD-10-CM | POA: Diagnosis not present

## 2015-02-21 DIAGNOSIS — M25562 Pain in left knee: Secondary | ICD-10-CM | POA: Diagnosis not present

## 2015-02-21 DIAGNOSIS — I10 Essential (primary) hypertension: Secondary | ICD-10-CM | POA: Diagnosis not present

## 2015-02-21 DIAGNOSIS — Z96652 Presence of left artificial knee joint: Secondary | ICD-10-CM | POA: Diagnosis not present

## 2015-02-21 DIAGNOSIS — M25662 Stiffness of left knee, not elsewhere classified: Secondary | ICD-10-CM | POA: Diagnosis not present

## 2015-02-21 DIAGNOSIS — R262 Difficulty in walking, not elsewhere classified: Secondary | ICD-10-CM | POA: Diagnosis not present

## 2015-02-21 DIAGNOSIS — F339 Major depressive disorder, recurrent, unspecified: Secondary | ICD-10-CM | POA: Diagnosis not present

## 2015-02-23 DIAGNOSIS — Z96652 Presence of left artificial knee joint: Secondary | ICD-10-CM | POA: Diagnosis not present

## 2015-02-23 DIAGNOSIS — M25562 Pain in left knee: Secondary | ICD-10-CM | POA: Diagnosis not present

## 2015-02-23 DIAGNOSIS — R262 Difficulty in walking, not elsewhere classified: Secondary | ICD-10-CM | POA: Diagnosis not present

## 2015-02-23 DIAGNOSIS — M25662 Stiffness of left knee, not elsewhere classified: Secondary | ICD-10-CM | POA: Diagnosis not present

## 2015-02-24 DIAGNOSIS — Z471 Aftercare following joint replacement surgery: Secondary | ICD-10-CM | POA: Diagnosis not present

## 2015-02-24 DIAGNOSIS — Z96652 Presence of left artificial knee joint: Secondary | ICD-10-CM | POA: Diagnosis not present

## 2015-02-28 DIAGNOSIS — Z96652 Presence of left artificial knee joint: Secondary | ICD-10-CM | POA: Diagnosis not present

## 2015-02-28 DIAGNOSIS — M25562 Pain in left knee: Secondary | ICD-10-CM | POA: Diagnosis not present

## 2015-02-28 DIAGNOSIS — M25662 Stiffness of left knee, not elsewhere classified: Secondary | ICD-10-CM | POA: Diagnosis not present

## 2015-02-28 DIAGNOSIS — M545 Low back pain: Secondary | ICD-10-CM | POA: Diagnosis not present

## 2015-03-01 IMAGING — CT CT ABD-PELV W/ CM
3 of 5 series · 13 of 36 positions shown, 19 images · IV contrast (READICAT/WATER & [ID] OMNI 300)
Comparison: None.

CLINICAL DATA: Abdominal pain, bloating. Prior right inguinal
repair. Right inguinal discomfort.

EXAM:
CT ABDOMEN AND PELVIS WITH CONTRAST
TECHNIQUE: Multidetector CT imaging of the abdomen and pelvis was performed
using the standard protocol following bolus administration of
intravenous contrast.
CONTRAST:  100mL OMNIPAQUE IOHEXOL 300 MG/ML  SOLN

[Series 3: abd/pelvis with · axial · 0.66mm/px · z∈[-291,+29]mm · 7 of 82 slices shown, 12 images]
[im 11/82  soft-tissue]
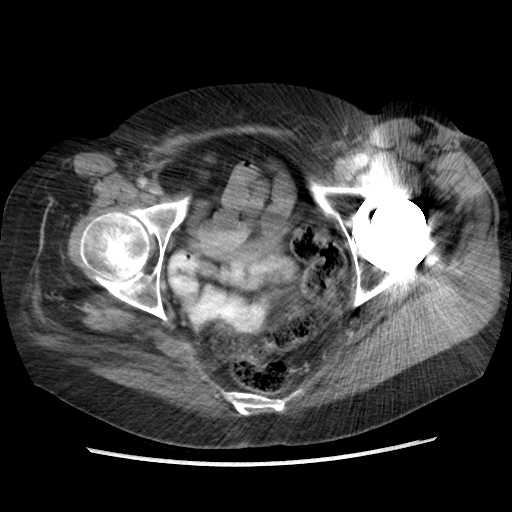
[im 11/82  bone]
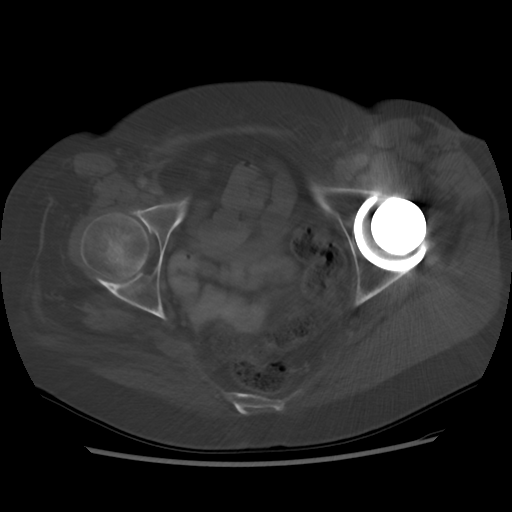
[im 21/82  soft-tissue]
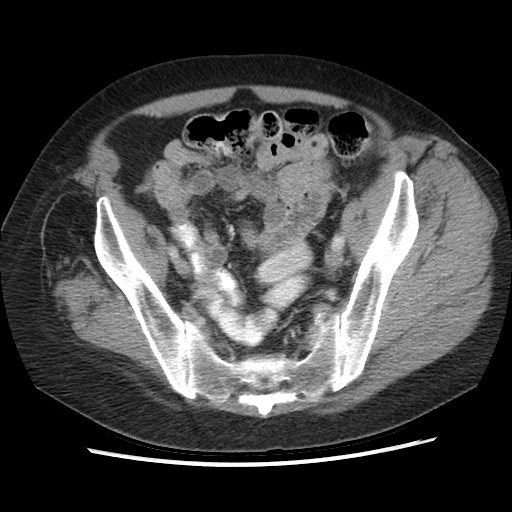
[im 31/82  soft-tissue]
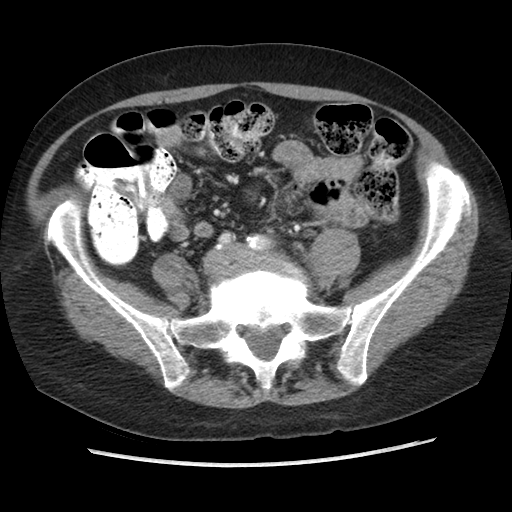
[im 41/82  soft-tissue]
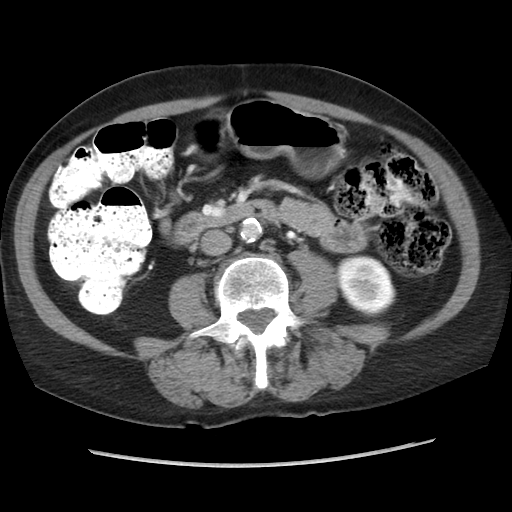
[im 41/82  lung]
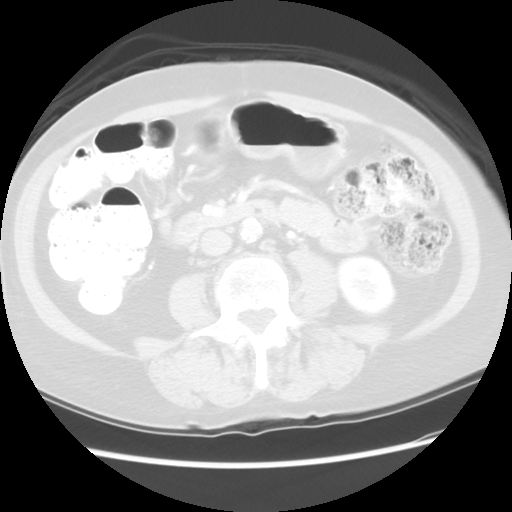
[im 51/82  soft-tissue]
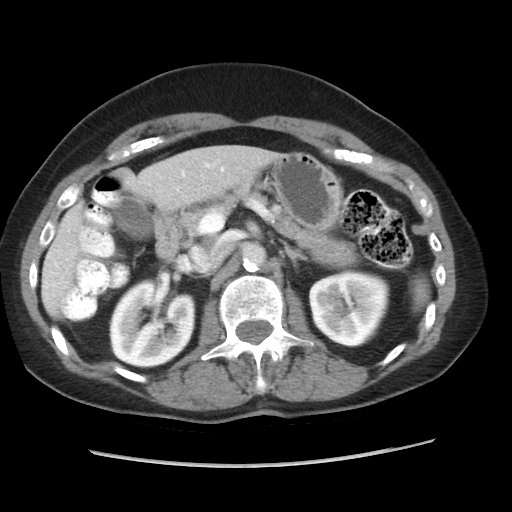
[im 51/82  lung]
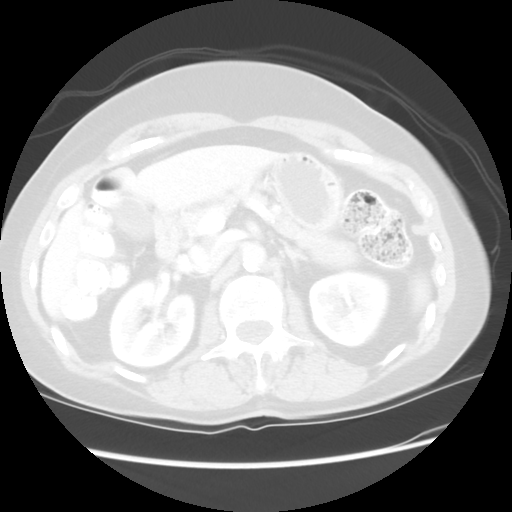
[im 61/82  soft-tissue]
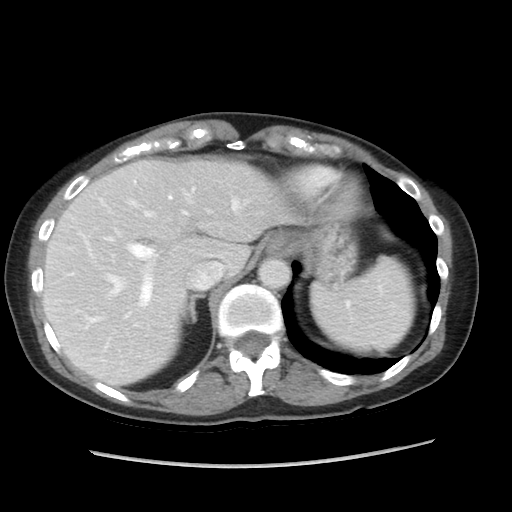
[im 61/82  lung]
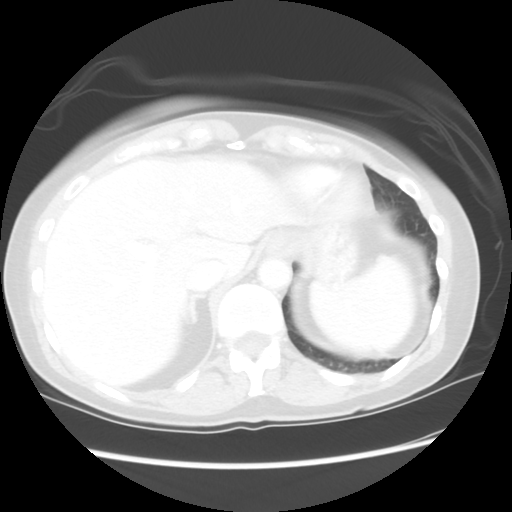
[im 71/82  soft-tissue]
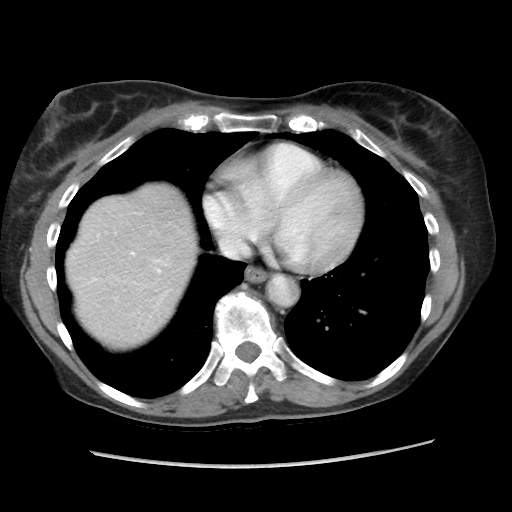
[im 71/82  lung]
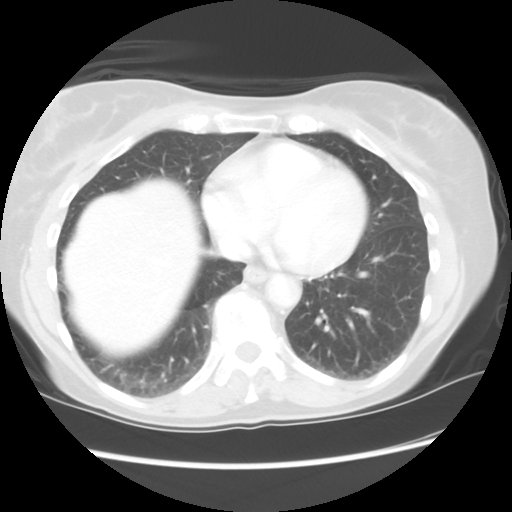

[Series 601: coronal body · coronal · 0.89mm/px · 1 of 111 slices shown, 2 images]
[im 37/111  soft-tissue]
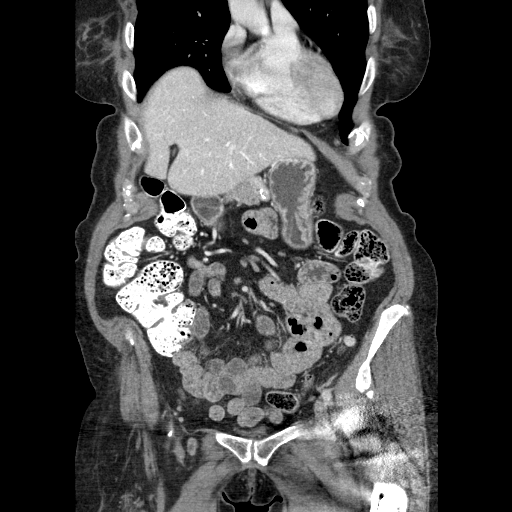
[im 37/111  bone]
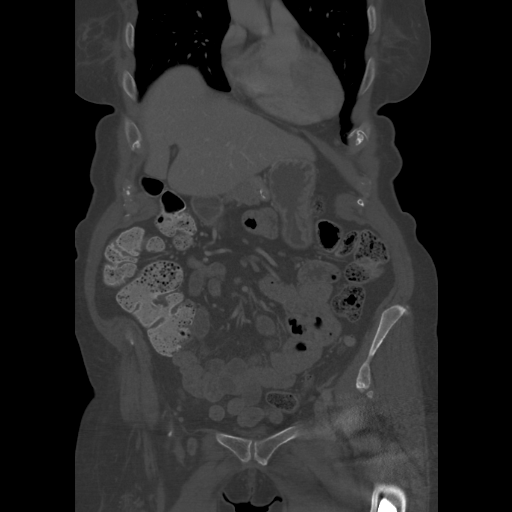

[Series 602: sagittal body · sagittal · 0.89mm/px · 5 of 137 slices shown]
[im 10/137  soft-tissue]
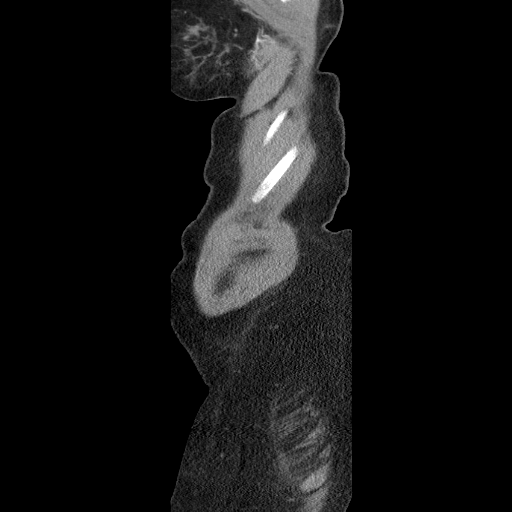
[im 28/137  soft-tissue]
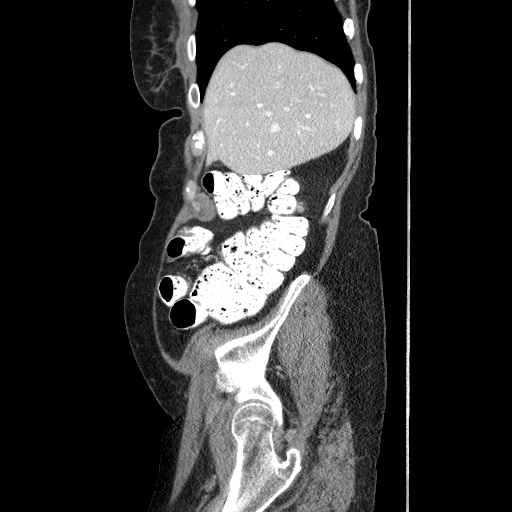
[im 46/137  soft-tissue]
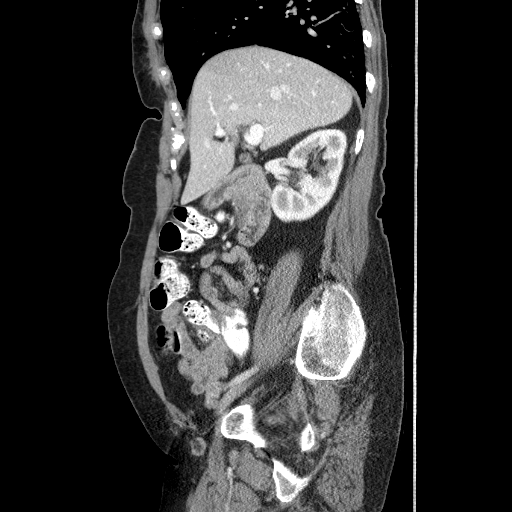
[im 64/137  soft-tissue]
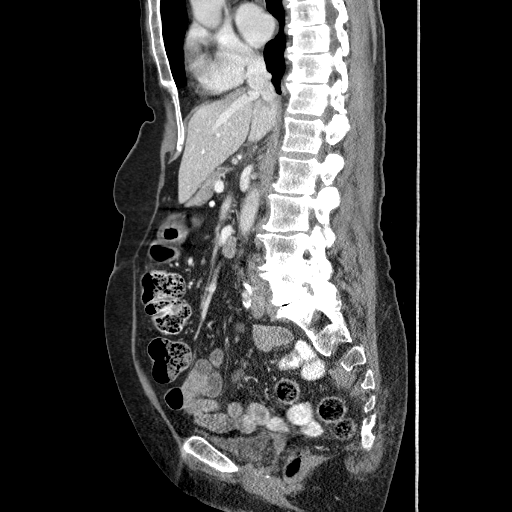
[im 73/137  soft-tissue]
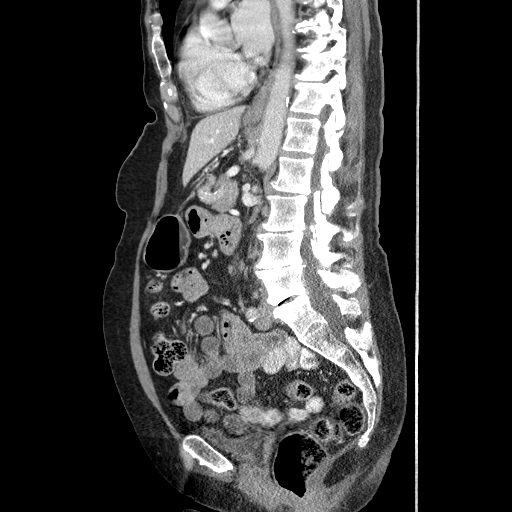

[13 of 36 positions shown; findings below may reference images not displayed]

FINDINGS: Scattered coronary artery calcifications in the left coronary
artery. Lung bases are clear. No effusions. Heart is normal size.

Liver, spleen, adrenals and kidneys are unremarkable. Small
hypodensity centrally within the right kidney, likely small cysts.
No hydronephrosis.

Large stool burden throughout the colon. Scattered left colonic
diverticula without active diverticulitis. Small bowel is
decompressed.

Calcifications noted along the anterior aspect of the spleen without
visible underlying lesion. This may be related to chronic/ prior
pancreatitis.

Aorta and iliac vessels are calcified, non aneurysmal. No free
fluid, free air or adenopathy.

Small left inguinal hernia containing fat. No abnormality within the
right inguinal region. No adenopathy.

No acute bony abnormality. Degenerative changes in the thoracolumbar
spine.
IMPRESSION: Large stool burden throughout the colon.

Scattered diverticula without active scattered coronary artery
calcifications.

Small left inguinal hernia.

## 2015-03-02 DIAGNOSIS — Z96652 Presence of left artificial knee joint: Secondary | ICD-10-CM | POA: Diagnosis not present

## 2015-03-02 DIAGNOSIS — M25562 Pain in left knee: Secondary | ICD-10-CM | POA: Diagnosis not present

## 2015-03-02 DIAGNOSIS — M545 Low back pain: Secondary | ICD-10-CM | POA: Diagnosis not present

## 2015-03-02 DIAGNOSIS — M25662 Stiffness of left knee, not elsewhere classified: Secondary | ICD-10-CM | POA: Diagnosis not present

## 2015-03-07 DIAGNOSIS — M545 Low back pain: Secondary | ICD-10-CM | POA: Diagnosis not present

## 2015-03-07 DIAGNOSIS — Z96652 Presence of left artificial knee joint: Secondary | ICD-10-CM | POA: Diagnosis not present

## 2015-03-07 DIAGNOSIS — M25662 Stiffness of left knee, not elsewhere classified: Secondary | ICD-10-CM | POA: Diagnosis not present

## 2015-03-07 DIAGNOSIS — M25562 Pain in left knee: Secondary | ICD-10-CM | POA: Diagnosis not present

## 2015-03-09 DIAGNOSIS — Z96652 Presence of left artificial knee joint: Secondary | ICD-10-CM | POA: Diagnosis not present

## 2015-03-09 DIAGNOSIS — M25562 Pain in left knee: Secondary | ICD-10-CM | POA: Diagnosis not present

## 2015-03-09 DIAGNOSIS — M545 Low back pain: Secondary | ICD-10-CM | POA: Diagnosis not present

## 2015-03-09 DIAGNOSIS — M25662 Stiffness of left knee, not elsewhere classified: Secondary | ICD-10-CM | POA: Diagnosis not present

## 2015-03-21 DIAGNOSIS — Z96652 Presence of left artificial knee joint: Secondary | ICD-10-CM | POA: Diagnosis not present

## 2015-03-21 DIAGNOSIS — M25562 Pain in left knee: Secondary | ICD-10-CM | POA: Diagnosis not present

## 2015-03-21 DIAGNOSIS — M545 Low back pain: Secondary | ICD-10-CM | POA: Diagnosis not present

## 2015-03-21 DIAGNOSIS — M25662 Stiffness of left knee, not elsewhere classified: Secondary | ICD-10-CM | POA: Diagnosis not present

## 2015-03-23 DIAGNOSIS — M25562 Pain in left knee: Secondary | ICD-10-CM | POA: Diagnosis not present

## 2015-03-23 DIAGNOSIS — M25662 Stiffness of left knee, not elsewhere classified: Secondary | ICD-10-CM | POA: Diagnosis not present

## 2015-03-23 DIAGNOSIS — M545 Low back pain: Secondary | ICD-10-CM | POA: Diagnosis not present

## 2015-03-23 DIAGNOSIS — Z96652 Presence of left artificial knee joint: Secondary | ICD-10-CM | POA: Diagnosis not present

## 2015-03-24 DIAGNOSIS — M25562 Pain in left knee: Secondary | ICD-10-CM | POA: Diagnosis not present

## 2015-04-07 DIAGNOSIS — E049 Nontoxic goiter, unspecified: Secondary | ICD-10-CM | POA: Diagnosis not present

## 2015-04-07 DIAGNOSIS — E063 Autoimmune thyroiditis: Secondary | ICD-10-CM | POA: Diagnosis not present

## 2015-04-07 DIAGNOSIS — E039 Hypothyroidism, unspecified: Secondary | ICD-10-CM | POA: Diagnosis not present

## 2015-04-28 DIAGNOSIS — Z1231 Encounter for screening mammogram for malignant neoplasm of breast: Secondary | ICD-10-CM | POA: Diagnosis not present

## 2015-05-08 ENCOUNTER — Ambulatory Visit: Payer: Self-pay | Admitting: Cardiology

## 2015-05-16 ENCOUNTER — Encounter: Payer: Self-pay | Admitting: Cardiology

## 2015-05-16 ENCOUNTER — Ambulatory Visit (INDEPENDENT_AMBULATORY_CARE_PROVIDER_SITE_OTHER): Payer: Medicare Other | Admitting: Cardiology

## 2015-05-16 VITALS — BP 142/72 | HR 67 | Ht 66.0 in | Wt 145.8 lb

## 2015-05-16 DIAGNOSIS — I350 Nonrheumatic aortic (valve) stenosis: Secondary | ICD-10-CM

## 2015-05-16 DIAGNOSIS — I10 Essential (primary) hypertension: Secondary | ICD-10-CM | POA: Diagnosis not present

## 2015-05-16 NOTE — Patient Instructions (Signed)
Your physician wants you to follow-up in: 2 Years You will receive a reminder letter in the mail two months in advance. If you don't receive a letter, please call our office to schedule the follow-up appointment.  

## 2015-05-16 NOTE — Progress Notes (Signed)
HPI The patient presents as a new patient. She has a history of a bicuspid aortic valve.  She has had a mild gradient and normal BNP. She's here for follow-up. Since I last saw her she had knee surgery and is recovering from this and doing physical therapy. She has done relatively well with this. He denies any new cardiovascular symptoms.  She does have dyspnea but has muscle weakness secondary to polio.  She continues to have DOE with household activities.  However, this is at baseline.   The patient denies any new symptoms such as chest discomfort, neck or arm discomfort. There has been no PND or orthopnea. There have been no reported palpitations, presyncope or syncope.    Allergies  Allergen Reactions  . Penicillins Rash  . Azithromycin   . Clindamycin/Lincomycin Diarrhea and Other (See Comments)    C-Diff  . Eggs Or Egg-Derived Products   . Milk-Related Compounds     Unknown reaction  . Sulfa Antibiotics Diarrhea  . Sulfamethoxazole-Trimethoprim   . Fenofibrate Rash    Current Outpatient Prescriptions  Medication Sig Dispense Refill  . ALPHA LIPOIC ACID PO Take by mouth daily.    Marland Kitchen aspirin EC 325 MG EC tablet Take 1 tablet (325 mg total) by mouth 2 (two) times daily after a meal. 30 tablet 0  . b complex vitamins tablet Take 1 tablet by mouth daily.    . benazepril (LOTENSIN) 10 MG tablet Take 10 mg by mouth daily.    . Biotin 1000 MCG tablet Take 1,000 mcg by mouth daily.    . Cholecalciferol (VITAMIN D-3) 1000 UNITS CAPS Take 2 capsules by mouth daily.    . citalopram (CELEXA) 20 MG tablet Take 20 mg by mouth daily.    Marland Kitchen co-enzyme Q-10 50 MG capsule Take 50 mg by mouth daily.    Marland Kitchen HAWTHORN EXTRACT PO Take by mouth daily.    . hydrochlorothiazide (MICROZIDE) 12.5 MG capsule Take 1 capsule (12.5 mg total) by mouth daily. 90 capsule 3  . lactobacillus acidophilus (BACID) TABS tablet Take 2 tablets by mouth daily.    Marland Kitchen levothyroxine (SYNTHROID, LEVOTHROID) 100 MCG tablet Take 100  mcg by mouth daily before breakfast.    . LORazepam (ATIVAN) 0.5 MG tablet Take 0.5 mg by mouth at bedtime as needed for sleep. For sleep/anxiety.    . Magnesium 250 MG TABS Take by mouth daily.    . Multiple Vitamins-Minerals (MULTIVITAL) tablet Take 1 tablet by mouth daily.    . niacin 500 MG tablet Take 500 mg by mouth at bedtime.    . NON FORMULARY 580 mg daily. Bladderwrack    . NON FORMULARY Place under the tongue. [place] Flower Essence    . OVER THE COUNTER MEDICATION daily. IBS Max    . potassium gluconate 595 MG TABS tablet Take 595 mg by mouth daily.    . Selenium (SELENIMIN PO) Take by mouth daily.    Marland Kitchen VITAMIN K PO Take by mouth as directed.     No current facility-administered medications for this visit.    Past Medical History  Diagnosis Date  . Polio   . Hypertension   . Bicuspid aortic valve   . Thyroid disease   . Hypothyroidism   . Anxiety   . Depression   . Arthritis     Past Surgical History  Procedure Laterality Date  . Hernia repair    . Joint replacement      left hip  . Tonsillectomy    .  Breast surgery    . Radical vaginal hysterectomy  1993  . Knee surgery      meniscus  . Bunionectomy    . Hammer toe surgery    . Breast enhancement surgery    . Abdominal hysterectomy    . Eye surgery    . Cardiac catheterization    . Total knee arthroplasty Left 12/20/2014    Procedure: LEFT TOTAL KNEE ARTHROPLASTY;  Surgeon: Melrose Nakayama, MD;  Location: [place];  Service: Orthopedics;  Laterality: Left;    ROS:  As stated in the HPI and negative for all other systems.  PHYSICAL EXAM BP 142/72 mmHg  Pulse 67  Ht 5\' 6"  (1.676 m)  Wt 145 lb 12.8 oz (66.134 kg)  BMI 23.54 kg/m2 GENERAL:  Well appearing NECK:  No jugular venous distention, waveform within normal limits, carotid upstroke brisk and symmetric, no bruits, no thyromegaly LUNGS:  Clear to auscultation bilaterally CHEST:  Unremarkable HEART:  PMI not displaced or sustained,S1 and S2 within  normal limits, no S3, no S4, no clicks, no rubs, 2/6 apical early peaking systolic murmur, no diastolic murmurs ABD:  Flat, positive bowel sounds normal in frequency in pitch, no bruits, no rebound, no guarding, no midline pulsatile mass, no hepatomegaly, no splenomegaly EXT:  2 plus pulses upper, no edema, no cyanosis no clubbing, right leg is smaller secondary to polio, diminished dorsalis pedis and posterior tibials bilaterally   EKG:  Sinus rhythm, rate 67, axis within normal limits, intervals within normal limits, no acute ST-T wave changes. 05/16/2015  ASSESSMENT AND PLAN  AORTIC STENOSIS:  This has been mild and I don't expect that it is changed.  She will continue the meds as listed.   HTN:  Her BP is very slightly elevated.  However,this is unusual.  She will continue the meds as listed.   PVD:  She reports a history of this and does have reduced pulses.  She will continue with risk reduction.   She has no new symptoms.

## 2015-05-18 DIAGNOSIS — M859 Disorder of bone density and structure, unspecified: Secondary | ICD-10-CM | POA: Diagnosis not present

## 2015-05-18 DIAGNOSIS — Z780 Asymptomatic menopausal state: Secondary | ICD-10-CM | POA: Diagnosis not present

## 2015-06-02 DIAGNOSIS — E039 Hypothyroidism, unspecified: Secondary | ICD-10-CM | POA: Diagnosis not present

## 2015-06-14 DIAGNOSIS — M25562 Pain in left knee: Secondary | ICD-10-CM | POA: Diagnosis not present

## 2015-06-19 DIAGNOSIS — E559 Vitamin D deficiency, unspecified: Secondary | ICD-10-CM | POA: Diagnosis not present

## 2015-06-19 DIAGNOSIS — I10 Essential (primary) hypertension: Secondary | ICD-10-CM | POA: Diagnosis not present

## 2015-06-19 DIAGNOSIS — F339 Major depressive disorder, recurrent, unspecified: Secondary | ICD-10-CM | POA: Diagnosis not present

## 2015-06-19 DIAGNOSIS — F419 Anxiety disorder, unspecified: Secondary | ICD-10-CM | POA: Diagnosis not present

## 2015-06-19 DIAGNOSIS — I739 Peripheral vascular disease, unspecified: Secondary | ICD-10-CM | POA: Diagnosis not present

## 2015-06-19 DIAGNOSIS — E785 Hyperlipidemia, unspecified: Secondary | ICD-10-CM | POA: Diagnosis not present

## 2015-07-05 DIAGNOSIS — H43813 Vitreous degeneration, bilateral: Secondary | ICD-10-CM | POA: Diagnosis not present

## 2015-07-05 DIAGNOSIS — D3131 Benign neoplasm of right choroid: Secondary | ICD-10-CM | POA: Diagnosis not present

## 2015-07-05 DIAGNOSIS — H26493 Other secondary cataract, bilateral: Secondary | ICD-10-CM | POA: Diagnosis not present

## 2015-07-05 DIAGNOSIS — H01025 Squamous blepharitis left lower eyelid: Secondary | ICD-10-CM | POA: Diagnosis not present

## 2015-07-05 DIAGNOSIS — H01021 Squamous blepharitis right upper eyelid: Secondary | ICD-10-CM | POA: Diagnosis not present

## 2015-07-05 DIAGNOSIS — H01022 Squamous blepharitis right lower eyelid: Secondary | ICD-10-CM | POA: Diagnosis not present

## 2015-07-05 DIAGNOSIS — H01024 Squamous blepharitis left upper eyelid: Secondary | ICD-10-CM | POA: Diagnosis not present

## 2015-07-05 DIAGNOSIS — Z961 Presence of intraocular lens: Secondary | ICD-10-CM | POA: Diagnosis not present

## 2015-07-25 ENCOUNTER — Other Ambulatory Visit (HOSPITAL_BASED_OUTPATIENT_CLINIC_OR_DEPARTMENT_OTHER): Payer: Self-pay | Admitting: Family Medicine

## 2015-07-25 ENCOUNTER — Ambulatory Visit (HOSPITAL_BASED_OUTPATIENT_CLINIC_OR_DEPARTMENT_OTHER)
Admission: RE | Admit: 2015-07-25 | Discharge: 2015-07-25 | Disposition: A | Discharge status: Home / Self Care | Payer: Medicare Other | Source: Ambulatory Visit | Attending: Family Medicine | Admitting: Family Medicine

## 2015-07-25 DIAGNOSIS — W19XXXA Unspecified fall, initial encounter: Secondary | ICD-10-CM

## 2015-07-25 DIAGNOSIS — R51 Headache: Secondary | ICD-10-CM

## 2015-07-25 DIAGNOSIS — R202 Paresthesia of skin: Secondary | ICD-10-CM | POA: Insufficient documentation

## 2015-07-25 DIAGNOSIS — G44309 Post-traumatic headache, unspecified, not intractable: Secondary | ICD-10-CM | POA: Diagnosis not present

## 2015-07-25 DIAGNOSIS — R519 Headache, unspecified: Secondary | ICD-10-CM

## 2015-07-25 DIAGNOSIS — S0990XS Unspecified injury of head, sequela: Secondary | ICD-10-CM | POA: Diagnosis not present

## 2015-08-31 DIAGNOSIS — H11121 Conjunctival concretions, right eye: Secondary | ICD-10-CM | POA: Diagnosis not present

## 2015-08-31 DIAGNOSIS — H26493 Other secondary cataract, bilateral: Secondary | ICD-10-CM | POA: Diagnosis not present

## 2015-08-31 DIAGNOSIS — Z961 Presence of intraocular lens: Secondary | ICD-10-CM | POA: Diagnosis not present

## 2015-08-31 DIAGNOSIS — H10811 Pingueculitis, right eye: Secondary | ICD-10-CM | POA: Diagnosis not present

## 2015-09-14 DIAGNOSIS — H11823 Conjunctivochalasis, bilateral: Secondary | ICD-10-CM | POA: Diagnosis not present

## 2015-09-14 DIAGNOSIS — H01025 Squamous blepharitis left lower eyelid: Secondary | ICD-10-CM | POA: Diagnosis not present

## 2015-09-14 DIAGNOSIS — H10413 Chronic giant papillary conjunctivitis, bilateral: Secondary | ICD-10-CM | POA: Diagnosis not present

## 2015-09-14 DIAGNOSIS — H01022 Squamous blepharitis right lower eyelid: Secondary | ICD-10-CM | POA: Diagnosis not present

## 2015-09-14 DIAGNOSIS — H11123 Conjunctival concretions, bilateral: Secondary | ICD-10-CM | POA: Diagnosis not present

## 2015-09-14 DIAGNOSIS — H11153 Pinguecula, bilateral: Secondary | ICD-10-CM | POA: Diagnosis not present

## 2015-09-14 DIAGNOSIS — Z961 Presence of intraocular lens: Secondary | ICD-10-CM | POA: Diagnosis not present

## 2015-09-14 DIAGNOSIS — H01021 Squamous blepharitis right upper eyelid: Secondary | ICD-10-CM | POA: Diagnosis not present

## 2015-09-14 DIAGNOSIS — H01024 Squamous blepharitis left upper eyelid: Secondary | ICD-10-CM | POA: Diagnosis not present

## 2015-10-01 IMAGING — US US SOFT TISSUE HEAD/NECK
1 series · 14 of 25 positions shown · non-contrast
Comparison: None.

CLINICAL DATA: Goiter

EXAM:
THYROID ULTRASOUND
TECHNIQUE: Ultrasound examination of the thyroid gland and adjacent soft
tissues was performed.

[Series 1: us soft tissue head/neck · 0.10mm/px · 14 of 46 slices shown]
[im 1/46]
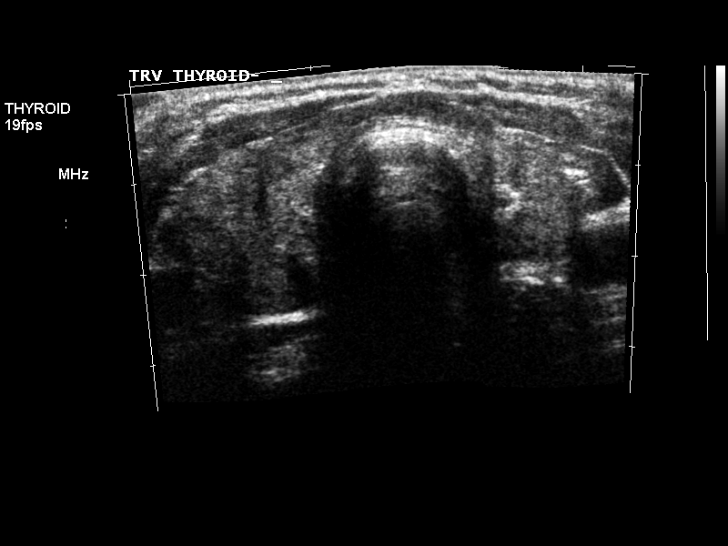
[im 4/46]
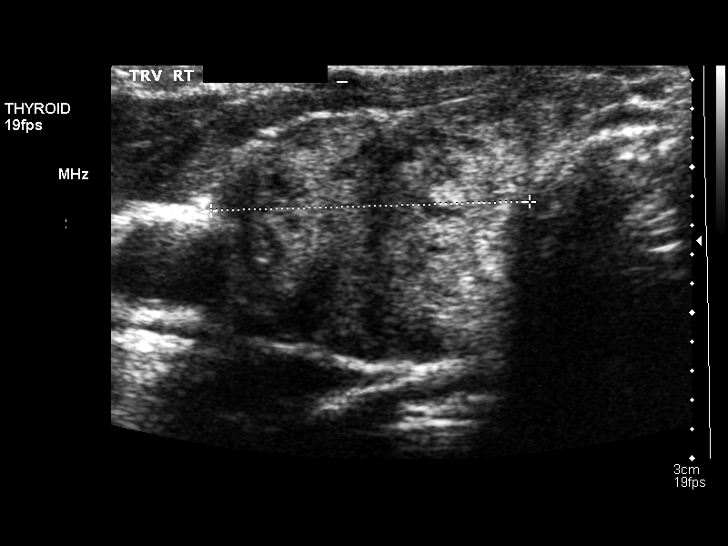
[im 8/46]
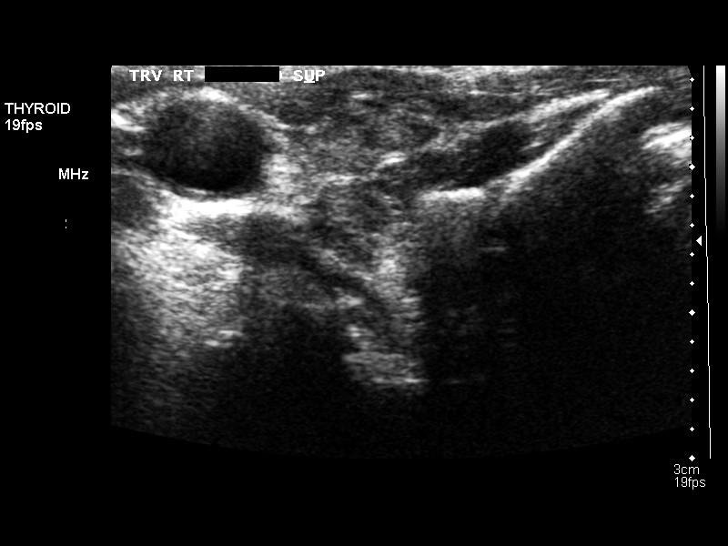
[im 12/46]
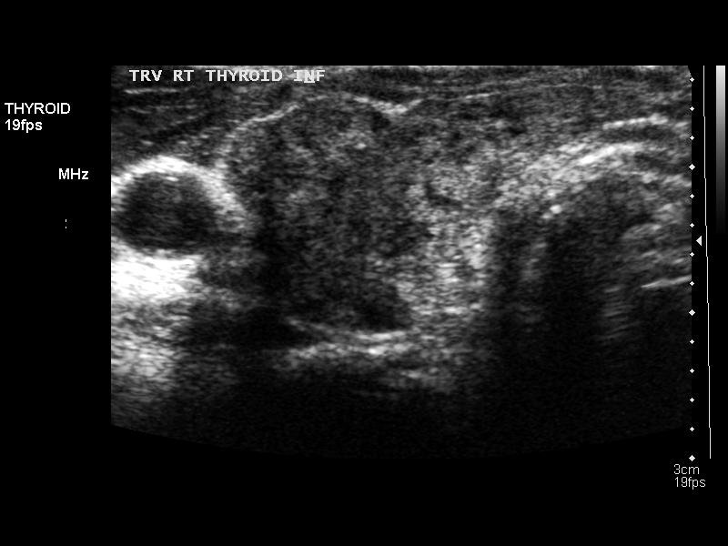
[im 16/46]
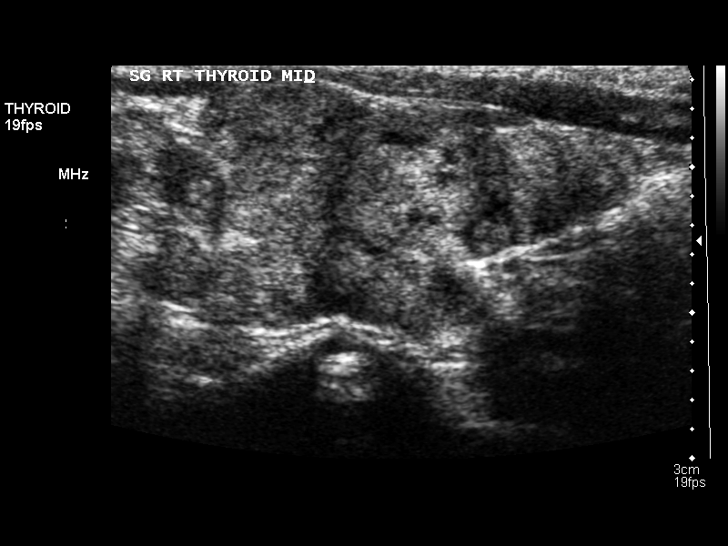
[im 17/46]
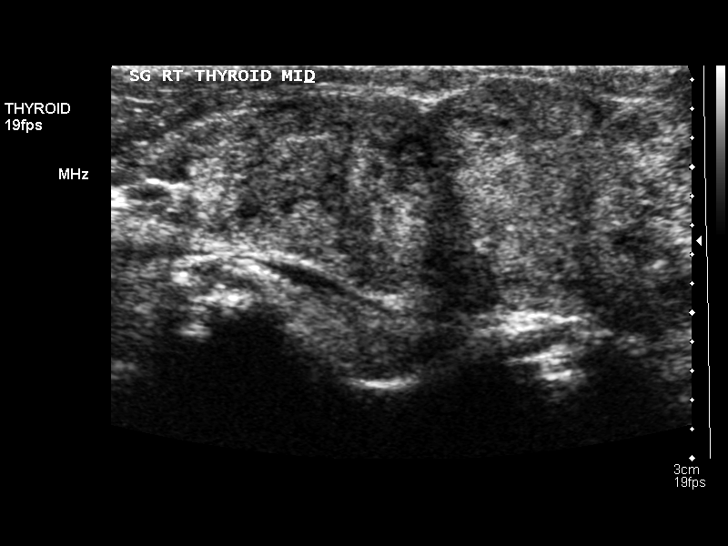
[im 21/46]
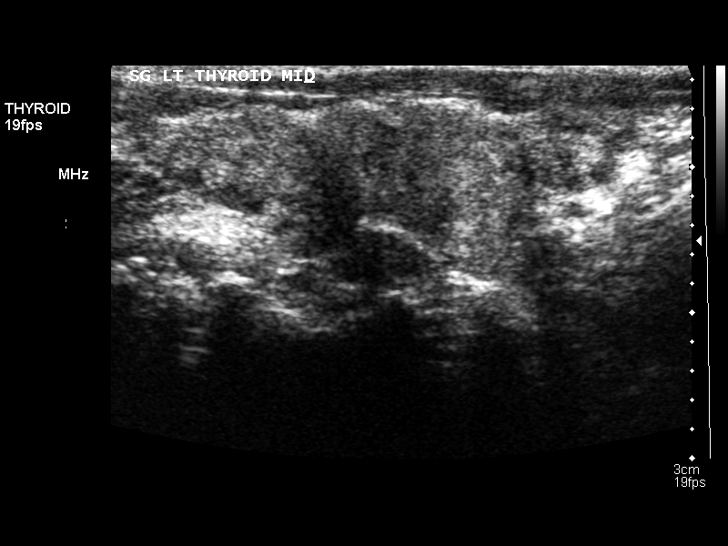
[im 25/46]
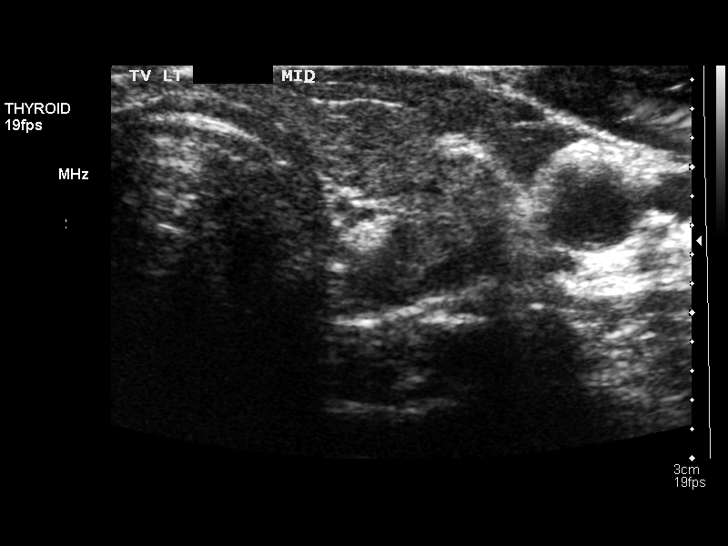
[im 29/46]
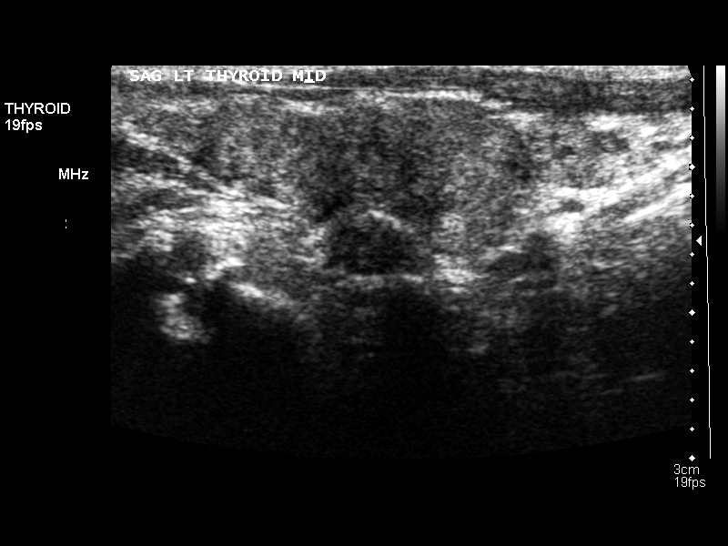
[im 31/46]
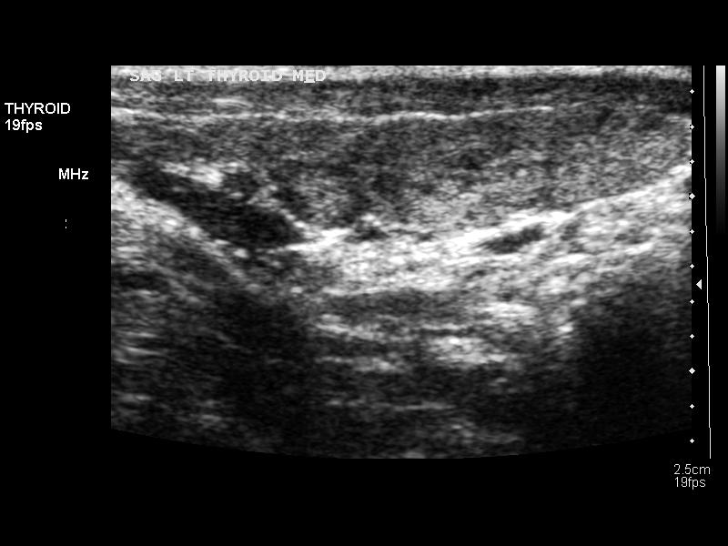
[im 34/46]
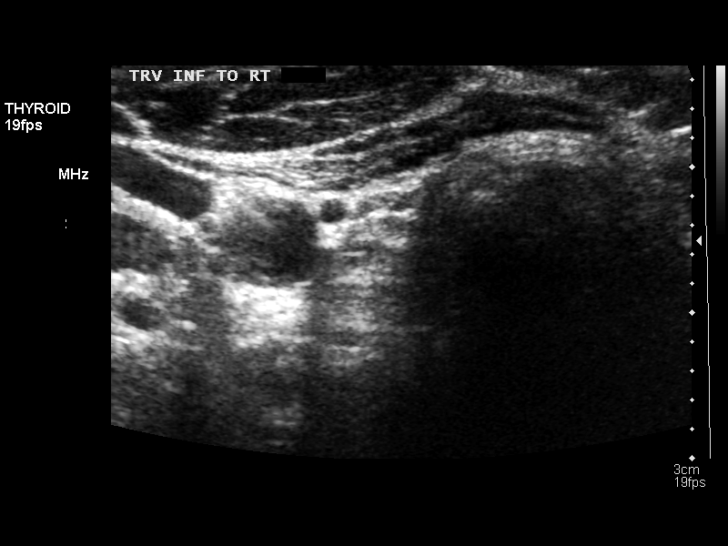
[im 38/46]
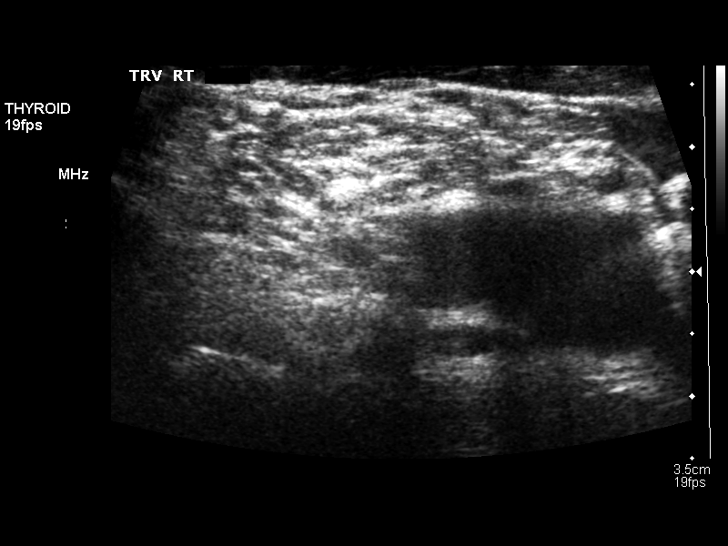
[im 42/46]
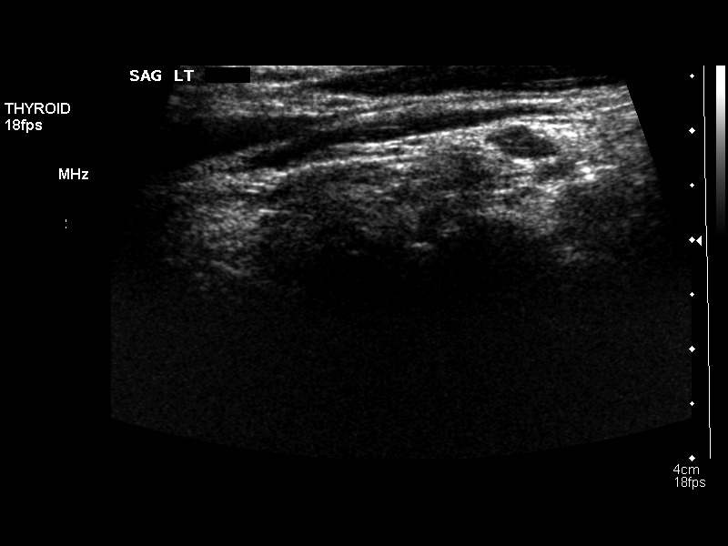
[im 46/46]
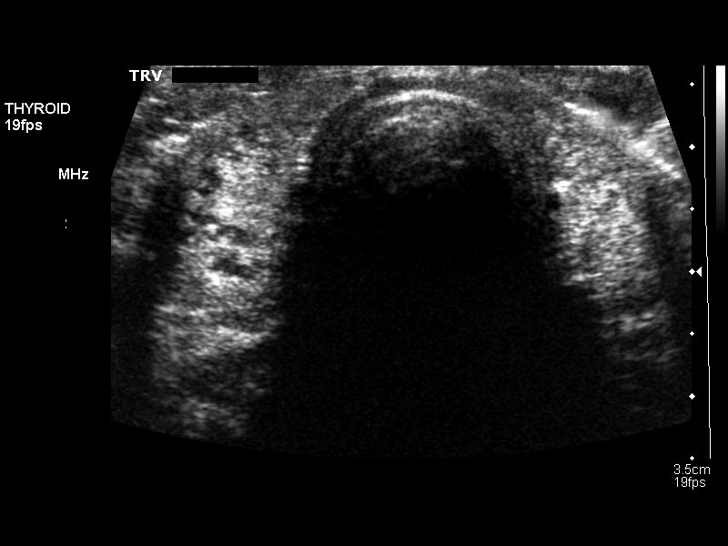

[14 of 25 positions shown; findings below may reference images not displayed]

FINDINGS: Right thyroid lobe

Measurements: 5.1 x 1.7 x 2.2 cm. Heterogeneous without focal
nodule.

Left thyroid lobe

Measurements: 3.4 x 1.2 x 1.3 cm. Heterogeneous tissue. 6 mm solid
nodule in the interpolar region adjacent to the isthmus.

Isthmus

Thickness: 3 mm.  Heterogeneous tissue.

Lymphadenopathy

None visualized.
IMPRESSION: Heterogeneous gland.

6 mm solid nodule in the left lobe.

Findings do not meet current SRU consensus criteria for biopsy.
Follow-up by clinical exam is recommended. If patient has known risk
factors for thyroid carcinoma, consider follow-up ultrasound in 12
months. If patient is clinically hyperthyroid, consider nuclear
medicine thyroid uptake and scan.Reference: Management of Thyroid
Nodules Detected at US: Society of Radiologists in Ultrasound

## 2015-10-17 DIAGNOSIS — E785 Hyperlipidemia, unspecified: Secondary | ICD-10-CM | POA: Diagnosis not present

## 2015-10-17 DIAGNOSIS — F339 Major depressive disorder, recurrent, unspecified: Secondary | ICD-10-CM | POA: Diagnosis not present

## 2015-10-17 DIAGNOSIS — I10 Essential (primary) hypertension: Secondary | ICD-10-CM | POA: Diagnosis not present

## 2015-10-17 DIAGNOSIS — F419 Anxiety disorder, unspecified: Secondary | ICD-10-CM | POA: Diagnosis not present

## 2015-10-17 DIAGNOSIS — K219 Gastro-esophageal reflux disease without esophagitis: Secondary | ICD-10-CM | POA: Diagnosis not present

## 2015-10-17 DIAGNOSIS — R05 Cough: Secondary | ICD-10-CM | POA: Diagnosis not present

## 2015-12-06 DIAGNOSIS — R51 Headache: Secondary | ICD-10-CM | POA: Diagnosis not present

## 2015-12-06 DIAGNOSIS — I10 Essential (primary) hypertension: Secondary | ICD-10-CM | POA: Diagnosis not present

## 2016-01-05 DIAGNOSIS — M791 Myalgia: Secondary | ICD-10-CM | POA: Diagnosis not present

## 2016-02-20 DIAGNOSIS — F419 Anxiety disorder, unspecified: Secondary | ICD-10-CM | POA: Diagnosis not present

## 2016-02-20 DIAGNOSIS — J309 Allergic rhinitis, unspecified: Secondary | ICD-10-CM | POA: Diagnosis not present

## 2016-02-20 DIAGNOSIS — I10 Essential (primary) hypertension: Secondary | ICD-10-CM | POA: Diagnosis not present

## 2016-02-20 DIAGNOSIS — E559 Vitamin D deficiency, unspecified: Secondary | ICD-10-CM | POA: Diagnosis not present

## 2016-02-20 DIAGNOSIS — E785 Hyperlipidemia, unspecified: Secondary | ICD-10-CM | POA: Diagnosis not present

## 2016-04-08 DIAGNOSIS — E039 Hypothyroidism, unspecified: Secondary | ICD-10-CM | POA: Diagnosis not present

## 2016-04-08 DIAGNOSIS — E063 Autoimmune thyroiditis: Secondary | ICD-10-CM | POA: Diagnosis not present

## 2016-05-04 DIAGNOSIS — S9031XA Contusion of right foot, initial encounter: Secondary | ICD-10-CM | POA: Diagnosis not present

## 2016-05-04 DIAGNOSIS — M25561 Pain in right knee: Secondary | ICD-10-CM | POA: Diagnosis not present

## 2016-05-04 DIAGNOSIS — S8011XA Contusion of right lower leg, initial encounter: Secondary | ICD-10-CM | POA: Diagnosis not present

## 2016-05-04 DIAGNOSIS — W19XXXA Unspecified fall, initial encounter: Secondary | ICD-10-CM | POA: Diagnosis not present

## 2016-05-04 DIAGNOSIS — M79671 Pain in right foot: Secondary | ICD-10-CM | POA: Diagnosis not present

## 2016-05-06 DIAGNOSIS — M25562 Pain in left knee: Secondary | ICD-10-CM | POA: Diagnosis not present

## 2016-05-20 DIAGNOSIS — E063 Autoimmune thyroiditis: Secondary | ICD-10-CM | POA: Diagnosis not present

## 2016-05-27 DIAGNOSIS — M25562 Pain in left knee: Secondary | ICD-10-CM | POA: Diagnosis not present

## 2016-05-28 DIAGNOSIS — M25662 Stiffness of left knee, not elsewhere classified: Secondary | ICD-10-CM | POA: Diagnosis not present

## 2016-05-28 DIAGNOSIS — M25562 Pain in left knee: Secondary | ICD-10-CM | POA: Diagnosis not present

## 2016-05-30 DIAGNOSIS — M25662 Stiffness of left knee, not elsewhere classified: Secondary | ICD-10-CM | POA: Diagnosis not present

## 2016-05-30 DIAGNOSIS — M25562 Pain in left knee: Secondary | ICD-10-CM | POA: Diagnosis not present

## 2016-06-04 DIAGNOSIS — M25662 Stiffness of left knee, not elsewhere classified: Secondary | ICD-10-CM | POA: Diagnosis not present

## 2016-06-04 DIAGNOSIS — M25562 Pain in left knee: Secondary | ICD-10-CM | POA: Diagnosis not present

## 2016-06-05 DIAGNOSIS — H26493 Other secondary cataract, bilateral: Secondary | ICD-10-CM | POA: Diagnosis not present

## 2016-06-05 DIAGNOSIS — Z961 Presence of intraocular lens: Secondary | ICD-10-CM | POA: Diagnosis not present

## 2016-06-05 DIAGNOSIS — D3131 Benign neoplasm of right choroid: Secondary | ICD-10-CM | POA: Diagnosis not present

## 2016-06-05 DIAGNOSIS — H43813 Vitreous degeneration, bilateral: Secondary | ICD-10-CM | POA: Diagnosis not present

## 2016-06-05 IMAGING — CR DG CHEST 2V
2 series · 2 of 2 positions shown · non-contrast
Comparison: November 02, 2014

CLINICAL DATA: Preoperative evaluation for total knee arthroplasty.
Hypertension

EXAM:
CHEST  2 VIEW

[w chest pa]
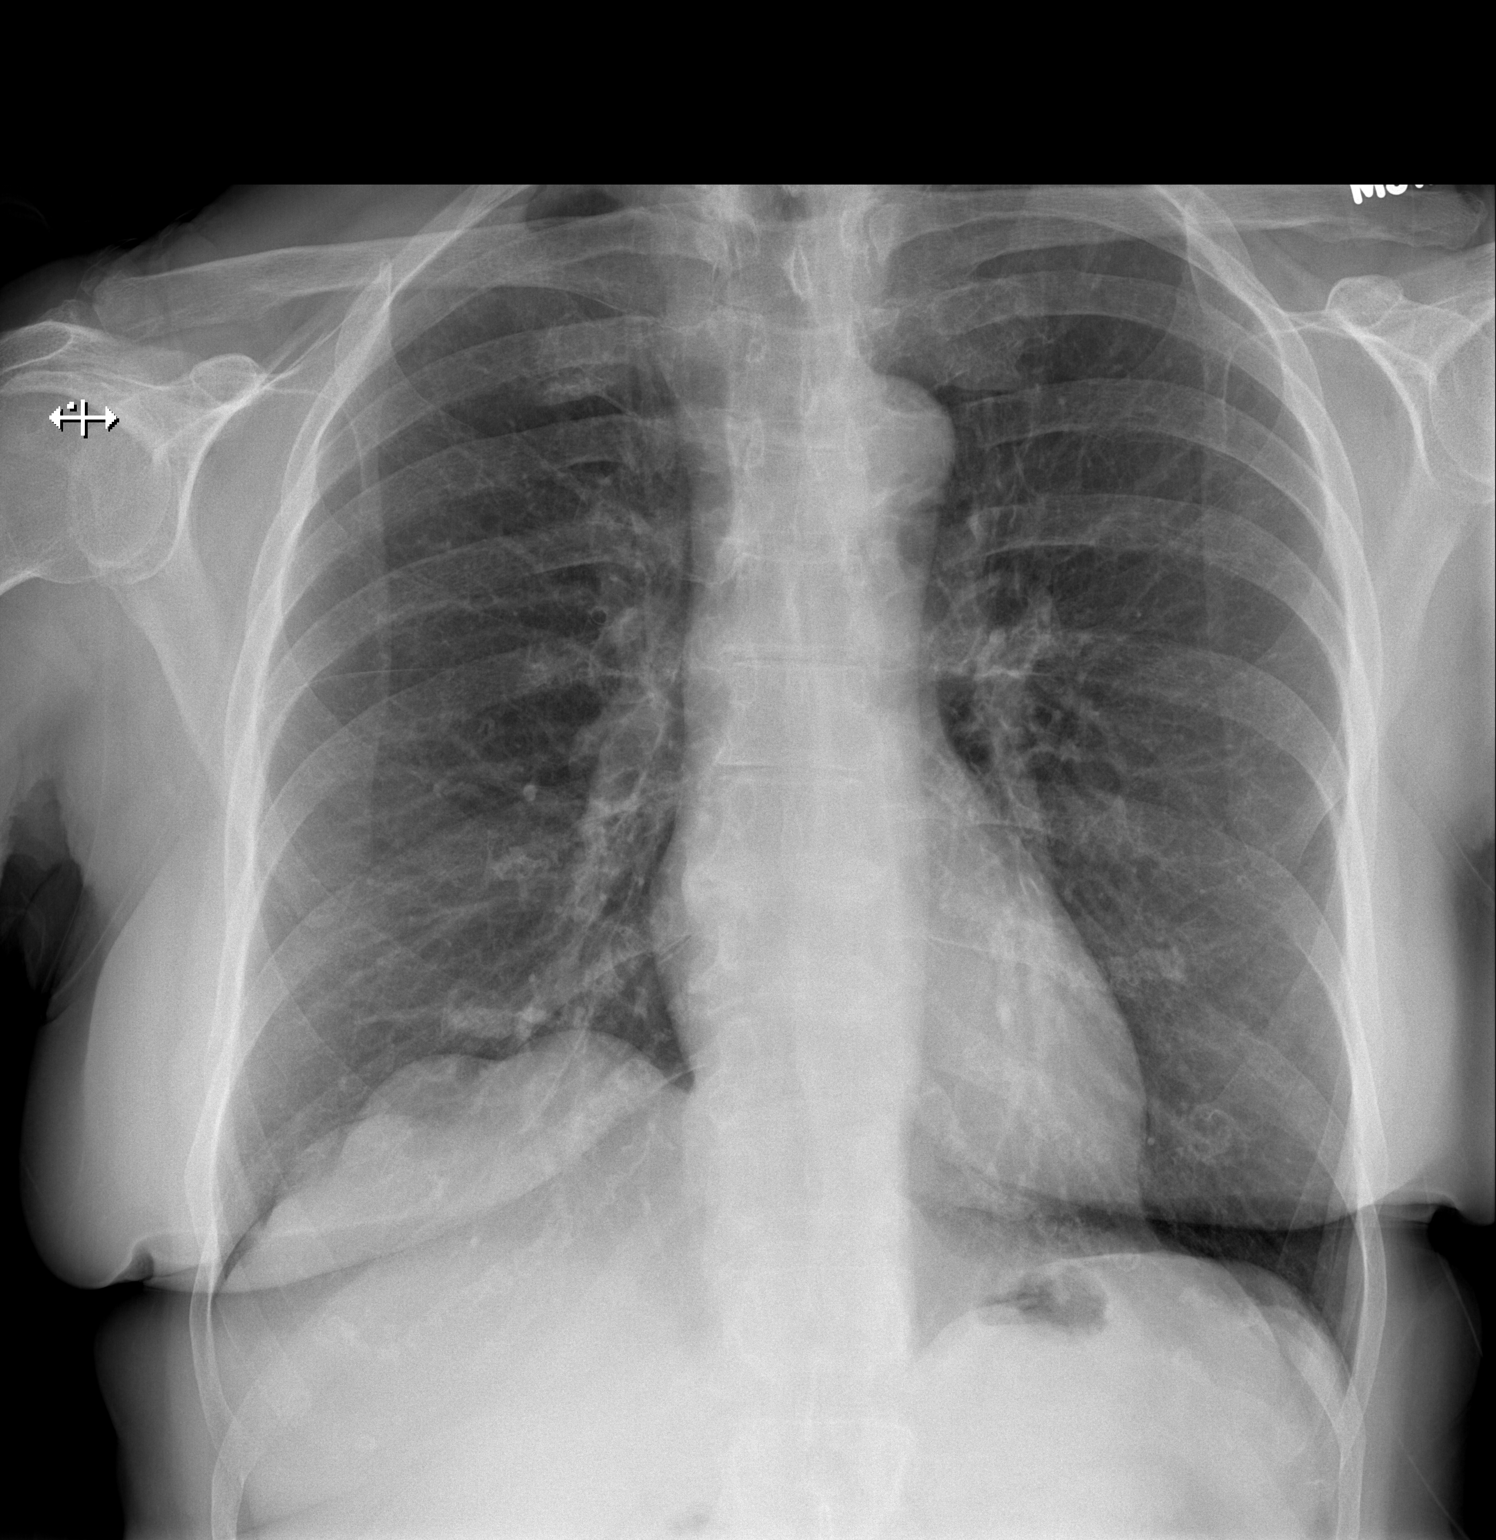

[w chest lat]
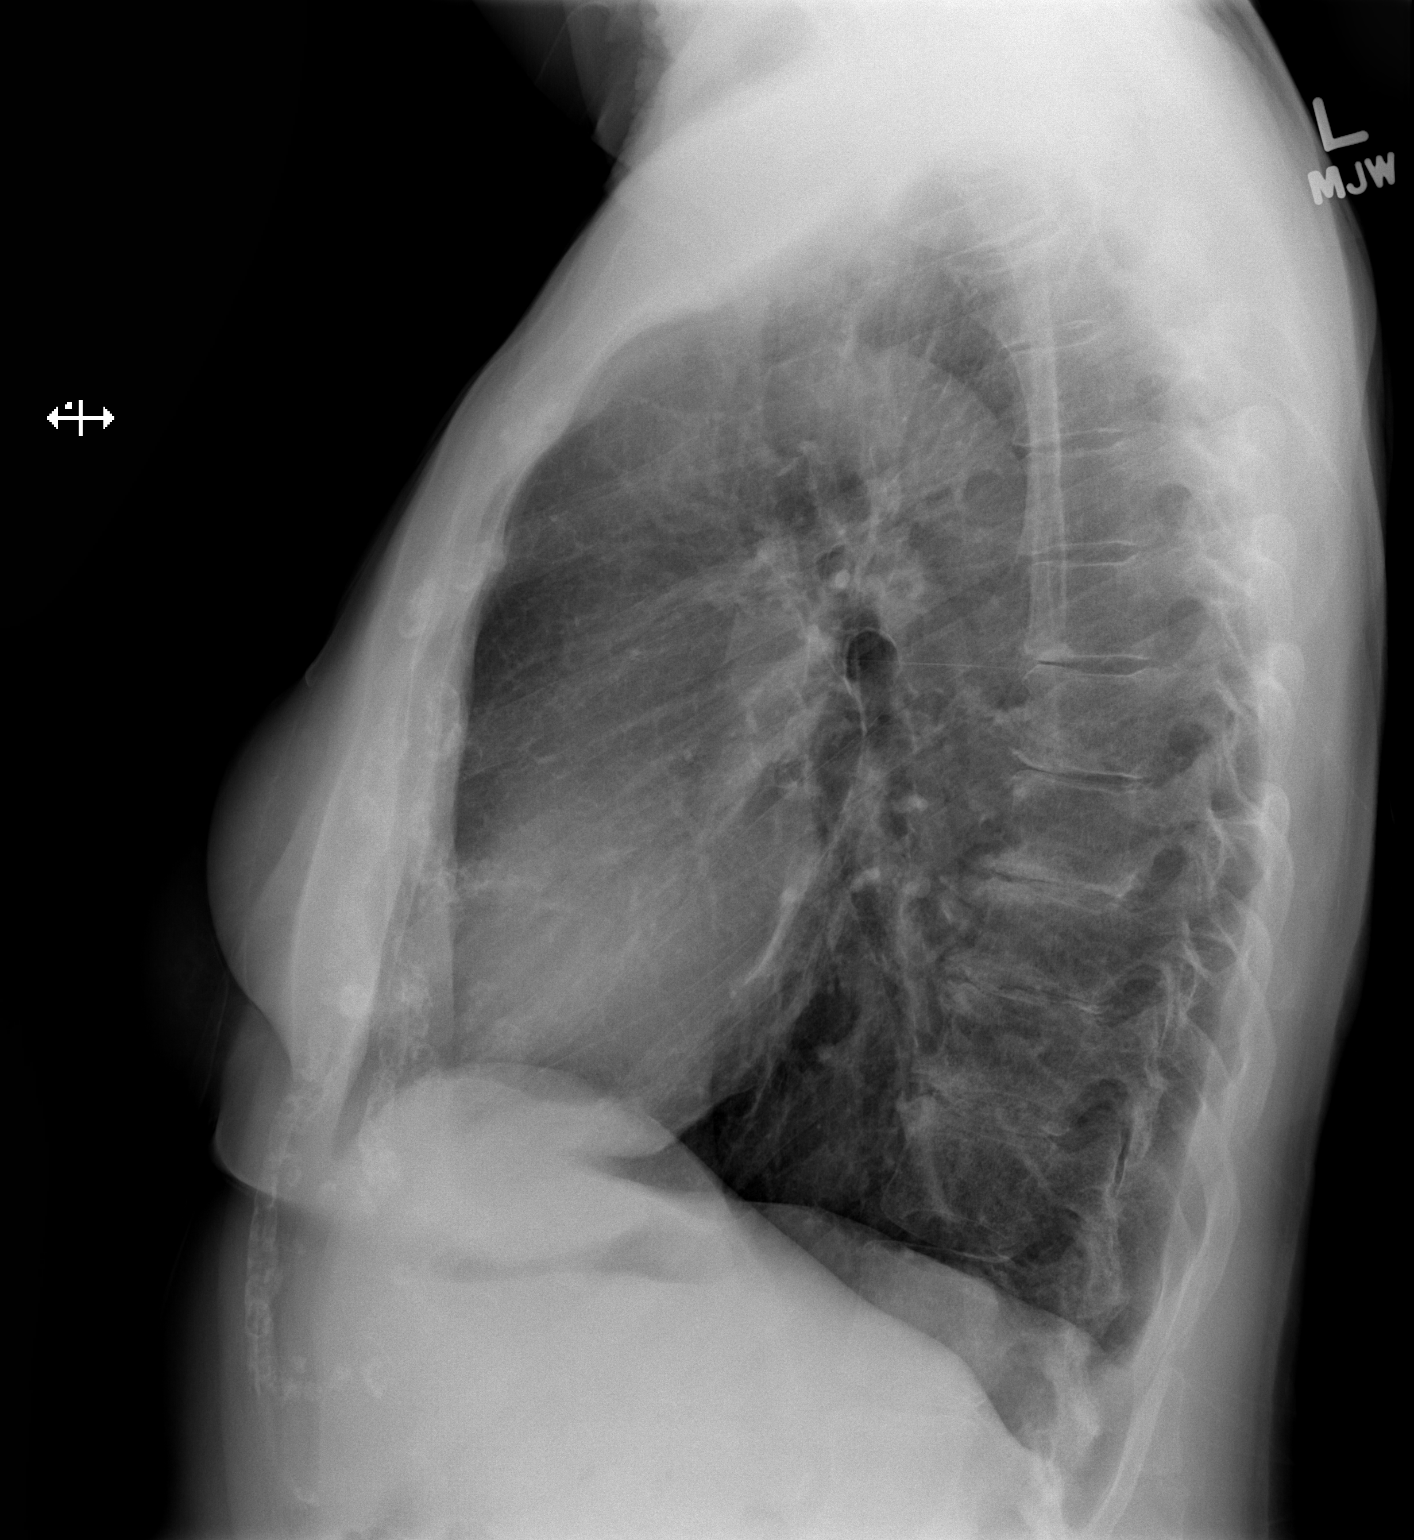

[2 of 2 positions shown; findings below may reference images not displayed]

FINDINGS: There is no edema or consolidation. Heart size and pulmonary
vascularity are normal. No adenopathy. There is degenerative change
in the thoracic spine.
IMPRESSION: No edema or consolidation.

## 2016-06-07 DIAGNOSIS — M25662 Stiffness of left knee, not elsewhere classified: Secondary | ICD-10-CM | POA: Diagnosis not present

## 2016-06-07 DIAGNOSIS — M25562 Pain in left knee: Secondary | ICD-10-CM | POA: Diagnosis not present

## 2016-06-11 DIAGNOSIS — M25562 Pain in left knee: Secondary | ICD-10-CM | POA: Diagnosis not present

## 2016-06-11 DIAGNOSIS — M25662 Stiffness of left knee, not elsewhere classified: Secondary | ICD-10-CM | POA: Diagnosis not present

## 2016-06-12 DIAGNOSIS — H26491 Other secondary cataract, right eye: Secondary | ICD-10-CM | POA: Diagnosis not present

## 2016-06-13 DIAGNOSIS — M25662 Stiffness of left knee, not elsewhere classified: Secondary | ICD-10-CM | POA: Diagnosis not present

## 2016-06-13 DIAGNOSIS — M25562 Pain in left knee: Secondary | ICD-10-CM | POA: Diagnosis not present

## 2016-06-20 DIAGNOSIS — M25562 Pain in left knee: Secondary | ICD-10-CM | POA: Diagnosis not present

## 2016-06-20 DIAGNOSIS — M25662 Stiffness of left knee, not elsewhere classified: Secondary | ICD-10-CM | POA: Diagnosis not present

## 2016-06-25 DIAGNOSIS — M25562 Pain in left knee: Secondary | ICD-10-CM | POA: Diagnosis not present

## 2016-06-25 DIAGNOSIS — M25662 Stiffness of left knee, not elsewhere classified: Secondary | ICD-10-CM | POA: Diagnosis not present

## 2016-07-08 DIAGNOSIS — M25552 Pain in left hip: Secondary | ICD-10-CM | POA: Diagnosis not present

## 2016-07-08 DIAGNOSIS — M5441 Lumbago with sciatica, right side: Secondary | ICD-10-CM | POA: Diagnosis not present

## 2016-07-17 DIAGNOSIS — M79605 Pain in left leg: Secondary | ICD-10-CM | POA: Diagnosis not present

## 2016-07-23 DIAGNOSIS — M79605 Pain in left leg: Secondary | ICD-10-CM | POA: Diagnosis not present

## 2016-07-23 DIAGNOSIS — F339 Major depressive disorder, recurrent, unspecified: Secondary | ICD-10-CM | POA: Diagnosis not present

## 2016-07-29 DIAGNOSIS — M25552 Pain in left hip: Secondary | ICD-10-CM | POA: Diagnosis not present

## 2016-08-06 DIAGNOSIS — S76912D Strain of unspecified muscles, fascia and tendons at thigh level, left thigh, subsequent encounter: Secondary | ICD-10-CM | POA: Diagnosis not present

## 2016-08-06 DIAGNOSIS — M7062 Trochanteric bursitis, left hip: Secondary | ICD-10-CM | POA: Diagnosis not present

## 2016-08-06 DIAGNOSIS — M25552 Pain in left hip: Secondary | ICD-10-CM | POA: Diagnosis not present

## 2016-08-07 DIAGNOSIS — M25552 Pain in left hip: Secondary | ICD-10-CM | POA: Diagnosis not present

## 2016-08-07 DIAGNOSIS — M7062 Trochanteric bursitis, left hip: Secondary | ICD-10-CM | POA: Diagnosis not present

## 2016-08-07 DIAGNOSIS — S76912D Strain of unspecified muscles, fascia and tendons at thigh level, left thigh, subsequent encounter: Secondary | ICD-10-CM | POA: Diagnosis not present

## 2016-08-09 DIAGNOSIS — F339 Major depressive disorder, recurrent, unspecified: Secondary | ICD-10-CM | POA: Diagnosis not present

## 2016-08-09 DIAGNOSIS — I10 Essential (primary) hypertension: Secondary | ICD-10-CM | POA: Diagnosis not present

## 2016-08-09 DIAGNOSIS — E785 Hyperlipidemia, unspecified: Secondary | ICD-10-CM | POA: Diagnosis not present

## 2016-08-13 DIAGNOSIS — M7062 Trochanteric bursitis, left hip: Secondary | ICD-10-CM | POA: Diagnosis not present

## 2016-08-13 DIAGNOSIS — S76912D Strain of unspecified muscles, fascia and tendons at thigh level, left thigh, subsequent encounter: Secondary | ICD-10-CM | POA: Diagnosis not present

## 2016-08-13 DIAGNOSIS — M25552 Pain in left hip: Secondary | ICD-10-CM | POA: Diagnosis not present

## 2016-08-15 DIAGNOSIS — M25552 Pain in left hip: Secondary | ICD-10-CM | POA: Diagnosis not present

## 2016-08-15 DIAGNOSIS — S76912D Strain of unspecified muscles, fascia and tendons at thigh level, left thigh, subsequent encounter: Secondary | ICD-10-CM | POA: Diagnosis not present

## 2016-08-15 DIAGNOSIS — M7062 Trochanteric bursitis, left hip: Secondary | ICD-10-CM | POA: Diagnosis not present

## 2016-08-19 DIAGNOSIS — S76912D Strain of unspecified muscles, fascia and tendons at thigh level, left thigh, subsequent encounter: Secondary | ICD-10-CM | POA: Diagnosis not present

## 2016-08-19 DIAGNOSIS — M25552 Pain in left hip: Secondary | ICD-10-CM | POA: Diagnosis not present

## 2016-08-19 DIAGNOSIS — M7062 Trochanteric bursitis, left hip: Secondary | ICD-10-CM | POA: Diagnosis not present

## 2016-08-21 DIAGNOSIS — S76912D Strain of unspecified muscles, fascia and tendons at thigh level, left thigh, subsequent encounter: Secondary | ICD-10-CM | POA: Diagnosis not present

## 2016-08-21 DIAGNOSIS — M25552 Pain in left hip: Secondary | ICD-10-CM | POA: Diagnosis not present

## 2016-08-21 DIAGNOSIS — M7062 Trochanteric bursitis, left hip: Secondary | ICD-10-CM | POA: Diagnosis not present

## 2016-08-27 DIAGNOSIS — S76912D Strain of unspecified muscles, fascia and tendons at thigh level, left thigh, subsequent encounter: Secondary | ICD-10-CM | POA: Diagnosis not present

## 2016-08-27 DIAGNOSIS — M7062 Trochanteric bursitis, left hip: Secondary | ICD-10-CM | POA: Diagnosis not present

## 2016-08-27 DIAGNOSIS — M25552 Pain in left hip: Secondary | ICD-10-CM | POA: Diagnosis not present

## 2016-08-29 DIAGNOSIS — M7062 Trochanteric bursitis, left hip: Secondary | ICD-10-CM | POA: Diagnosis not present

## 2016-08-29 DIAGNOSIS — M25552 Pain in left hip: Secondary | ICD-10-CM | POA: Diagnosis not present

## 2016-08-29 DIAGNOSIS — S76912D Strain of unspecified muscles, fascia and tendons at thigh level, left thigh, subsequent encounter: Secondary | ICD-10-CM | POA: Diagnosis not present

## 2016-09-24 DIAGNOSIS — M9905 Segmental and somatic dysfunction of pelvic region: Secondary | ICD-10-CM | POA: Diagnosis not present

## 2016-09-24 DIAGNOSIS — M9903 Segmental and somatic dysfunction of lumbar region: Secondary | ICD-10-CM | POA: Diagnosis not present

## 2016-09-24 DIAGNOSIS — M5432 Sciatica, left side: Secondary | ICD-10-CM | POA: Diagnosis not present

## 2016-09-24 DIAGNOSIS — M9904 Segmental and somatic dysfunction of sacral region: Secondary | ICD-10-CM | POA: Diagnosis not present

## 2016-09-30 DIAGNOSIS — M9903 Segmental and somatic dysfunction of lumbar region: Secondary | ICD-10-CM | POA: Diagnosis not present

## 2016-09-30 DIAGNOSIS — M9904 Segmental and somatic dysfunction of sacral region: Secondary | ICD-10-CM | POA: Diagnosis not present

## 2016-09-30 DIAGNOSIS — M9905 Segmental and somatic dysfunction of pelvic region: Secondary | ICD-10-CM | POA: Diagnosis not present

## 2016-09-30 DIAGNOSIS — M5432 Sciatica, left side: Secondary | ICD-10-CM | POA: Diagnosis not present

## 2016-10-03 DIAGNOSIS — M9905 Segmental and somatic dysfunction of pelvic region: Secondary | ICD-10-CM | POA: Diagnosis not present

## 2016-10-03 DIAGNOSIS — M5432 Sciatica, left side: Secondary | ICD-10-CM | POA: Diagnosis not present

## 2016-10-03 DIAGNOSIS — M9904 Segmental and somatic dysfunction of sacral region: Secondary | ICD-10-CM | POA: Diagnosis not present

## 2016-10-03 DIAGNOSIS — M9903 Segmental and somatic dysfunction of lumbar region: Secondary | ICD-10-CM | POA: Diagnosis not present

## 2016-10-10 DIAGNOSIS — M5432 Sciatica, left side: Secondary | ICD-10-CM | POA: Diagnosis not present

## 2016-10-10 DIAGNOSIS — M9903 Segmental and somatic dysfunction of lumbar region: Secondary | ICD-10-CM | POA: Diagnosis not present

## 2016-10-10 DIAGNOSIS — M9905 Segmental and somatic dysfunction of pelvic region: Secondary | ICD-10-CM | POA: Diagnosis not present

## 2016-10-10 DIAGNOSIS — M9904 Segmental and somatic dysfunction of sacral region: Secondary | ICD-10-CM | POA: Diagnosis not present

## 2016-10-24 DIAGNOSIS — M5432 Sciatica, left side: Secondary | ICD-10-CM | POA: Diagnosis not present

## 2016-10-24 DIAGNOSIS — M9903 Segmental and somatic dysfunction of lumbar region: Secondary | ICD-10-CM | POA: Diagnosis not present

## 2016-10-24 DIAGNOSIS — M9905 Segmental and somatic dysfunction of pelvic region: Secondary | ICD-10-CM | POA: Diagnosis not present

## 2016-10-24 DIAGNOSIS — M9904 Segmental and somatic dysfunction of sacral region: Secondary | ICD-10-CM | POA: Diagnosis not present

## 2016-11-07 DIAGNOSIS — M9905 Segmental and somatic dysfunction of pelvic region: Secondary | ICD-10-CM | POA: Diagnosis not present

## 2016-11-07 DIAGNOSIS — M5432 Sciatica, left side: Secondary | ICD-10-CM | POA: Diagnosis not present

## 2016-11-07 DIAGNOSIS — M9904 Segmental and somatic dysfunction of sacral region: Secondary | ICD-10-CM | POA: Diagnosis not present

## 2016-11-07 DIAGNOSIS — M9903 Segmental and somatic dysfunction of lumbar region: Secondary | ICD-10-CM | POA: Diagnosis not present

## 2016-11-28 DIAGNOSIS — M9904 Segmental and somatic dysfunction of sacral region: Secondary | ICD-10-CM | POA: Diagnosis not present

## 2016-11-28 DIAGNOSIS — M5432 Sciatica, left side: Secondary | ICD-10-CM | POA: Diagnosis not present

## 2016-11-28 DIAGNOSIS — M9905 Segmental and somatic dysfunction of pelvic region: Secondary | ICD-10-CM | POA: Diagnosis not present

## 2016-11-28 DIAGNOSIS — M9903 Segmental and somatic dysfunction of lumbar region: Secondary | ICD-10-CM | POA: Diagnosis not present

## 2017-02-17 DIAGNOSIS — I10 Essential (primary) hypertension: Secondary | ICD-10-CM | POA: Diagnosis not present

## 2017-02-17 DIAGNOSIS — E785 Hyperlipidemia, unspecified: Secondary | ICD-10-CM | POA: Diagnosis not present

## 2017-02-17 DIAGNOSIS — F419 Anxiety disorder, unspecified: Secondary | ICD-10-CM | POA: Diagnosis not present

## 2017-02-17 DIAGNOSIS — R296 Repeated falls: Secondary | ICD-10-CM | POA: Diagnosis not present

## 2017-02-17 DIAGNOSIS — F339 Major depressive disorder, recurrent, unspecified: Secondary | ICD-10-CM | POA: Diagnosis not present

## 2017-02-17 DIAGNOSIS — Z0000 Encounter for general adult medical examination without abnormal findings: Secondary | ICD-10-CM | POA: Diagnosis not present

## 2017-02-17 DIAGNOSIS — E039 Hypothyroidism, unspecified: Secondary | ICD-10-CM | POA: Diagnosis not present

## 2017-02-24 DIAGNOSIS — Z1231 Encounter for screening mammogram for malignant neoplasm of breast: Secondary | ICD-10-CM | POA: Diagnosis not present

## 2017-05-29 ENCOUNTER — Encounter: Payer: Self-pay | Admitting: Family Medicine

## 2017-07-14 DIAGNOSIS — M25552 Pain in left hip: Secondary | ICD-10-CM | POA: Diagnosis not present

## 2017-08-26 DIAGNOSIS — M7062 Trochanteric bursitis, left hip: Secondary | ICD-10-CM | POA: Diagnosis not present

## 2017-08-26 DIAGNOSIS — I10 Essential (primary) hypertension: Secondary | ICD-10-CM | POA: Diagnosis not present

## 2017-08-26 DIAGNOSIS — F419 Anxiety disorder, unspecified: Secondary | ICD-10-CM | POA: Diagnosis not present

## 2017-08-26 DIAGNOSIS — E785 Hyperlipidemia, unspecified: Secondary | ICD-10-CM | POA: Diagnosis not present

## 2017-08-26 DIAGNOSIS — E559 Vitamin D deficiency, unspecified: Secondary | ICD-10-CM | POA: Diagnosis not present

## 2017-08-26 DIAGNOSIS — F339 Major depressive disorder, recurrent, unspecified: Secondary | ICD-10-CM | POA: Diagnosis not present

## 2017-08-26 DIAGNOSIS — E039 Hypothyroidism, unspecified: Secondary | ICD-10-CM | POA: Diagnosis not present

## 2017-09-18 DIAGNOSIS — E039 Hypothyroidism, unspecified: Secondary | ICD-10-CM | POA: Diagnosis not present

## 2017-09-18 DIAGNOSIS — E063 Autoimmune thyroiditis: Secondary | ICD-10-CM | POA: Diagnosis not present

## 2017-10-29 DIAGNOSIS — H43813 Vitreous degeneration, bilateral: Secondary | ICD-10-CM | POA: Diagnosis not present

## 2017-10-29 DIAGNOSIS — H04123 Dry eye syndrome of bilateral lacrimal glands: Secondary | ICD-10-CM | POA: Diagnosis not present

## 2017-10-29 DIAGNOSIS — H26493 Other secondary cataract, bilateral: Secondary | ICD-10-CM | POA: Diagnosis not present

## 2017-10-29 DIAGNOSIS — R51 Headache: Secondary | ICD-10-CM | POA: Diagnosis not present

## 2017-11-26 DIAGNOSIS — E785 Hyperlipidemia, unspecified: Secondary | ICD-10-CM | POA: Diagnosis not present

## 2017-11-26 DIAGNOSIS — E039 Hypothyroidism, unspecified: Secondary | ICD-10-CM | POA: Diagnosis not present

## 2018-03-02 DIAGNOSIS — F339 Major depressive disorder, recurrent, unspecified: Secondary | ICD-10-CM | POA: Diagnosis not present

## 2018-03-02 DIAGNOSIS — Z0000 Encounter for general adult medical examination without abnormal findings: Secondary | ICD-10-CM | POA: Diagnosis not present

## 2018-03-02 DIAGNOSIS — E785 Hyperlipidemia, unspecified: Secondary | ICD-10-CM | POA: Diagnosis not present

## 2018-03-02 DIAGNOSIS — I10 Essential (primary) hypertension: Secondary | ICD-10-CM | POA: Diagnosis not present

## 2018-03-02 DIAGNOSIS — F419 Anxiety disorder, unspecified: Secondary | ICD-10-CM | POA: Diagnosis not present

## 2018-03-02 DIAGNOSIS — E559 Vitamin D deficiency, unspecified: Secondary | ICD-10-CM | POA: Diagnosis not present

## 2018-03-02 DIAGNOSIS — I739 Peripheral vascular disease, unspecified: Secondary | ICD-10-CM | POA: Diagnosis not present

## 2018-03-02 DIAGNOSIS — Z808 Family history of malignant neoplasm of other organs or systems: Secondary | ICD-10-CM | POA: Diagnosis not present

## 2018-03-02 DIAGNOSIS — E039 Hypothyroidism, unspecified: Secondary | ICD-10-CM | POA: Diagnosis not present

## 2018-03-13 DIAGNOSIS — H11812 Pseudopterygium of conjunctiva, left eye: Secondary | ICD-10-CM | POA: Diagnosis not present

## 2018-03-31 DIAGNOSIS — H16143 Punctate keratitis, bilateral: Secondary | ICD-10-CM | POA: Diagnosis not present

## 2018-03-31 DIAGNOSIS — H11153 Pinguecula, bilateral: Secondary | ICD-10-CM | POA: Diagnosis not present

## 2018-03-31 DIAGNOSIS — H02051 Trichiasis without entropian right upper eyelid: Secondary | ICD-10-CM | POA: Diagnosis not present

## 2018-03-31 DIAGNOSIS — Z961 Presence of intraocular lens: Secondary | ICD-10-CM | POA: Diagnosis not present

## 2018-03-31 DIAGNOSIS — H04123 Dry eye syndrome of bilateral lacrimal glands: Secondary | ICD-10-CM | POA: Diagnosis not present

## 2018-03-31 DIAGNOSIS — H11122 Conjunctival concretions, left eye: Secondary | ICD-10-CM | POA: Diagnosis not present

## 2018-05-26 DIAGNOSIS — L601 Onycholysis: Secondary | ICD-10-CM | POA: Diagnosis not present

## 2018-05-26 DIAGNOSIS — L821 Other seborrheic keratosis: Secondary | ICD-10-CM | POA: Diagnosis not present

## 2018-05-26 DIAGNOSIS — D1801 Hemangioma of skin and subcutaneous tissue: Secondary | ICD-10-CM | POA: Diagnosis not present

## 2018-05-26 DIAGNOSIS — D225 Melanocytic nevi of trunk: Secondary | ICD-10-CM | POA: Diagnosis not present

## 2018-05-26 DIAGNOSIS — L814 Other melanin hyperpigmentation: Secondary | ICD-10-CM | POA: Diagnosis not present

## 2018-06-01 DIAGNOSIS — J069 Acute upper respiratory infection, unspecified: Secondary | ICD-10-CM | POA: Diagnosis not present

## 2018-06-01 DIAGNOSIS — R05 Cough: Secondary | ICD-10-CM | POA: Diagnosis not present

## 2018-06-17 DIAGNOSIS — H43811 Vitreous degeneration, right eye: Secondary | ICD-10-CM | POA: Diagnosis not present

## 2018-06-17 DIAGNOSIS — H11441 Conjunctival cysts, right eye: Secondary | ICD-10-CM | POA: Diagnosis not present

## 2018-06-17 DIAGNOSIS — D3131 Benign neoplasm of right choroid: Secondary | ICD-10-CM | POA: Diagnosis not present

## 2018-06-17 DIAGNOSIS — Z961 Presence of intraocular lens: Secondary | ICD-10-CM | POA: Diagnosis not present

## 2018-08-14 DIAGNOSIS — M542 Cervicalgia: Secondary | ICD-10-CM | POA: Diagnosis not present

## 2018-08-14 DIAGNOSIS — M25511 Pain in right shoulder: Secondary | ICD-10-CM | POA: Diagnosis not present

## 2018-08-14 DIAGNOSIS — M545 Low back pain: Secondary | ICD-10-CM | POA: Diagnosis not present

## 2018-08-14 DIAGNOSIS — M79641 Pain in right hand: Secondary | ICD-10-CM | POA: Diagnosis not present

## 2018-08-31 DIAGNOSIS — Z23 Encounter for immunization: Secondary | ICD-10-CM | POA: Diagnosis not present

## 2018-09-01 DIAGNOSIS — E785 Hyperlipidemia, unspecified: Secondary | ICD-10-CM | POA: Diagnosis not present

## 2018-09-01 DIAGNOSIS — I10 Essential (primary) hypertension: Secondary | ICD-10-CM | POA: Diagnosis not present

## 2018-09-01 DIAGNOSIS — F419 Anxiety disorder, unspecified: Secondary | ICD-10-CM | POA: Diagnosis not present

## 2018-09-01 DIAGNOSIS — E039 Hypothyroidism, unspecified: Secondary | ICD-10-CM | POA: Diagnosis not present

## 2018-09-01 DIAGNOSIS — F339 Major depressive disorder, recurrent, unspecified: Secondary | ICD-10-CM | POA: Diagnosis not present

## 2018-09-14 DIAGNOSIS — R1012 Left upper quadrant pain: Secondary | ICD-10-CM | POA: Diagnosis not present

## 2018-09-17 DIAGNOSIS — L299 Pruritus, unspecified: Secondary | ICD-10-CM | POA: Diagnosis not present

## 2018-09-17 DIAGNOSIS — R1012 Left upper quadrant pain: Secondary | ICD-10-CM | POA: Diagnosis not present

## 2018-09-21 DIAGNOSIS — E063 Autoimmune thyroiditis: Secondary | ICD-10-CM | POA: Diagnosis not present

## 2018-09-21 DIAGNOSIS — E039 Hypothyroidism, unspecified: Secondary | ICD-10-CM | POA: Diagnosis not present

## 2018-10-29 DIAGNOSIS — R21 Rash and other nonspecific skin eruption: Secondary | ICD-10-CM | POA: Diagnosis not present

## 2019-02-16 DIAGNOSIS — R1012 Left upper quadrant pain: Secondary | ICD-10-CM | POA: Diagnosis not present

## 2019-02-16 DIAGNOSIS — E785 Hyperlipidemia, unspecified: Secondary | ICD-10-CM | POA: Diagnosis not present

## 2019-02-16 DIAGNOSIS — I10 Essential (primary) hypertension: Secondary | ICD-10-CM | POA: Diagnosis not present

## 2019-02-16 DIAGNOSIS — E039 Hypothyroidism, unspecified: Secondary | ICD-10-CM | POA: Diagnosis not present

## 2019-02-16 DIAGNOSIS — F419 Anxiety disorder, unspecified: Secondary | ICD-10-CM | POA: Diagnosis not present

## 2019-02-16 DIAGNOSIS — M62838 Other muscle spasm: Secondary | ICD-10-CM | POA: Diagnosis not present

## 2019-03-01 ENCOUNTER — Other Ambulatory Visit: Payer: Self-pay

## 2019-03-01 ENCOUNTER — Encounter: Payer: Self-pay | Admitting: Family Medicine

## 2019-03-01 ENCOUNTER — Ambulatory Visit (INDEPENDENT_AMBULATORY_CARE_PROVIDER_SITE_OTHER): Payer: Medicare Other | Admitting: Family Medicine

## 2019-03-01 VITALS — BP 120/80

## 2019-03-01 DIAGNOSIS — E039 Hypothyroidism, unspecified: Secondary | ICD-10-CM | POA: Diagnosis not present

## 2019-03-01 DIAGNOSIS — I10 Essential (primary) hypertension: Secondary | ICD-10-CM

## 2019-03-01 DIAGNOSIS — R0781 Pleurodynia: Secondary | ICD-10-CM

## 2019-03-01 DIAGNOSIS — K219 Gastro-esophageal reflux disease without esophagitis: Secondary | ICD-10-CM

## 2019-03-01 DIAGNOSIS — R1310 Dysphagia, unspecified: Secondary | ICD-10-CM | POA: Diagnosis not present

## 2019-03-01 MED ORDER — OMEPRAZOLE 40 MG PO CPDR
40.0000 mg | DELAYED_RELEASE_CAPSULE | Freq: Every day | ORAL | 3 refills | Status: DC
Start: 2019-03-01 — End: 2020-02-01

## 2019-03-01 NOTE — Progress Notes (Signed)
Virtual Visit via Telephone Note  I connected with Crystal Levy on 03/06/19 at  3:00 PM EDT by telephone and verified that I am speaking with the correct person using two identifiers.   I discussed the limitations, risks, security and privacy concerns of performing an evaluation and management service by telephone and the availability of in person appointments. I also discussed with the patient that there may be a patient responsible charge related to this service. The patient expressed understanding and agreed to proceed.  Location patient: home Location provider: work office Participants present for the call: patient, provider Patient did not have a visit in the prior 7 days to address this/these issue(s).   History of Present Illness: Crystal Levy is a 78 yo female with Hx of anxiety,back pain,OA,Vit D def,HTN,s/p polio with residual LLE weakness,and hypothyroidism among some establishing care today. I saw her a 4 years ago at [place]. Former PCP: Dr Olen Pel.  HTN, she is on Benazepril 10 mg. 15+ years hx. Hx Thought she had valvular disease, followed with Dr Lindie Spruce. Echo not able to confirm bicuspid valve and no AS. She is not longer following with cardiologist.  Checks BP occasionally and it is usually "good." Denies severe/frequent headache, visual changes, chest pain, dyspnea, palpitation, claudication, new focal weakness, or edema.  Hypothyroidism, she is on Levothyroxine 88 mcg daily. Negative for cold/heat intolerance, abnormal wt loss,or changes in bowel habits. She follows with endocrinologist,Dr Buddy Duty.  Today she is concerned about stabbing-like pain that extends from left rib cage to mid sternum  She already had imaging and blood work a month ago and per pt report normal. No Hx of trauma. Problem seems stable.  Pain is intermittently, she has not identified exacerbating factors. Pain seems to be better when she passes gas. She has history of abdominal bloating  sensation and "a lot of gas." Denies associated nausea, vomiting, changes in bowel habits, melena, or blood in the stool. History of constipation, stable.  9 years of intermittent episodes of dysphasia, worse with soft food and vegetables. Occasional heartburn. She is taking Tums as needed.  Anxiety and depression, currently she is on Celexa 20 mg daily and Lorazepam 0.5 mg daily as needed, the latter one helps with the sleep. She has been on medication for years. She lives alone. She denies suicidal thoughts.    Observations/Objective: Patient sounds cheerful and well on the phone. I do not appreciate any SOB. Speech and thought processing are grossly intact.+ Anxious. Patient reported vitals:BP 120/80    Assessment and Plan:  Dysphagia, unspecified type Avoid foods that can aggravate problem. Instructed about warning signs. Most likely related to GERD,other possible causes discussed. Omeprazole added today.  Costal margin pain GI related?Dyspepsia/gas. Reporting imaging and blood work normal. Monitor for new symptoms or worsening. F/U in 2 months.  Gastroesophageal reflux disease, esophagitis presence not specified GERD precautions. Omeprazole daily 30 min before breakfasts. F/U in 2 months,before if needed.  - omeprazole (PRILOSEC) 40 MG capsule; Take 1 capsule (40 mg total) by mouth daily.  Dispense: 30 capsule; Refill: 3  Hypothyroidism (acquired) Continue Levothyroxine 88 mcg daily. Following with endocrinologist.  HTN (hypertension) BP adequately controlled. No changes in current management. Continue low-salt diet. Continue monitoring BP regularly.   Follow Up Instructions:  2 months follow-up recommended. I did not refer this patient for an OV in the next 24 hours for this/these issue(s).  I discussed the assessment and treatment plan with the patient. She was provided an opportunity to  ask questions and all were answered. The patient agreed with the  plan and demonstrated an understanding of the instructions.   The patient was advised to call back or seek an in-person evaluation if the symptoms worsen or if the condition fails to improve as anticipated.  I provided 26 minutes of non-face-to-face time during this encounter.   Crystal Martinique, MD

## 2019-03-01 NOTE — Assessment & Plan Note (Signed)
BP adequately controlled. No changes in current management. Continue low-salt diet. Continue monitoring BP regularly.

## 2019-04-05 DIAGNOSIS — R1012 Left upper quadrant pain: Secondary | ICD-10-CM | POA: Diagnosis not present

## 2019-04-13 DIAGNOSIS — R1012 Left upper quadrant pain: Secondary | ICD-10-CM | POA: Diagnosis not present

## 2019-04-13 DIAGNOSIS — K449 Diaphragmatic hernia without obstruction or gangrene: Secondary | ICD-10-CM | POA: Diagnosis not present

## 2019-04-13 DIAGNOSIS — N281 Cyst of kidney, acquired: Secondary | ICD-10-CM | POA: Diagnosis not present

## 2019-04-16 DIAGNOSIS — R197 Diarrhea, unspecified: Secondary | ICD-10-CM | POA: Diagnosis not present

## 2019-04-16 DIAGNOSIS — R1310 Dysphagia, unspecified: Secondary | ICD-10-CM | POA: Diagnosis not present

## 2019-04-16 DIAGNOSIS — Z1211 Encounter for screening for malignant neoplasm of colon: Secondary | ICD-10-CM | POA: Diagnosis not present

## 2019-04-16 DIAGNOSIS — R1013 Epigastric pain: Secondary | ICD-10-CM | POA: Diagnosis not present

## 2019-04-26 DIAGNOSIS — Z23 Encounter for immunization: Secondary | ICD-10-CM | POA: Diagnosis not present

## 2019-05-12 DIAGNOSIS — F339 Major depressive disorder, recurrent, unspecified: Secondary | ICD-10-CM | POA: Diagnosis not present

## 2019-05-12 DIAGNOSIS — I10 Essential (primary) hypertension: Secondary | ICD-10-CM | POA: Diagnosis not present

## 2019-05-12 DIAGNOSIS — F419 Anxiety disorder, unspecified: Secondary | ICD-10-CM | POA: Diagnosis not present

## 2019-05-12 DIAGNOSIS — E039 Hypothyroidism, unspecified: Secondary | ICD-10-CM | POA: Diagnosis not present

## 2019-05-12 DIAGNOSIS — R296 Repeated falls: Secondary | ICD-10-CM | POA: Diagnosis not present

## 2019-05-12 DIAGNOSIS — E785 Hyperlipidemia, unspecified: Secondary | ICD-10-CM | POA: Diagnosis not present

## 2019-05-13 DIAGNOSIS — K648 Other hemorrhoids: Secondary | ICD-10-CM | POA: Diagnosis not present

## 2019-05-13 DIAGNOSIS — D120 Benign neoplasm of cecum: Secondary | ICD-10-CM | POA: Diagnosis not present

## 2019-05-13 DIAGNOSIS — K2950 Unspecified chronic gastritis without bleeding: Secondary | ICD-10-CM | POA: Diagnosis not present

## 2019-05-13 DIAGNOSIS — R197 Diarrhea, unspecified: Secondary | ICD-10-CM | POA: Diagnosis not present

## 2019-05-13 DIAGNOSIS — K269 Duodenal ulcer, unspecified as acute or chronic, without hemorrhage or perforation: Secondary | ICD-10-CM | POA: Diagnosis not present

## 2019-05-13 DIAGNOSIS — K523 Indeterminate colitis: Secondary | ICD-10-CM | POA: Diagnosis not present

## 2019-05-13 DIAGNOSIS — R1310 Dysphagia, unspecified: Secondary | ICD-10-CM | POA: Diagnosis not present

## 2019-05-13 DIAGNOSIS — K2980 Duodenitis without bleeding: Secondary | ICD-10-CM | POA: Diagnosis not present

## 2019-05-13 DIAGNOSIS — R1013 Epigastric pain: Secondary | ICD-10-CM | POA: Diagnosis not present

## 2019-05-27 DIAGNOSIS — D235 Other benign neoplasm of skin of trunk: Secondary | ICD-10-CM | POA: Diagnosis not present

## 2019-05-27 DIAGNOSIS — L814 Other melanin hyperpigmentation: Secondary | ICD-10-CM | POA: Diagnosis not present

## 2019-05-27 DIAGNOSIS — L821 Other seborrheic keratosis: Secondary | ICD-10-CM | POA: Diagnosis not present

## 2019-05-27 DIAGNOSIS — D225 Melanocytic nevi of trunk: Secondary | ICD-10-CM | POA: Diagnosis not present

## 2019-05-27 DIAGNOSIS — D485 Neoplasm of uncertain behavior of skin: Secondary | ICD-10-CM | POA: Diagnosis not present

## 2019-05-27 DIAGNOSIS — R208 Other disturbances of skin sensation: Secondary | ICD-10-CM | POA: Diagnosis not present

## 2019-05-27 DIAGNOSIS — D1801 Hemangioma of skin and subcutaneous tissue: Secondary | ICD-10-CM | POA: Diagnosis not present

## 2019-07-20 DIAGNOSIS — R1310 Dysphagia, unspecified: Secondary | ICD-10-CM | POA: Diagnosis not present

## 2019-07-20 DIAGNOSIS — K279 Peptic ulcer, site unspecified, unspecified as acute or chronic, without hemorrhage or perforation: Secondary | ICD-10-CM | POA: Diagnosis not present

## 2019-07-20 DIAGNOSIS — R1084 Generalized abdominal pain: Secondary | ICD-10-CM | POA: Diagnosis not present

## 2019-08-17 DIAGNOSIS — M542 Cervicalgia: Secondary | ICD-10-CM | POA: Diagnosis not present

## 2019-08-17 DIAGNOSIS — M26609 Unspecified temporomandibular joint disorder, unspecified side: Secondary | ICD-10-CM | POA: Diagnosis not present

## 2019-08-30 ENCOUNTER — Ambulatory Visit (INDEPENDENT_AMBULATORY_CARE_PROVIDER_SITE_OTHER): Payer: Medicare Other | Admitting: Family Medicine

## 2019-08-30 ENCOUNTER — Encounter: Payer: Self-pay | Admitting: Family Medicine

## 2019-08-30 ENCOUNTER — Other Ambulatory Visit: Payer: Self-pay

## 2019-08-30 VITALS — BP 132/70 | HR 90 | Temp 96.3°F | Resp 16 | Ht 66.0 in | Wt 143.0 lb

## 2019-08-30 DIAGNOSIS — I10 Essential (primary) hypertension: Secondary | ICD-10-CM

## 2019-08-30 DIAGNOSIS — M542 Cervicalgia: Secondary | ICD-10-CM | POA: Diagnosis not present

## 2019-08-30 DIAGNOSIS — F411 Generalized anxiety disorder: Secondary | ICD-10-CM | POA: Diagnosis not present

## 2019-08-30 DIAGNOSIS — M26622 Arthralgia of left temporomandibular joint: Secondary | ICD-10-CM | POA: Diagnosis not present

## 2019-08-30 DIAGNOSIS — E785 Hyperlipidemia, unspecified: Secondary | ICD-10-CM

## 2019-08-30 MED ORDER — TIZANIDINE HCL 4 MG PO TABS: 2.0000 mg | ORAL_TABLET | Freq: Two times a day (BID) | ORAL | 0 refills | Status: DC | PRN

## 2019-08-30 NOTE — Patient Instructions (Addendum)
A few things to remember from today's visit:   Arthralgia of left temporomandibular joint  Essential hypertension  Cervicalgia  GAD (generalized anxiety disorder)   Temporomandibular Joint Syndrome  Temporomandibular joint syndrome (TMJ syndrome) is a condition that causes pain in the temporomandibular joints. These joints are located near your ears and allow your jaw to open and close. For people with TMJ syndrome, chewing, biting, or other movements of the jaw can be difficult or painful. TMJ syndrome is often mild and goes away within a few weeks. However, sometimes the condition becomes a long-term (chronic) problem. What are the causes? This condition may be caused by:  Grinding your teeth or clenching your jaw. Some people do this when they are under stress.  Arthritis.  Injury to the jaw.  Head or neck injury.  Teeth or dentures that are not aligned well. In some cases, the cause of TMJ syndrome may not be known. What are the signs or symptoms? The most common symptom of this condition is an aching pain on the side of the head in the area of the TMJ. Other symptoms may include:  Pain when moving your jaw, such as when chewing or biting.  Being unable to open your jaw all the way.  Making a clicking sound when you open your mouth.  Headache.  Earache.  Neck or shoulder pain. How is this diagnosed? This condition may be diagnosed based on:  Your symptoms and medical history.  A physical exam. Your health care provider may check the range of motion of your jaw.  Imaging tests, such as X-rays or an MRI. You may also need to see your dentist, who will determine if your teeth and jaw are lined up correctly. How is this treated? TMJ syndrome often goes away on its own. If treatment is needed, the options may include:  Eating soft foods and applying ice or heat.  Medicines to relieve pain or inflammation.  Medicines or massage to relax the muscles.  A  splint, bite plate, or mouthpiece to prevent teeth grinding or jaw clenching.  Relaxation techniques or counseling to help reduce stress.  A therapy for pain in which an electrical current is applied to the nerves through the skin (transcutaneous electrical nerve stimulation).  Acupuncture. This is sometimes helpful to relieve pain.  Jaw surgery. This is rarely needed. Follow these instructions at home:  Eating and drinking  Eat a soft diet if you are having trouble chewing.  Avoid foods that require a lot of chewing. Do not chew gum. General instructions  Take over-the-counter and prescription medicines only as told by your health care provider.  If directed, put ice on the painful area. ? Put ice in a plastic bag. ? Place a towel between your skin and the bag. ? Leave the ice on for 20 minutes, 2-3 times a day.  Apply a warm, wet cloth (warm compress) to the painful area as directed.  Massage your jaw area and do any jaw stretching exercises as told by your health care provider.  If you were given a splint, bite plate, or mouthpiece, wear it as told by your health care provider.  Keep all follow-up visits as told by your health care provider. This is important. Contact a health care provider if:  You are having trouble eating.  You have new or worsening symptoms. Get help right away if:  Your jaw locks open or closed. Summary  Temporomandibular joint syndrome (TMJ syndrome) is a condition that causes  pain in the temporomandibular joints. These joints are located near your ears and allow your jaw to open and close.  TMJ syndrome is often mild and goes away within a few weeks. However, sometimes the condition becomes a long-term (chronic) problem.  Symptoms include an aching pain on the side of the head in the area of the TMJ, pain when chewing or biting, and being unable to open your jaw all the way. You may also make a clicking sound when you open your mouth.  TMJ  syndrome often goes away on its own. If treatment is needed, it may include medicines to relieve pain, reduce inflammation, or relax the muscles. A splint, bite plate, or mouthpiece may also be used to prevent teeth grinding or jaw clenching. This information is not intended to replace advice given to you by your health care provider. Make sure you discuss any questions you have with your health care provider. Document Released: 05/21/2001 Document Revised: 11/07/2017 Document Reviewed: 10/07/2017 Elsevier Patient Education  [place].  Please be sure medication list is accurate. If a new problem present, please set up appointment sooner than planned today.

## 2019-08-30 NOTE — Progress Notes (Signed)
Chief Complaint  Patient presents with  . Temporomandibular Joint Pain  . Follow-up   HPI: Crystal Levy is a 78 y.o. female, who is here today for chronic disease management and complaining about a few days left TMJ pain. She has had problem is the past and has completed Prednisone x 5 days.  Last follow-up on 03/01/2019, telephone visit.  Pre-auricular pain, left, and supraclavicular pain. Pain is exacerbated by chewing and by palpation. She has not noted erythema or edema on affected area. No hx of trauma. Left-sided fullness sensation (face and ear).  Negative for changes in hearing, ear drainage, sore throat, or recent URI.  Sometimes she wakes up with pain. Intermittent sharp/achy 6/10. + Neck pain with certain movements. Negative for associated chest pain, dyspnea, diaphoresis, or palpitations. No limitation of ROM.  Problem seems to be stable.  Occasionally mild temporal headache, no more than usual.  She taking Tylenol,which helps little. She saw her dentist recently and needs a filling in a left lower molar. Hx of TMJ right side.  Has history of generalized arthritis,mainly IP and hip, getting worse. Unstable gait. She uses a cane at home and when she walks around her neighborhood. 2 falls this year, last year she had about 9.  -HTN: She is on benazepril 10 mg daily.   Negative for visual changes, focal weakness, or edema. She had blood work on 05/30/2019. Glucose 106, creatinine 0.52, EGFR 114. Normal LFTs.  Hyperlipidemia: She has not tolerated pharmacologic treatment in the past. Currently she is on low-fat diet. On 05/30/2019 TC 228, TG 142, HDL 40, and LDL 159.  Anxiety: Currently she is on lorazepam 0.5 mg daily at night, it helps her sleep. She does continue Celexa, she did not feel like medication was helping. Denies suicidal thoughts. She thinks she is dealing well with changes related to [place] 19 pandemia.  Exercise,relaxation  exercising, and meditation also help.  Review of Systems  Constitutional: Positive for fatigue. Negative for activity change, appetite change, chills and fever.  HENT: Negative for mouth sores and nosebleeds.   Respiratory: Negative for cough, shortness of breath and wheezing.   Cardiovascular: Negative for chest pain, palpitations and leg swelling.  Gastrointestinal: Negative for abdominal pain, nausea and vomiting.       Negative for changes in bowel habits.  Genitourinary: Negative for decreased urine volume and hematuria.  Musculoskeletal: Positive for arthralgias and gait problem.  Allergic/Immunologic: Positive for environmental allergies.  Neurological: Negative for syncope and facial asymmetry.  Psychiatric/Behavioral: Negative for confusion.  Rest of ROS, see pertinent positives sand negatives in HPI   Current Outpatient Medications on File Prior to Visit  Medication Sig Dispense Refill  . ALPHA LIPOIC ACID PO Take by mouth daily.    Marland Kitchen b complex vitamins tablet Take 1 tablet by mouth daily.    . benazepril (LOTENSIN) 10 MG tablet Take 10 mg by mouth daily.    . Biotin 1000 MCG tablet Take 1,000 mcg by mouth daily.    . Cholecalciferol (VITAMIN D-3) 1000 UNITS CAPS Take 2 capsules by mouth daily.    . citalopram (CELEXA) 20 MG tablet Take 20 mg by mouth daily.    Marland Kitchen co-enzyme Q-10 50 MG capsule Take 50 mg by mouth daily.    Marland Kitchen HAWTHORN EXTRACT PO Take by mouth daily.    Marland Kitchen lactobacillus acidophilus (BACID) TABS tablet Take 2 tablets by mouth daily.    Marland Kitchen levothyroxine (SYNTHROID) 88 MCG tablet     .  LORazepam (ATIVAN) 0.5 MG tablet Take 0.5 mg by mouth at bedtime as needed for sleep. For sleep/anxiety.    . Magnesium 250 MG TABS Take by mouth daily.    . Multiple Vitamins-Minerals (MULTIVITAL) tablet Take 1 tablet by mouth daily.    . NON FORMULARY 580 mg daily. Bladderwrack    . NON FORMULARY Place under the tongue. [place] Flower Essence    . omeprazole (PRILOSEC) 40 MG  capsule Take 1 capsule (40 mg total) by mouth daily. 30 capsule 3  . OVER THE COUNTER MEDICATION daily. IBS Max    . potassium gluconate 595 MG TABS tablet Take 595 mg by mouth daily.    . Selenium (SELENIMIN PO) Take by mouth daily.    Marland Kitchen VITAMIN K PO Take by mouth as directed.     No current facility-administered medications on file prior to visit.     Past Medical History:  Diagnosis Date  . Anxiety   . Arthritis   . Bicuspid aortic valve   . Depression   . Hypertension   . Hypothyroidism   . Polio   . Thyroid disease    Allergies  Allergen Reactions  . Penicillins Rash  . Azithromycin   . Clindamycin/Lincomycin Diarrhea and Other (See Comments)    C-Diff  . Eggs Or Egg-Derived Products   . Milk-Related Compounds     Unknown reaction  . Sulfa Antibiotics Diarrhea  . Sulfamethoxazole-Trimethoprim   . Fenofibrate Rash    Social History   Socioeconomic History  . Marital status: Divorced    Spouse name: Not on file  . Number of children: 2  . Years of education: Not on file  . Highest education level: Not on file  Occupational History    Employer: [employer]  Tobacco Use  . Smoking status: Never Smoker  . Smokeless tobacco: Never Used  . Tobacco comment: Rare smoker in the distant past.   Substance and Sexual Activity  . Alcohol use: Yes    Alcohol/week: 0.0 standard drinks    Comment: wine  . Drug use: No  . Sexual activity: Not on file  Other Topics Concern  . 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    Social Determinants of Health   Financial Resource Strain:   . Difficulty of Paying Living Expenses: Not on file  Food Insecurity:   . Worried About Charity fundraiser in the Last Year: Not on file  . Ran Out of Food in the Last Year: Not on file  Transportation Needs:   . Lack of Transportation (Medical): Not on file  . Lack of Transportation (Non-Medical): Not on file  Physical  Activity:   . Days of Exercise per Week: Not on file  . Minutes of Exercise per Session: Not on file  Stress:   . Feeling of Stress : Not on file  Social Connections:   . Frequency of Communication with Friends and Family: Not on file  . Frequency of Social Gatherings with Friends and Family: Not on file  . Attends Religious Services: Not on file  . Active Member of Clubs or Organizations: Not on file  . Attends Archivist Meetings: Not on file  . Marital Status: Not on file    Vitals:   08/30/19 1207  BP: 132/70  Pulse: 90  Resp: 16  Temp: (!) 96.3 F (35.7 C)  SpO2: 99%   Body mass index is 23.08 kg/m.  Physical Exam  Nursing note and vitals reviewed. Constitutional: She is oriented to person, place, and time. She appears well-developed and well-nourished. No distress.  HENT:  Head: Normocephalic and atraumatic.    Nose: Right sinus exhibits no maxillary sinus tenderness and no frontal sinus tenderness. Left sinus exhibits no maxillary sinus tenderness and no frontal sinus tenderness.  Mouth/Throat: Oropharynx is clear and moist and mucous membranes are normal.  Tenderness upon palpation of preauricular and retroauricular area. No abnormalities noted on inspection. TMJ with normal ROM. Right TMJ crepitus.  Eyes: Pupils are equal, round, and reactive to light. Conjunctivae are normal.  Cardiovascular: Normal rate and regular rhythm.  No murmur heard. Pulses:      Dorsalis pedis pulses are 2+ on the right side and 2+ on the left side.  Respiratory: Effort normal and breath sounds normal. No respiratory distress.  GI: Soft. She exhibits no mass. There is no hepatomegaly. There is no abdominal tenderness.  Musculoskeletal:        General: No edema.     Cervical back: Tenderness and bony tenderness present. Normal range of motion.       Back:  Lymphadenopathy:    She has no cervical adenopathy.  Neurological: She is alert and oriented to person, place,  and time. She has normal strength. No cranial nerve deficit.  Skin: Skin is warm. No rash noted. No erythema.  Psychiatric: She has a normal mood and affect.  Well groomed, good eye contact.   ASSESSMENT AND PLAN:   Crystal Levy was seen today for chronic disease management and TMJ pain.  Diagnoses and all orders for this visit:  Arthralgia of left temporomandibular joint We discussed possible etiologies. ?  OA vs TMJ syndrome. TMJ syndrome instruction given on AVS. Local heat seems to help. Zanaflex 2 to 4 mg twice daily may help. We discussed some side effects of medication.  -     tiZANidine (ZANAFLEX) 4 MG tablet; Take 0.5-1 tablets (2-4 mg total) by mouth every 12 (twelve) hours as needed for muscle spasms.  Essential hypertension Be adequately controlled. Continue benazepril 10 mg daily. If possible monitor BP regularly. Continue low-salt diet. Follow-up in 5 to 6 months.  Cervicalgia I do not think imaging is needed at this time. Zanaflex may help. If needed we could arrange PT.  -     tiZANidine (ZANAFLEX) 4 MG tablet; Take 0.5-1 tablets (2-4 mg total) by mouth every 12 (twelve) hours as needed for muscle spasms.  GAD (generalized anxiety disorder) Problem is stable. Continue lorazepam 0.5 mg daily at bedtime.  Hyperlipidemia, unspecified hyperlipidemia type She has not tolerated pharmacologic treatment in the past. Continue low-fat diet. We will plan on checking lipid panel next visit.  Return in about 5 months (around 01/28/2020) for HTN,anxiety,hypothyroidism.    Bellina Tokarczyk G. Martinique, MD  [facility]. [place] office.

## 2019-09-27 ENCOUNTER — Other Ambulatory Visit: Payer: Self-pay

## 2019-09-27 MED ORDER — PANTOPRAZOLE SODIUM 40 MG PO TBEC: 40.0000 mg | DELAYED_RELEASE_TABLET | Freq: Every day | ORAL | 2 refills | Status: DC

## 2019-09-27 MED ORDER — BENAZEPRIL HCL 10 MG PO TABS: 10.0000 mg | ORAL_TABLET | Freq: Every day | ORAL | 2 refills | Status: DC

## 2019-09-27 MED ORDER — LEVOTHYROXINE SODIUM 88 MCG PO TABS: 88.0000 ug | ORAL_TABLET | Freq: Every day | ORAL | 2 refills | Status: DC

## 2019-09-28 ENCOUNTER — Other Ambulatory Visit: Payer: Self-pay

## 2019-09-28 ENCOUNTER — Other Ambulatory Visit: Payer: Self-pay | Admitting: Family Medicine

## 2019-09-28 NOTE — Telephone Encounter (Signed)
Refill request for: Lorazepam  0.5mg  tablet Last Refill: 09/06/2019 @ [facility] for 30 tabs. Last Office Visit: 08/30/2019. Next Office Visit: Not scheduled currently. Med Contract: Not on file.

## 2019-10-01 MED ORDER — LORAZEPAM 0.5 MG PO TABS: 0.5000 mg | ORAL_TABLET | Freq: Every evening | ORAL | 2 refills | Status: DC | PRN

## 2019-10-22 DIAGNOSIS — H16143 Punctate keratitis, bilateral: Secondary | ICD-10-CM | POA: Diagnosis not present

## 2019-10-22 DIAGNOSIS — H11443 Conjunctival cysts, bilateral: Secondary | ICD-10-CM | POA: Diagnosis not present

## 2019-10-22 DIAGNOSIS — H43393 Other vitreous opacities, bilateral: Secondary | ICD-10-CM | POA: Diagnosis not present

## 2019-10-22 DIAGNOSIS — H1045 Other chronic allergic conjunctivitis: Secondary | ICD-10-CM | POA: Diagnosis not present

## 2019-10-22 DIAGNOSIS — D3131 Benign neoplasm of right choroid: Secondary | ICD-10-CM | POA: Diagnosis not present

## 2019-12-16 ENCOUNTER — Other Ambulatory Visit: Payer: Self-pay | Admitting: Family Medicine

## 2019-12-28 DIAGNOSIS — K219 Gastro-esophageal reflux disease without esophagitis: Secondary | ICD-10-CM | POA: Diagnosis not present

## 2019-12-28 DIAGNOSIS — K5900 Constipation, unspecified: Secondary | ICD-10-CM | POA: Diagnosis not present

## 2020-01-31 DIAGNOSIS — I739 Peripheral vascular disease, unspecified: Secondary | ICD-10-CM | POA: Insufficient documentation

## 2020-01-31 DIAGNOSIS — F419 Anxiety disorder, unspecified: Secondary | ICD-10-CM | POA: Insufficient documentation

## 2020-01-31 NOTE — Progress Notes (Signed)
HPI:  Ms.Peg Farran Sanker is a 79 y.o. female, who is here today for chronic disease management.  She was last seen on 08/30/19.  GERD,last seen by GI 11/2019. She was on Protonix 40 mg bid, decreased to once daily. S/P esophageal stenosis.  Last EGD 05/2019.  IP arthralgias, chronic. Exacerbated by repetitive manual activities. No edema or erythema. + Cervicalgia and knee pain, s/p left knee total arthroplasty. She follows with ortho as needed.  Anxiety and depression: She is not longer on Celexa. Takes Lorazepam as needed. Sleeps about 7 hours but wakes up a few times per night (2-4).  Right foot edema seems to be worse. No erythema or calf pain. Negative for recent trauma,surgery,long travel,and no on hormonal therapy. She has followed with vascular, RLE PAD.  CTA abdominal aorta and bilateral iliofemoral runoff on 05/24/11:  1. Relatively small caliber right common femoral artery. Occluded right  superficial femoral artery. Reconstitution of the proximal right  popliteal artery which is also relatively small caliber and there is an approximately 30 percent proximal stenosis. Two vessel runoff below the right knee.  2. Two vessel runoff below the left knee with severe distal attenuation  Good exercise response, it was thought vascular changes were related to hx of polio.  LE elevation helps, in the morning edema is not present. Hx of polio with residual RLE paralysis.  Edema is worse at the end of the day and alleviated when removing brace for a few minutes. Prolonged standing/walking exacerbate problem.   HTN: Dx'ed 15+ years ago. Negative for severe/frequent headache, visual changes, chest pain, dyspnea, palpitation, or new focal weakness. She is on Benazepril 10 mg daily.  Hypothyroidism: S/P Hashimoto's thyroiditis. She is on Levothyroxine 88 mcg daily. She followed with endocrinologist. Last TSH normal at 0.98 in 02/2019. She has not noted abnormal wt  loss,cold/heat intolerance,or tremor,  Review of Systems  Constitutional: Positive for fatigue. Negative for activity change, appetite change and fever.  HENT: Negative for mouth sores, nosebleeds and sore throat.   Respiratory: Negative for cough and wheezing.   Gastrointestinal: Negative for abdominal pain, nausea and vomiting.       Negative for changes in bowel habits.  Genitourinary: Negative for decreased urine volume and hematuria.  Musculoskeletal: Positive for gait problem.  Neurological: Negative for syncope and facial asymmetry.  Psychiatric/Behavioral: Negative for confusion. The patient is nervous/anxious.   Rest of ROS, see pertinent positives sand negatives in HPI  Current Outpatient Medications on File Prior to Visit  Medication Sig Dispense Refill  . Alpha-Lipoic Acid 100 MG TABS Take by mouth.    Marland Kitchen b complex vitamins tablet Take 1 tablet by mouth daily.    . benazepril (LOTENSIN) 10 MG tablet Take 1 tablet (10 mg total) by mouth daily. 90 tablet 2  . Biotin 1000 MCG tablet Take 1,000 mcg by mouth daily.    . calcium carbonate (OSCAL) 1500 (600 Ca) MG TABS tablet Take by mouth.    . Cholecalciferol (VITAMIN D-3) 1000 UNITS CAPS Take 2 capsules by mouth daily.    Marland Kitchen co-enzyme Q-10 50 MG capsule Take 50 mg by mouth daily.    Marland Kitchen DIGESTIVE ENZYMES PO Take by mouth.    . levothyroxine (SYNTHROID) 88 MCG tablet Take 1 tablet (88 mcg total) by mouth daily before breakfast. 90 tablet 2  . LORazepam (ATIVAN) 0.5 MG tablet TAKE 1 TABLET BY MOUTH AT  BEDTIME AS NEEDED FOR SLEEP / ANXIETY 30 tablet 3  .  Magnesium 100 MG CAPS Take by mouth.    . magnesium hydroxide (MILK OF MAGNESIA) 400 MG/5ML suspension Take by mouth.    . Milk Thistle Extract 175 MG TABS Take by mouth.    . Multiple Vitamins-Minerals (MULTIVITAL) tablet Take 1 tablet by mouth daily.    . NON FORMULARY Place under the tongue. [place] Flower Essence    . pantoprazole (PROTONIX) 40 MG tablet Take 1 tablet (40 mg  total) by mouth daily. 90 tablet 2  . potassium gluconate 595 MG TABS tablet Take 595 mg by mouth daily.    . Prenatal Vit-Fe Fumarate-FA (PRENATAL VITAMIN PO) Take by mouth.    . Selenium (SELENIMIN PO) Take by mouth daily.     No current facility-administered medications on file prior to visit.   Past Medical History:  Diagnosis Date  . Anxiety   . Arthritis   . Bicuspid aortic valve   . Depression   . Hypertension   . Hypothyroidism   . Polio   . Thyroid disease    Allergies  Allergen Reactions  . Clindamycin/Lincomycin Diarrhea and Other (See Comments)    C-Diff  . Fluticasone     Headache and nosebleed   . Azithromycin   . Eggs Or Egg-Derived Products   . Milk-Related Compounds     Unknown reaction  . Penicillins Rash  . Sulfa Antibiotics Diarrhea  . Sulfamethoxazole-Trimethoprim   . Fenofibrate Rash    Social History   Socioeconomic History  . Marital status: Divorced    Spouse name: Not on file  . Number of children: 2  . Years of education: Not on file  . Highest education level: Not on file  Occupational History    Employer: [employer]  Tobacco Use  . Smoking status: Never Smoker  . Smokeless tobacco: Never Used  . Tobacco comment: Rare smoker in the distant past.   Substance and Sexual Activity  . Alcohol use: Yes    Alcohol/week: 0.0 standard drinks    Comment: wine  . Drug use: No  . Sexual activity: Not on file  Other Topics Concern  . 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    Social Determinants of Health   Financial Resource Strain:   . Difficulty of Paying Living Expenses:   Food Insecurity:   . Worried About Charity fundraiser in the Last Year:   . Arboriculturist in the Last Year:   Transportation Needs:   . Film/video editor (Medical):   Marland Kitchen Lack of Transportation (Non-Medical):   Physical Activity:   . Days of Exercise per Week:   . Minutes of Exercise per  Session:   Stress:   . Feeling of Stress :   Social Connections:   . Frequency of Communication with Friends and Family:   . Frequency of Social Gatherings with Friends and Family:   . Attends Religious Services:   . Active Member of Clubs or Organizations:   . Attends Archivist Meetings:   Marland Kitchen Marital Status:     Vitals:   02/01/20 0953  BP: 120/60  Pulse: 71  Resp: 16  Temp: 97.9 F (36.6 C)  SpO2: 98%   Body mass index is 24.43 kg/m.  Physical Exam  Nursing note and vitals reviewed. Constitutional: She is oriented to person, place, and time. She appears well-developed. No distress.  HENT:  Head: Normocephalic and atraumatic.  Mouth/Throat: Oropharynx is clear  and moist and mucous membranes are normal.  Eyes: Pupils are equal, round, and reactive to light. Conjunctivae are normal.  Cardiovascular: Normal rate and regular rhythm.  No murmur heard. DP and PT present bilateral.  Respiratory: Effort normal and breath sounds normal. No respiratory distress.  GI: Soft. She exhibits no mass. There is no hepatomegaly. There is no abdominal tenderness.  Musculoskeletal:        General: Edema (RLE trace pitting edema.) present.     Comments: No sign of synovitis. RLE muscle atrophy and brace.  Lymphadenopathy:    She has no cervical adenopathy.  Neurological: She is alert and oriented to person, place, and time. She has normal strength. No cranial nerve deficit. Gait abnormal.  Mildly unstable gait assisted with a cane.  Skin: Skin is warm. No rash noted. No erythema.  Psychiatric: Her mood appears anxious.  Well groomed, good eye contact.    ASSESSMENT AND PLAN:  Ms. Jerusalem Ingalsbe was seen today for chronic disease management.  Orders Placed This Encounter  Procedures  . Basic metabolic panel  . TSH   Lab Results  Component Value Date   TSH 0.50 02/01/2020   Lab Results  Component Value Date   CREATININE 0.52 02/01/2020   BUN 12 02/01/2020   NA  141 02/01/2020   K 4.0 02/01/2020   CL 103 02/01/2020   CO2 29 02/01/2020   Generalized osteoarthritis of multiple sites We discussed dx,prognosis,and treatment options. After reviewing some side effects, she agrees with trying Cymbalta 20 mg. Tylenol 500 mg tid prn. Fall precautions.  HTN (hypertension) BP is adequately controlled. No changes in current management. Continue low salt diet.   Anxiety disorder, unspecified Problem is mildly worse. She agrees with trying low dose Cymbalta. Continue Lorazepam 0.5 mg daily as needed.  Hypothyroidism (acquired) No changes in current management, will follow labs done today and will give further recommendations accordingly.   GERD (gastroesophageal reflux disease) Continue Protonix 40 mg daily. GERD precautions. Following with GI.   Lower extremity edema We discussed possible etiologies,? venous disease. LE elevation and compression stocking may help. Good skin care.  PAD (peripheral artery disease) ([place]) Not longer following with vascular. Arteries occlusion ? Polio related rather than ischemic. She has not tolerated statin medication and fenofibrate. Not on antiplatelet therapy. Next visit we can decide about adding Aspirin and/or ABI for follow up.  Return in about 3 months (around 05/03/2020) for OA.    Marcele Kosta G. Martinique, MD  [facility]. [place] office.  A few things to remember from today's visit:   Cymbalta 20 mg started today for mood and arthritis. No changes in rest of meds.  Lower extremities elevation above heart level for swelling. Good skin care.  If you need refills please call your pharmacy. Do not use My Chart to request refills or for acute issues that need immediate attention.   Please be sure medication list is accurate. If a new problem present, please set up appointment sooner than planned today.

## 2020-02-01 ENCOUNTER — Other Ambulatory Visit: Payer: Self-pay

## 2020-02-01 ENCOUNTER — Ambulatory Visit (INDEPENDENT_AMBULATORY_CARE_PROVIDER_SITE_OTHER): Payer: Medicare Other | Admitting: Family Medicine

## 2020-02-01 ENCOUNTER — Encounter: Payer: Self-pay | Admitting: Family Medicine

## 2020-02-01 VITALS — BP 120/60 | HR 71 | Temp 97.9°F | Resp 16 | Ht 66.0 in | Wt 151.4 lb

## 2020-02-01 DIAGNOSIS — K219 Gastro-esophageal reflux disease without esophagitis: Secondary | ICD-10-CM | POA: Diagnosis not present

## 2020-02-01 DIAGNOSIS — E039 Hypothyroidism, unspecified: Secondary | ICD-10-CM

## 2020-02-01 DIAGNOSIS — M159 Polyosteoarthritis, unspecified: Secondary | ICD-10-CM

## 2020-02-01 DIAGNOSIS — F419 Anxiety disorder, unspecified: Secondary | ICD-10-CM

## 2020-02-01 DIAGNOSIS — I10 Essential (primary) hypertension: Secondary | ICD-10-CM

## 2020-02-01 DIAGNOSIS — I739 Peripheral vascular disease, unspecified: Secondary | ICD-10-CM

## 2020-02-01 DIAGNOSIS — R600 Localized edema: Secondary | ICD-10-CM

## 2020-02-01 LAB — TSH: TSH: 0.5 u[IU]/mL (ref 0.35–4.50)

## 2020-02-01 LAB — BASIC METABOLIC PANEL
BUN: 12 mg/dL (ref 6–23)
CO2: 29 mEq/L (ref 19–32)
Calcium: 9.2 mg/dL (ref 8.4–10.5)
Chloride: 103 mEq/L (ref 96–112)
Creatinine, Ser: 0.52 mg/dL (ref 0.40–1.20)
GFR: 113.72 mL/min (ref >60.00)
Glucose, Bld: 104 mg/dL — ABNORMAL HIGH (ref 70–99)
Potassium: 4 mEq/L (ref 3.5–5.1)
Sodium: 141 mEq/L (ref 135–145)

## 2020-02-01 MED ORDER — DULOXETINE HCL 20 MG PO CPEP: 20.0000 mg | ORAL_CAPSULE | Freq: Every day | ORAL | 2 refills | Status: DC

## 2020-02-01 NOTE — Patient Instructions (Addendum)
A few things to remember from today's visit:   Cymbalta 20 mg started today for mood and arthritis. No changes in rest of meds.  Lower extremities elevation above heart level for swelling. Good skin care.  If you need refills please call your pharmacy. Do not use My Chart to request refills or for acute issues that need immediate attention.   Please be sure medication list is accurate. If a new problem present, please set up appointment sooner than planned today.

## 2020-02-01 NOTE — Assessment & Plan Note (Signed)
No changes in current management, will follow labs done today and will give further recommendations accordingly.  

## 2020-02-01 NOTE — Assessment & Plan Note (Addendum)
Problem is mildly worse. She agrees with trying low dose Cymbalta. Continue Lorazepam 0.5 mg daily as needed.

## 2020-02-01 NOTE — Assessment & Plan Note (Signed)
Continue Protonix 40 mg daily. GERD precautions. Following with GI.

## 2020-02-01 NOTE — Assessment & Plan Note (Signed)
BP is adequately controlled. No changes in current management. Continue low salt diet.

## 2020-02-02 ENCOUNTER — Other Ambulatory Visit: Payer: Self-pay | Admitting: *Deleted

## 2020-02-02 ENCOUNTER — Telehealth: Payer: Self-pay | Admitting: Family Medicine

## 2020-02-02 DIAGNOSIS — F419 Anxiety disorder, unspecified: Secondary | ICD-10-CM

## 2020-02-02 DIAGNOSIS — M159 Polyosteoarthritis, unspecified: Secondary | ICD-10-CM

## 2020-02-02 MED ORDER — DULOXETINE HCL 20 MG PO CPEP: 20.0000 mg | ORAL_CAPSULE | Freq: Every day | ORAL | 2 refills | Status: DC

## 2020-02-02 NOTE — Telephone Encounter (Signed)
Crystal Levy is calling in stating that the pt is at the pharmacy looking for her Rx duloxetine that was sent to [facility].  Can it be sent to the [facility] on [address].

## 2020-02-02 NOTE — Telephone Encounter (Signed)
Rx sent as requested.

## 2020-02-06 ENCOUNTER — Encounter: Payer: Self-pay | Admitting: Family Medicine

## 2020-02-10 NOTE — Telephone Encounter (Signed)
Medication Refill:  Lorazepam  Pharmacy: [place]: [phone]   Pt would like a call back at [phone]

## 2020-02-10 NOTE — Telephone Encounter (Signed)
Pt is calling in to see if she can only get 3-4 pills sent in to [facility] on [address] b/c [facility] has sent out the Rx in the mail and should be receiving it on Tuesday 02/15/2020. Pt is aware that provider is out of the office.

## 2020-02-11 NOTE — Telephone Encounter (Signed)
Clinic RN spoke with patient. Informed patient that provider is out until Monday and the covering provider does not prescribe or refill this type of medication.  Patient verbalized understanding. Patient reports medication should have been sent to [facility].

## 2020-02-14 NOTE — Telephone Encounter (Signed)
She has medication available at her pharmacy to fill when she is due. Rx for Lorazepam was sent during her last visit, 12/17/19, next refill was due 01/12/20 (12/13/19 was her previous refill). 3 refills were sent with last Rx. Thanks, BJ

## 2020-02-15 ENCOUNTER — Encounter: Payer: Self-pay | Admitting: *Deleted

## 2020-02-15 NOTE — Telephone Encounter (Signed)
Pt received her medications at the post office and wanted to make [facility] aware.

## 2020-02-15 NOTE — Telephone Encounter (Signed)
Spoke with patient to follow-up and she stated that Rx was sent to [facility] instead of [facility]. Patient stated that at last ov, she discussed with provider concerning the pharmacy change due to [facility] taking so long with getting medications out. Patient stated that her medication is supposed to come in today, after she go to post office, she will call me before the end of the day to let me know if she received it.

## 2020-02-15 NOTE — Telephone Encounter (Signed)
Noted  

## 2020-03-07 ENCOUNTER — Other Ambulatory Visit: Payer: Self-pay | Admitting: Family Medicine

## 2020-03-07 DIAGNOSIS — M159 Polyosteoarthritis, unspecified: Secondary | ICD-10-CM

## 2020-03-07 DIAGNOSIS — F419 Anxiety disorder, unspecified: Secondary | ICD-10-CM

## 2020-03-09 ENCOUNTER — Telehealth: Payer: Self-pay | Admitting: Family Medicine

## 2020-03-09 NOTE — Telephone Encounter (Signed)
Pt would like to get off cymbalta but needs direction on how she should do that    Pt can be reached at [phone]

## 2020-03-10 ENCOUNTER — Other Ambulatory Visit: Payer: Self-pay | Admitting: Family Medicine

## 2020-03-10 NOTE — Telephone Encounter (Signed)
She has not been on Cymbalta for a long time, so she can take 1 capsule every other day for 7 days then every third day for 10 days and then stop it. Thanks, BG

## 2020-03-10 NOTE — Telephone Encounter (Signed)
I called and spoke with patient. We went over the information below & she verbalized understanding.  

## 2020-03-21 ENCOUNTER — Other Ambulatory Visit: Payer: Self-pay

## 2020-03-21 ENCOUNTER — Ambulatory Visit (INDEPENDENT_AMBULATORY_CARE_PROVIDER_SITE_OTHER): Payer: Medicare Other | Admitting: Family Medicine

## 2020-03-21 ENCOUNTER — Encounter: Payer: Self-pay | Admitting: Family Medicine

## 2020-03-21 VITALS — BP 110/70 | HR 72 | Temp 98.1°F | Resp 16 | Ht 66.0 in | Wt 148.0 lb

## 2020-03-21 DIAGNOSIS — G4700 Insomnia, unspecified: Secondary | ICD-10-CM | POA: Diagnosis not present

## 2020-03-21 DIAGNOSIS — I739 Peripheral vascular disease, unspecified: Secondary | ICD-10-CM | POA: Diagnosis not present

## 2020-03-21 DIAGNOSIS — F419 Anxiety disorder, unspecified: Secondary | ICD-10-CM

## 2020-03-21 MED ORDER — TRAZODONE HCL 50 MG PO TABS: 50.0000 mg | ORAL_TABLET | Freq: Every day | ORAL | 2 refills | Status: DC

## 2020-03-21 NOTE — Assessment & Plan Note (Addendum)
She did not tolerate Cymbalta well. Trazodone added today. Continue Lorazepam 0.5 mg daily prn.  When she is due for refill, lorazepam will be sent to her local pharmacy.  We also discussed current recommendations in regard to alcohol intake in women, up to 4 ounces of wine daily.

## 2020-03-21 NOTE — Patient Instructions (Signed)
A few things to remember from today's visit:   Insomnia, unspecified type - Plan: traZODone (DESYREL) 50 MG tablet  Anxiety disorder, unspecified type  PAD (peripheral artery disease) ([place]) - Plan: VAS [place] ABI WITH/WO TBI  Lower extremities circulation test will be arranged. Trazodone started today, take it 30 min before bedtime, 1/2 tab and increase to whole tab in 2 weeks if well tolerated.  Lorazepam to be transferred to [facility].  If you need refills please call your pharmacy. Do not use My Chart to request refills or for acute issues that need immediate attention.    Please be sure medication list is accurate. If a new problem present, please set up appointment sooner than planned today.

## 2020-03-21 NOTE — Assessment & Plan Note (Addendum)
CTA abdominal aorta and bilateral iliofemoral runoff on 05/24/11:1. Relatively small caliber right common femoral artery. Occluded right superficial femoral artery. Reconstitution of the proximal right popliteal artery which is also relatively small caliber and there is an  approximately 30 percent proximal stenosis. Two vessel runoff below the  right knee.  2. Two vessel runoff below the left knee with severe distal attenuation   Continue aspirins 81 mg daily. Adequate skin care, trauma prevention. ABI will be arranged.

## 2020-03-21 NOTE — Assessment & Plan Note (Addendum)
After discussion of a few pharmacologic options and some side effects,she agrees with trying Trazodone. Start Trazodone 25 mg at bedtime and increase to 50 mg in 2 weeks if well tolerated. Good sleep hygiene was recommended.

## 2020-03-21 NOTE — Progress Notes (Signed)
Chief Complaint  Patient presents with  . Follow-up   HPI: [name] is a 79 y.o. female, who is here today for follow up.   She was last seen on 02/01/20. Cymbalta caused diarrhea. Medication really help with OA pain.  Anxiety: Currently she is on lorazepam 0.5 mg daily as needed. She usually takes it around bedtime to help her sleep. She has had problems with her mail order, usually late, so she does not have medication for about 2 to 3 days. She would like to change to local pharmacy. Tolerated medication well.  Cymbalta did not help.  Insomnia: Waking up earlier, around 4 AM, not able to go back to sleep. Sometimes she takes Benadryl and/or 2 glasses of wine.  RLE cold sensation, chronic. PAD:She is on aspirins 81 mg daily. She has not tolerated statins well in the past. Allergy to fenofibrate caused rash in 2014. Negative for lower extremity pain, unusual edema, or erythema. No skin lsions or ulcers. RLE muscle atrophy, s/p polio. HLD: FLP in 05/30/2019: TC 228, HDL 40, LDL 159, and TG 142.  Review of Systems  Constitutional: Positive for fatigue. Negative for activity change, appetite change and fever.  Respiratory: Negative for cough, shortness of breath and wheezing.   Cardiovascular: Negative for chest pain and palpitations.  Gastrointestinal: Negative for abdominal pain, nausea and vomiting.       Negative for changes in bowel habits.  Genitourinary: Negative for decreased urine volume and hematuria.  Musculoskeletal: Positive for arthralgias and gait problem.  Neurological: Negative for syncope, weakness and headaches.  Psychiatric/Behavioral: Negative for confusion. The patient is nervous/anxious.   Rest of ROS, see pertinent positives sand negatives in HPI  Current Outpatient Medications on File Prior to Visit  Medication Sig Dispense Refill  . Alpha-Lipoic Acid 100 MG TABS Take by mouth.    Marland Kitchen b complex vitamins tablet Take 1 tablet by mouth  daily.    . benazepril (LOTENSIN) 10 MG tablet Take 1 tablet (10 mg total) by mouth daily. 90 tablet 2  . Biotin 1000 MCG tablet Take 1,000 mcg by mouth daily.    . calcium carbonate (OSCAL) 1500 (600 Ca) MG TABS tablet Take by mouth.    . Cholecalciferol (VITAMIN D-3) 1000 UNITS CAPS Take 2 capsules by mouth daily.    Marland Kitchen co-enzyme Q-10 50 MG capsule Take 50 mg by mouth daily.    Marland Kitchen DIGESTIVE ENZYMES PO Take by mouth.    . levothyroxine (SYNTHROID) 88 MCG tablet Take 1 tablet (88 mcg total) by mouth daily before breakfast. 90 tablet 2  . LORazepam (ATIVAN) 0.5 MG tablet TAKE 1 TABLET BY MOUTH AT  BEDTIME AS NEEDED FOR SLEEP / ANXIETY 30 tablet 3  . Magnesium 100 MG CAPS Take by mouth.    . magnesium hydroxide (MILK OF MAGNESIA) 400 MG/5ML suspension Take by mouth.    . Milk Thistle Extract 175 MG TABS Take by mouth.    . Multiple Vitamins-Minerals (MULTIVITAL) tablet Take 1 tablet by mouth daily.    . NON FORMULARY Place under the tongue. [place] Flower Essence    . pantoprazole (PROTONIX) 40 MG tablet Take 1 tablet (40 mg total) by mouth daily. 90 tablet 2  . potassium gluconate 595 MG TABS tablet Take 595 mg by mouth daily.    . Prenatal Vit-Fe Fumarate-FA (PRENATAL VITAMIN PO) Take by mouth.    . Selenium (SELENIMIN PO) Take by mouth daily.     No current facility-administered medications  on file prior to visit.   Past Medical History:  Diagnosis Date  . Anxiety   . Arthritis   . Bicuspid aortic valve   . Depression   . Hypertension   . Hypothyroidism   . Polio   . Thyroid disease    Allergies  Allergen Reactions  . Clindamycin/Lincomycin Diarrhea and Other (See Comments)    C-Diff  . Fluticasone     Headache and nosebleed   . Azithromycin   . Eggs Or Egg-Derived Products   . Milk-Related Compounds     Unknown reaction  . Penicillins Rash  . Sulfa Antibiotics Diarrhea  . Sulfamethoxazole-Trimethoprim   . Fenofibrate Rash    Social History   Socioeconomic History   . Marital status: Divorced    Spouse name: Not on file  . Number of children: 2  . Years of education: Not on file  . Highest education level: Not on file  Occupational History    Employer: [employer]  Tobacco Use  . Smoking status: Never Smoker  . Smokeless tobacco: Never Used  . Tobacco comment: Rare smoker in the distant past.   Substance and Sexual Activity  . Alcohol use: Yes    Alcohol/week: 0.0 standard drinks    Comment: wine  . Drug use: No  . Sexual activity: Not on file  Other Topics Concern  . 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    Social Determinants of Health   Financial Resource Strain:   . Difficulty of Paying Living Expenses:   Food Insecurity:   . Worried About Charity fundraiser in the Last Year:   . Arboriculturist in the Last Year:   Transportation Needs:   . Film/video editor (Medical):   Marland Kitchen Lack of Transportation (Non-Medical):   Physical Activity:   . Days of Exercise per Week:   . Minutes of Exercise per Session:   Stress:   . Feeling of Stress :   Social Connections:   . Frequency of Communication with Friends and Family:   . Frequency of Social Gatherings with Friends and Family:   . Attends Religious Services:   . Active Member of Clubs or Organizations:   . Attends Archivist Meetings:   Marland Kitchen Marital Status:    Vitals:   03/21/20 1054  BP: 110/70  Pulse: 72  Resp: 16  Temp: 98.1 F (36.7 C)  SpO2: 97%   Body mass index is 23.89 kg/m.  Physical Exam Vitals and nursing note reviewed.  Constitutional:      General: She is not in acute distress.    Appearance: She is well-developed and normal weight.  HENT:     Head: Normocephalic and atraumatic.  Eyes:     Conjunctiva/sclera: Conjunctivae normal.  Cardiovascular:     Rate and Rhythm: Normal rate and regular rhythm.     Heart sounds: No murmur heard.      Comments: DP pulses present, right one  difficult to palpate. Mildly cold distal right extremity but normal capillary refill. Pulmonary:     Effort: Pulmonary effort is normal. No respiratory distress.     Breath sounds: Normal breath sounds.  Abdominal:     Palpations: Abdomen is soft.     Tenderness: There is no abdominal tenderness.  Skin:    General: Skin is warm.     Findings: No erythema or rash.  Neurological:     Mental Status:  She is alert and oriented to person, place, and time.     Comments: Gait assisted by a cane.  Psychiatric:     Comments: Well groomed, good eye contact.    ASSESSMENT AND PLAN:  [name] was seen today for follow-up.  Orders Placed This Encounter  Procedures  . VAS [place] ABI WITH/WO TBI   PAD (peripheral artery disease) ([facility]) CTA abdominal aorta and bilateral iliofemoral runoff on 05/24/11:1. Relatively small caliber right common femoral artery. Occluded right superficial femoral artery. Reconstitution of the proximal right popliteal artery which is also relatively small caliber and there is an  approximately 30 percent proximal stenosis. Two vessel runoff below the  right knee.  2. Two vessel runoff below the left knee with severe distal attenuation   Continue aspirins 81 mg daily. Adequate skin care, trauma prevention. ABI will be arranged.  Insomnia After discussion of a few pharmacologic options and some side effects,she agrees with trying Trazodone. Start Trazodone 25 mg at bedtime and increase to 50 mg in 2 weeks if well tolerated. Good sleep hygiene was recommended.  Anxiety disorder, unspecified She did not tolerate Cymbalta well. Trazodone added today. Continue Lorazepam 0.5 mg daily prn.  When she is due for refill, lorazepam will be sent to her local pharmacy.  We also discussed current recommendations in regard to alcohol intake in women, up to 4 ounces of wine daily.   Return in about 3 months (around 06/21/2020) for Insomnia,HTN.   Maelynn Moroney G. Martin[place],  MD  [facility]. [place] office.   A few things to remember from today's visit:  Lower extremities circulation test will be arranged. Trazodone started today, take it 30 min before bedtime, 1/2 tab and increase to whole tab in 2 weeks if well tolerated.  Lorazepam to be transferred to [facility].  If you need refills please call your pharmacy. Do not use My Chart to request refills or for acute issues that need immediate attention.    Please be sure medication list is accurate. If a new problem present, please set up appointment sooner than planned today.

## 2020-03-30 ENCOUNTER — Ambulatory Visit (HOSPITAL_COMMUNITY)
Admission: RE | Admit: 2020-03-30 | Discharge: 2020-03-30 | Disposition: A | Discharge status: Home / Self Care | Payer: Medicare Other | Source: Ambulatory Visit | Attending: Family Medicine | Admitting: Family Medicine

## 2020-03-30 ENCOUNTER — Other Ambulatory Visit: Payer: Self-pay

## 2020-03-30 DIAGNOSIS — I739 Peripheral vascular disease, unspecified: Secondary | ICD-10-CM | POA: Diagnosis not present

## 2020-04-11 ENCOUNTER — Other Ambulatory Visit: Payer: Self-pay

## 2020-04-11 ENCOUNTER — Ambulatory Visit (INDEPENDENT_AMBULATORY_CARE_PROVIDER_SITE_OTHER): Payer: Medicare Other | Admitting: Family Medicine

## 2020-04-11 ENCOUNTER — Encounter: Payer: Self-pay | Admitting: Family Medicine

## 2020-04-11 VITALS — BP 136/70 | HR 92 | Temp 98.4°F | Resp 16 | Ht 66.0 in | Wt 147.2 lb

## 2020-04-11 DIAGNOSIS — I739 Peripheral vascular disease, unspecified: Secondary | ICD-10-CM

## 2020-04-11 DIAGNOSIS — M159 Polyosteoarthritis, unspecified: Secondary | ICD-10-CM | POA: Diagnosis not present

## 2020-04-11 DIAGNOSIS — G4700 Insomnia, unspecified: Secondary | ICD-10-CM

## 2020-04-11 DIAGNOSIS — E785 Hyperlipidemia, unspecified: Secondary | ICD-10-CM

## 2020-04-11 DIAGNOSIS — F419 Anxiety disorder, unspecified: Secondary | ICD-10-CM

## 2020-04-11 MED ORDER — LORAZEPAM 1 MG PO TABS: 1.0000 mg | ORAL_TABLET | Freq: Every evening | ORAL | 2 refills | Status: DC | PRN

## 2020-04-11 MED ORDER — ROSUVASTATIN CALCIUM 5 MG PO TABS: 5.0000 mg | ORAL_TABLET | Freq: Every day | ORAL | 1 refills | Status: DC

## 2020-04-11 NOTE — Assessment & Plan Note (Addendum)
Cymbalta helped but caused insomnia. Recommend considering trying medication again in a few weeks. Tylenol 500 mg 3-4 tabs daily if needed.

## 2020-04-11 NOTE — Progress Notes (Signed)
HPI: Crystal Levy is a 79 y.o. female, who is here today for follow up.   She was last seen on 03/21/20.  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 No joint edema or erythema.  Insomnia: Trazodone did not help,caused headache. To help with sleep, she is drinking 4-5 oz of wine,taking Benadryl 25 mg,and Lorazepam 0.5 mg at bedtime. Sleeping 4-6 hours.  HLD: She is not on pharmacologic treatment. She is afraid of side effects, mainly diabetes.  05/12/19 FLP: TC 228 LDL 159 HDL 40 and TG 142.  PAD: She had ABI done on 03/30/20. Right: Resting right ankle-brachial index indicates moderate right lower extremity arterial disease. The right toe-brachial index is abnormal. RT great toe pressure = 94 mmHg.  Left: Resting left ankle-brachial index is within normal range. The left toe-brachial index is abnormal. LT Great toe pressure = 85 mmHg.  No pain but cold like sensation.  Review of Systems  Constitutional: Positive for fatigue. Negative for activity change, appetite change and fever.  HENT: Negative for mouth sores, nosebleeds and sore throat.   Respiratory: Negative for cough, shortness of breath and wheezing.   Cardiovascular: Negative for chest pain, palpitations and leg swelling.  Gastrointestinal: Negative for abdominal pain, nausea and vomiting.       Negative for changes in bowel habits.  Neurological: Negative for syncope and headaches.  Rest of ROS, see pertinent positives sand negatives in HPI  Current Outpatient Medications on File Prior to Visit  Medication Sig Dispense Refill   Alpha-Lipoic Acid 100 MG TABS Take by mouth.     b complex vitamins tablet Take 1 tablet by mouth daily.     benazepril (LOTENSIN) 10 MG tablet Take 1 tablet (10 mg total) by mouth daily. 90 tablet 2   Biotin 1000 MCG tablet Take 1,000 mcg by mouth daily.     calcium carbonate (OSCAL) 1500 (600 Ca) MG TABS tablet Take by mouth.      Cholecalciferol (VITAMIN D-3) 1000 UNITS CAPS Take 2 capsules by mouth daily.     co-enzyme Q-10 50 MG capsule Take 50 mg by mouth daily.     DIGESTIVE ENZYMES PO Take by mouth.     levothyroxine (SYNTHROID) 88 MCG tablet Take 1 tablet (88 mcg total) by mouth daily before breakfast. 90 tablet 2   Magnesium 100 MG CAPS Take by mouth.     magnesium hydroxide (MILK OF MAGNESIA) 400 MG/5ML suspension Take by mouth.     Milk Thistle Extract 175 MG TABS Take by mouth.     Multiple Vitamins-Minerals (MULTIVITAL) tablet Take 1 tablet by mouth daily.     NON FORMULARY Place under the tongue. [place] Flower Essence     pantoprazole (PROTONIX) 40 MG tablet Take 1 tablet (40 mg total) by mouth daily. 90 tablet 2   potassium gluconate 595 MG TABS tablet Take 595 mg by mouth daily.     Prenatal Vit-Fe Fumarate-FA (PRENATAL VITAMIN PO) Take by mouth.     Selenium (SELENIMIN PO) Take by mouth daily.     No current facility-administered medications on file prior to visit.     Past Medical History:  Diagnosis Date   Anxiety    Arthritis    Bicuspid aortic valve    Depression    Hypertension    Hypothyroidism    Polio    Thyroid disease    Allergies  Allergen Reactions   Clindamycin/Lincomycin Diarrhea and Other (See  Comments)    C-Diff   Fluticasone     Headache and nosebleed    Azithromycin    Eggs Or Egg-Derived Products    Milk-Related Compounds     Unknown reaction   Penicillins Rash   Sulfa Antibiotics Diarrhea   Sulfamethoxazole-Trimethoprim    Fenofibrate Rash    Social History   Socioeconomic History   Marital status: Divorced    Spouse name: Not on file   Number of children: 2   Years of education: Not on file   Highest education level: Not on file  Occupational History    Employer: [employer]  Tobacco Use   Smoking status: Never Smoker   Smokeless tobacco: Never Used   Tobacco comment: Rare smoker in the distant  past.   Substance and Sexual Activity   Alcohol use: Yes    Alcohol/week: 0.0 standard drinks    Comment: wine   Drug use: No   Sexual activity: Not on file  Other Topics Concern   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    Social Determinants of Health   Financial Resource Strain:    Difficulty of Paying Living Expenses:   Food Insecurity:    Worried About Charity fundraiser in the Last Year:    Arboriculturist in the Last Year:   Transportation Needs:    Film/video editor (Medical):    Lack of Transportation (Non-Medical):   Physical Activity:    Days of Exercise per Week:    Minutes of Exercise per Session:   Stress:    Feeling of Stress :   Social Connections:    Frequency of Communication with Friends and Family:    Frequency of Social Gatherings with Friends and Family:    Attends Religious Services:    Active Member of Clubs or Organizations:    Attends Archivist Meetings:    Marital Status:     Vitals:   04/11/20 1417  BP: 136/70  Pulse: 92  Resp: 16  Temp: 98.4 F (36.9 C)  SpO2: 98%   Body mass index is 23.77 kg/m.  Physical Exam Vitals and nursing note reviewed.  Constitutional:      General: She is not in acute distress.    Appearance: She is well-developed.  HENT:     Head: Normocephalic and atraumatic.  Eyes:     Conjunctiva/sclera: Conjunctivae normal.  Cardiovascular:     Rate and Rhythm: Normal rate and regular rhythm.     Pulses:          Dorsalis pedis pulses are 2+ on the left side.     Heart sounds: No murmur heard.      Comments: Right DP pulse present. Pulmonary:     Effort: Pulmonary effort is normal. No respiratory distress.     Breath sounds: Normal breath sounds.  Abdominal:     Palpations: Abdomen is soft.     Tenderness: There is no abdominal tenderness.  Musculoskeletal:     Comments: RLE brace, muscle atrophy s/p polio.   Lymphadenopathy:     Cervical: No cervical adenopathy.  Skin:    General: Skin is warm.     Findings: No erythema or rash.  Neurological:     Mental Status: She is alert and oriented to person, place, and time.     Cranial Nerves: No cranial nerve deficit.     Comments: Unstable,antalgic gait, not assisted.  Psychiatric:        Mood and Affect: Affect normal. Mood is anxious.     Comments: Well groomed, good eye contact.     ASSESSMENT AND PLAN:   Ms. Ammarie Matsuura was seen today for follow-up.  No orders of the defined types were placed in this encounter.  PAD (peripheral artery disease) ([place]) She prefers to hold on vascular evaluation and intensive antiplatelet therapy for now. She agrees with Aspirin 81 mg daily and Crestor 5 mg daily. Appropriate skin care.  Hyperlipidemia After discussion of some side effects as well as benefits of stain meds,she agrees with trying Crestor 5 mg. Continue low fat diet.  Insomnia Trazodone did not help. Lorazepam dose increased from 0.5 mg to 1 mg daily. Good sleep hygiene. Side effects discussed.  Generalized osteoarthritis of multiple sites Cymbalta helped but caused insomnia. Recommend considering trying medication again in a few weeks. Tylenol 500 mg 3-4 tabs daily if needed.   Return in about 4 months (around 08/11/2020) for fasting labs, HLD,sleep.  Aryelle Figg G. [place], MD  [facility]. [place] office.   A few things to remember from today's visit:  Lorazepam increased to 1 mg tab to take at bedtime as needed. If possible try Cymbalta again in a few weeks. Fall precautions. Good skin care. Aspirin 81 mg daily for leg circulation. Crestor 5 mg daily.   If you need refills please call your pharmacy. Do not use My Chart to request refills or for acute issues that need immediate attention.    Please be sure medication list is accurate. If a new problem present, please set up appointment sooner than  planned today.

## 2020-04-11 NOTE — Assessment & Plan Note (Addendum)
After discussion of some side effects as well as benefits of stain meds,she agrees with trying Crestor 5 mg. Continue low fat diet.

## 2020-04-11 NOTE — Assessment & Plan Note (Signed)
She prefers to hold on vascular evaluation and intensive antiplatelet therapy for now. She agrees with Aspirin 81 mg daily and Crestor 5 mg daily. Appropriate skin care.

## 2020-04-11 NOTE — Assessment & Plan Note (Addendum)
Trazodone did not help. Lorazepam dose increased from 0.5 mg to 1 mg daily. Good sleep hygiene. Side effects discussed.

## 2020-04-11 NOTE — Patient Instructions (Signed)
A few things to remember from today's visit:  Lorazepam increased to 1 mg tab to take at bedtime as needed. If possible try Cymbalta again in a few weeks. Fall precautions. Good skin care. Aspirin 81 mg daily for leg circulation. Crestor 5 mg daily.   If you need refills please call your pharmacy. Do not use My Chart to request refills or for acute issues that need immediate attention.    Please be sure medication list is accurate. If a new problem present, please set up appointment sooner than planned today.

## 2020-04-13 ENCOUNTER — Encounter: Payer: Self-pay | Admitting: Family Medicine

## 2020-04-18 ENCOUNTER — Telehealth (INDEPENDENT_AMBULATORY_CARE_PROVIDER_SITE_OTHER): Payer: Medicare Other | Admitting: Family Medicine

## 2020-04-18 ENCOUNTER — Encounter: Payer: Self-pay | Admitting: Family Medicine

## 2020-04-18 VITALS — Temp 99.2°F | Wt 142.6 lb

## 2020-04-18 DIAGNOSIS — R0981 Nasal congestion: Secondary | ICD-10-CM | POA: Diagnosis not present

## 2020-04-18 DIAGNOSIS — R519 Headache, unspecified: Secondary | ICD-10-CM

## 2020-04-18 MED ORDER — DOXYCYCLINE HYCLATE 100 MG PO TABS: 100.0000 mg | ORAL_TABLET | Freq: Two times a day (BID) | ORAL | 0 refills | Status: DC

## 2020-04-18 NOTE — Progress Notes (Signed)
Virtual Visit via Video Note  I connected with Crystal Levy  on 04/18/20 at  1:20 PM EDT by a video enabled telemedicine application and verified that I am speaking with the correct person using two identifiers.  Location patient: home, [name] Location provider:work or home office Persons participating in the virtual visit: patient, provider  I discussed the limitations of evaluation and management by telemedicine and the availability of in person appointments. The patient expressed understanding and agreed to proceed.   HPI:  Acute visit for sinus issues: -symptoms include low grade temp of 99 and scratchy throat for 3 days, sinus congestion for 2 weeks and now recently some L maxillarysinus pressure and discomfort -she had a rapid antigen test and a PCR test done yesterday - both negative -denies cough, body aches, malaise, SOB, , loss of taste or smell -has allergies -not taking anything for allergies -fully vaccinated for COVID19 -her grandson was sick with similar symptoms right before her symptoms started - fully vaccinated   ROS: See pertinent positives and negatives per HPI.  Past Medical History:  Diagnosis Date  . Anxiety   . Arthritis   . Bicuspid aortic valve   . Depression   . Hypertension   . Hypothyroidism   . Polio   . Thyroid disease     Past Surgical History:  Procedure Laterality Date  . ABDOMINAL HYSTERECTOMY    . BREAST ENHANCEMENT SURGERY    . BREAST SURGERY    . BUNIONECTOMY    . CARDIAC CATHETERIZATION    . EYE SURGERY    . HAMMER TOE SURGERY    . HERNIA REPAIR    . JOINT REPLACEMENT     left hip  . KNEE SURGERY     meniscus  . RADICAL VAGINAL HYSTERECTOMY  1993  . TONSILLECTOMY    . TOTAL KNEE ARTHROPLASTY Left 12/20/2014   Procedure: LEFT TOTAL KNEE ARTHROPLASTY;  Surgeon: Crystal Nakayama, MD;  Location: [place];  Service: Orthopedics;  Laterality: Left;    Family History  Problem Relation Age of Onset  . CAD Mother 51  . Aortic stenosis Mother      SOCIAL HX: see hpi   Current Outpatient Medications:  .  Alpha-Lipoic Acid 100 MG TABS, Take by mouth., Disp: , Rfl:  .  b complex vitamins tablet, Take 1 tablet by mouth daily., Disp: , Rfl:  .  benazepril (LOTENSIN) 10 MG tablet, Take 1 tablet (10 mg total) by mouth daily., Disp: 90 tablet, Rfl: 2 .  Biotin 1000 MCG tablet, Take 1,000 mcg by mouth daily., Disp: , Rfl:  .  calcium carbonate (OSCAL) 1500 (600 Ca) MG TABS tablet, Take by mouth., Disp: , Rfl:  .  Cholecalciferol (VITAMIN D-3) 1000 UNITS CAPS, Take 2 capsules by mouth daily., Disp: , Rfl:  .  co-enzyme Q-10 50 MG capsule, Take 50 mg by mouth daily., Disp: , Rfl:  .  DIGESTIVE ENZYMES PO, Take by mouth., Disp: , Rfl:  .  levothyroxine (SYNTHROID) 88 MCG tablet, Take 1 tablet (88 mcg total) by mouth daily before breakfast., Disp: 90 tablet, Rfl: 2 .  LORazepam (ATIVAN) 1 MG tablet, Take 1 tablet (1 mg total) by mouth at bedtime as needed for anxiety., Disp: 30 tablet, Rfl: 2 .  Magnesium 100 MG CAPS, Take by mouth., Disp: , Rfl:  .  magnesium hydroxide (MILK OF MAGNESIA) 400 MG/5ML suspension, Take by mouth., Disp: , Rfl:  .  Milk Thistle Extract 175 MG TABS, Take by mouth., Disp: ,  Rfl:  .  Multiple Vitamins-Minerals (MULTIVITAL) tablet, Take 1 tablet by mouth daily., Disp: , Rfl:  .  NON FORMULARY, Place under the tongue. [place] Flower Essence, Disp: , Rfl:  .  pantoprazole (PROTONIX) 40 MG tablet, Take 1 tablet (40 mg total) by mouth daily., Disp: 90 tablet, Rfl: 2 .  potassium gluconate 595 MG TABS tablet, Take 595 mg by mouth daily., Disp: , Rfl:  .  Prenatal Vit-Fe Fumarate-FA (PRENATAL VITAMIN PO), Take by mouth., Disp: , Rfl:  .  rosuvastatin (CRESTOR) 5 MG tablet, Take 1 tablet (5 mg total) by mouth daily., Disp: 90 tablet, Rfl: 1 .  Selenium (SELENIMIN PO), Take by mouth daily., Disp: , Rfl:  .  doxycycline (VIBRA-TABS) 100 MG tablet, Take 1 tablet (100 mg total) by mouth 2 (two) times daily., Disp: 14 tablet,  Rfl: 0  EXAM:  VITALS per patient if applicable:  GENERAL: alert, oriented, appears well and in no acute distress  HEENT: atraumatic, conjunttiva clear, no obvious abnormalities on inspection of external nose and ears, points ot L max sinus area as area of sinus discomfort  NECK: normal movements of the head and neck  LUNGS: on inspection no signs of respiratory distress, breathing rate appears normal, no obvious gross SOB, gasping or wheezing  CV: no obvious cyanosis  MS: moves all visible extremities without noticeable abnormality  PSYCH/NEURO: pleasant and cooperative, no obvious depression or anxiety, speech and thought processing grossly intact  ASSESSMENT AND PLAN:  Discussed the following assessment and plan:  Sinus congestion  Facial discomfort  -we discussed possible serious and likely etiologies, options for evaluation and workup, limitations of telemedicine visit vs in person visit, treatment, treatment risks and precautions. Pt prefers to treat via telemedicine empirically rather then risking or undertaking an in person visit at this moment. Query developing sinusitis 2ndary to chronic nasal congestion vs other. She had negative COVID19 testing. Opted for trial nasal saline and doxy 100mg  bid x 7 days if worsening or not improving over the next few days. Advised staying home while sick.  Patient agrees to seek prompt in person care if worsening, new symptoms arise, or if is not improving with treatment.   I discussed the assessment and treatment plan with the patient. The patient was provided an opportunity to ask questions and all were answered. The patient agreed with the plan and demonstrated an understanding of the instructions.   The patient was advised to call back or seek an in-person evaluation if the symptoms worsen or if the condition fails to improve as anticipated.   Crystal Kern, DO

## 2020-04-18 NOTE — Patient Instructions (Signed)
-  I sent the medication(s) we discussed to your pharmacy: Meds ordered this encounter  Medications  . doxycycline (VIBRA-TABS) 100 MG tablet    Sig: Take 1 tablet (100 mg total) by mouth 2 (two) times daily.    Dispense:  14 tablet    Refill:  0    Please let us know if you have any questions or concerns regarding this prescription.  I hope you are feeling better soon! Seek care promptly if your symptoms worsen, new concerns arise or you are not improving with treatment.

## 2020-04-24 ENCOUNTER — Other Ambulatory Visit: Payer: Self-pay | Admitting: Family Medicine

## 2020-05-03 ENCOUNTER — Ambulatory Visit: Payer: Medicare Other | Admitting: Family Medicine

## 2020-05-22 ENCOUNTER — Other Ambulatory Visit: Payer: Self-pay

## 2020-05-22 ENCOUNTER — Telehealth (INDEPENDENT_AMBULATORY_CARE_PROVIDER_SITE_OTHER): Payer: Medicare Other | Admitting: Internal Medicine

## 2020-05-22 ENCOUNTER — Encounter: Payer: Self-pay | Admitting: Internal Medicine

## 2020-05-22 VITALS — Temp 98.2°F | Ht 66.0 in | Wt 145.0 lb

## 2020-05-22 DIAGNOSIS — J029 Acute pharyngitis, unspecified: Secondary | ICD-10-CM

## 2020-05-22 DIAGNOSIS — H9201 Otalgia, right ear: Secondary | ICD-10-CM

## 2020-05-22 MED ORDER — CEFDINIR 300 MG PO CAPS: 300.0000 mg | ORAL_CAPSULE | Freq: Two times a day (BID) | ORAL | 0 refills | Status: DC

## 2020-05-22 NOTE — Progress Notes (Signed)
Virtual Visit via Video Note  I connected with@ on 05/22/20 at  2:00 PM EDT by a video enabled telemedicine application and verified that I am speaking with the correct person using two identifiers. Location patient: home Location provider:work  office Persons participating in the virtual visit: patient, provider  [place] national recommendations  regarding COVID 19 pandemic   video visit is advised over in office visit for this patient.  Patient aware  of the limitations of evaluation and management by telemedicine and  availability of in person appointments. and agreed to proceed.   HPI: Crystal Levy presents for video visit  sda PCP na today   3+ days of r ear pain and   Pain right side of jaw hurts   to swallow and also  chew      Pain awoken her from sleep   Last month had left sided   Sinus like sx rx with doxycycline and was better  Has been  Tested for covid in past   Noknown  exposures and is vaccinated   No    ROS: See pertinent positives and negatives per HPI. Liver a lone  "not sick otherwises Past Medical History:  Diagnosis Date  . Anxiety   . Arthritis   . Bicuspid aortic valve   . Depression   . Hypertension   . Hypothyroidism   . Polio   . Thyroid disease     Past Surgical History:  Procedure Laterality Date  . ABDOMINAL HYSTERECTOMY    . BREAST ENHANCEMENT SURGERY    . BREAST SURGERY    . BUNIONECTOMY    . CARDIAC CATHETERIZATION    . EYE SURGERY    . HAMMER TOE SURGERY    . HERNIA REPAIR    . JOINT REPLACEMENT     left hip  . KNEE SURGERY     meniscus  . RADICAL VAGINAL HYSTERECTOMY  1993  . TONSILLECTOMY    . TOTAL KNEE ARTHROPLASTY Left 12/20/2014   Procedure: LEFT TOTAL KNEE ARTHROPLASTY;  Surgeon: Melrose Nakayama, MD;  Location: [place];  Service: Orthopedics;  Laterality: Left;    Family History  Problem Relation Age of Onset  . CAD Mother 46  . Aortic stenosis Mother     Social History   Tobacco Use  . Smoking status: Never Smoker   . Smokeless tobacco: Never Used  . Tobacco comment: Rare smoker in the distant past.   Substance Use Topics  . Alcohol use: Yes    Alcohol/week: 0.0 standard drinks    Comment: wine  . Drug use: No      Current Outpatient Medications:  .  Alpha-Lipoic Acid 100 MG TABS, Take by mouth., Disp: , Rfl:  .  b complex vitamins tablet, Take 1 tablet by mouth daily., Disp: , Rfl:  .  benazepril (LOTENSIN) 10 MG tablet, TAKE 1 TABLET BY MOUTH  DAILY, Disp: 90 tablet, Rfl: 3 .  Biotin 1000 MCG tablet, Take 1,000 mcg by mouth daily., Disp: , Rfl:  .  calcium carbonate (OSCAL) 1500 (600 Ca) MG TABS tablet, Take by mouth., Disp: , Rfl:  .  Cholecalciferol (VITAMIN D-3) 1000 UNITS CAPS, Take 2 capsules by mouth daily., Disp: , Rfl:  .  co-enzyme Q-10 50 MG capsule, Take 50 mg by mouth daily., Disp: , Rfl:  .  DIGESTIVE ENZYMES PO, Take by mouth., Disp: , Rfl:  .  levothyroxine (SYNTHROID) 88 MCG tablet, TAKE 1 TABLET BY MOUTH  DAILY BEFORE BREAKFAST, Disp: 90  tablet, Rfl: 3 .  LORazepam (ATIVAN) 1 MG tablet, Take 1 tablet (1 mg total) by mouth at bedtime as needed for anxiety., Disp: 30 tablet, Rfl: 2 .  Magnesium 100 MG CAPS, Take by mouth., Disp: , Rfl:  .  magnesium hydroxide (MILK OF MAGNESIA) 400 MG/5ML suspension, Take by mouth., Disp: , Rfl:  .  Milk Thistle Extract 175 MG TABS, Take by mouth., Disp: , Rfl:  .  Multiple Vitamins-Minerals (MULTIVITAL) tablet, Take 1 tablet by mouth daily., Disp: , Rfl:  .  NON FORMULARY, Place under the tongue. [place] Flower Essence, Disp: , Rfl:  .  pantoprazole (PROTONIX) 40 MG tablet, TAKE 1 TABLET BY MOUTH  DAILY, Disp: 90 tablet, Rfl: 3 .  potassium gluconate 595 MG TABS tablet, Take 595 mg by mouth daily., Disp: , Rfl:  .  Prenatal Vit-Fe Fumarate-FA (PRENATAL VITAMIN PO), Take by mouth., Disp: , Rfl:  .  rosuvastatin (CRESTOR) 5 MG tablet, Take 1 tablet (5 mg total) by mouth daily., Disp: 90 tablet, Rfl: 1 .  Selenium (SELENIMIN PO), Take by mouth  daily., Disp: , Rfl:  .  cefdinir (OMNICEF) 300 MG capsule, Take 1 capsule (300 mg total) by mouth 2 (two) times daily., Disp: 10 capsule, Rfl: 0 .  doxycycline (VIBRA-TABS) 100 MG tablet, Take 1 tablet (100 mg total) by mouth 2 (two) times daily. (Patient not taking: Reported on 05/22/2020), Disp: 14 tablet, Rfl: 0  EXAM: BP Readings from Last 3 Encounters:  04/11/20 136/70  03/21/20 110/70  02/01/20 120/60    VITALS per patient if applicable:  GENERAL: alert, oriented, appears well and in no acute distress  HEENT: atraumatic, conjunttiva clear, no obvious abnormalities on inspection of external nose and ears Points to raight ear and  Under jaw neck to swallow no swelling   Cough   NECK: normal movements of the head and neck  LUNGS: on inspection no signs of respiratory distress, breathing rate appears normal, no obvious gross SOB, gasping or wheezing  CV: no obvious cyanosis  MS: moves all visible extremities without noticeable abnormality  PSYCH/NEURO: pleasant and cooperative, no obvious depression or anxiety, speech and thought processing grossly intact Lab Results  Component Value Date   WBC 6.4 12/09/2014   HGB 13.9 12/09/2014   HCT 41.9 12/09/2014   PLT 298 12/09/2014   GLUCOSE 104 (H) 02/01/2020   NA 141 02/01/2020   K 4.0 02/01/2020   CL 103 02/01/2020   CREATININE 0.52 02/01/2020   BUN 12 02/01/2020   CO2 29 02/01/2020   TSH 0.50 02/01/2020   INR 0.94 12/09/2014    ASSESSMENT AND PLAN:  Discussed the following assessment and plan:    ICD-10-CM   1. Right ear pain  H92.01   2. Acute sore throat right   J02.9    r sided hurts to swallow and chew   Some s c/w outer ear issues or  Jaw other with swallowing   Middle ear or sinus and throat  Last  rx august left side did better with antibiotic    Has been tested a number of time for covid and negative and has been immunized  No exposures noted  reviewed  August visit and allergy   Counseled.  Sx rx tylenol  can add  omnicef x 5 days doubt if will get cross reaction  If  persistent or progressive will need  In person visit .   Is utd on dental needs and no recent problems that would contribute  to sx today   Expectant management and discussion of plan and treatment with opportunity to ask questions and all were answered. The patient agreed with the plan and demonstrated an understanding of the instructions.  if not getting better may need in person visit to check ear  And or ent  Advised to call back or seek an in-person evaluation if worsening  or having  further concerns . Return if symptoms worsen or fail to improve as expected.    Shanon Ace, MD

## 2020-06-01 DIAGNOSIS — L578 Other skin changes due to chronic exposure to nonionizing radiation: Secondary | ICD-10-CM | POA: Diagnosis not present

## 2020-06-01 DIAGNOSIS — L819 Disorder of pigmentation, unspecified: Secondary | ICD-10-CM | POA: Diagnosis not present

## 2020-06-01 DIAGNOSIS — L814 Other melanin hyperpigmentation: Secondary | ICD-10-CM | POA: Diagnosis not present

## 2020-06-01 DIAGNOSIS — L728 Other follicular cysts of the skin and subcutaneous tissue: Secondary | ICD-10-CM | POA: Diagnosis not present

## 2020-06-01 DIAGNOSIS — D1801 Hemangioma of skin and subcutaneous tissue: Secondary | ICD-10-CM | POA: Diagnosis not present

## 2020-06-19 ENCOUNTER — Other Ambulatory Visit: Payer: Self-pay

## 2020-06-19 MED ORDER — ROSUVASTATIN CALCIUM 5 MG PO TABS: 5.0000 mg | ORAL_TABLET | Freq: Every day | ORAL | 1 refills | Status: DC

## 2020-06-20 ENCOUNTER — Other Ambulatory Visit: Payer: Self-pay

## 2020-06-21 ENCOUNTER — Ambulatory Visit (INDEPENDENT_AMBULATORY_CARE_PROVIDER_SITE_OTHER): Payer: Medicare Other | Admitting: Family Medicine

## 2020-06-21 ENCOUNTER — Encounter: Payer: Self-pay | Admitting: Family Medicine

## 2020-06-21 VITALS — BP 106/60 | HR 74 | Resp 16 | Ht 66.0 in | Wt 147.4 lb

## 2020-06-21 DIAGNOSIS — R002 Palpitations: Secondary | ICD-10-CM

## 2020-06-21 DIAGNOSIS — F419 Anxiety disorder, unspecified: Secondary | ICD-10-CM

## 2020-06-21 DIAGNOSIS — I739 Peripheral vascular disease, unspecified: Secondary | ICD-10-CM | POA: Diagnosis not present

## 2020-06-21 DIAGNOSIS — R062 Wheezing: Secondary | ICD-10-CM | POA: Diagnosis not present

## 2020-06-21 MED ORDER — CITALOPRAM HYDROBROMIDE 10 MG PO TABS: 10.0000 mg | ORAL_TABLET | Freq: Every day | ORAL | 2 refills | Status: DC

## 2020-06-21 MED ORDER — LORAZEPAM 1 MG PO TABS: 1.0000 mg | ORAL_TABLET | Freq: Every evening | ORAL | 3 refills | Status: DC | PRN

## 2020-06-21 NOTE — Progress Notes (Signed)
HPI: [name] is a 79 y.o. female, who is here today for follow up.   She was last seen on 04/11/20  She has a few concerns and questions today.  States that she is now exercising regularly. Stretching,walking,2-3 Lb wt daily. Still following a healthful diet.  PAD: She is concerned about taking Aspirin. Hx of PUD. She also brings a recent article about aspirin and primary prevention.  Abdominal CTA 05/24/11: 1. Relatively small caliber right common femoral artery. Occluded right superficial femoral artery. Reconstitution of the proximal right popliteal artery which is also relatively small caliber and there is an approximately 30 percent proximal stenosis. Two vessel runoff below the right knee.  2. Two vessel runoff below the left knee with severe distal attenuation of the left posterior tibial artery.  3. Severe atrophy right lower extremity musculature. Probable hemangioma right lobe of the liver.   Cardiac cath on 06/15/2010: LVEF 65%. 1. Mild nonobstructive coronary artery disease.  2.Preserved left ventricular function.  3.No evidence of aortic stenosis.  4.Right radial access.  5.Recommend continued medical management.   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 She has questions about annual infusion for OA  her cousin told her about. She takes infusion annually. Cervical,knees, IP pain. No edema or erythema.  [facility]. She is on vit D supplementation. She is not on ca++ supplementation.  Heart "racing" for a "little bit", intermittently for at least a year. She thinks it may be caused by anxiety. Her causin suggested that symptoms could be heart related.  Palpitation is at rest, not related with exertion. She checks pulse and sometimes "a little" extra bit.  This happens very seldom. She has had some cardiac work up a few years ago.  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 SOB does not happen at the same time with palpitations.  Upper check wheezing for about a couple months.She has had same problem in left side for years. No associated cough. No hx of tobacco use.  This happens at nights. No frequent heartburn since being on PPI.  A few days ago she felt anxiety "all over me." She was choping vegetables to make a salad, felt like "exploiting" in anxiety, shaking for about 3 min. No associated headache, She has not had anxiety "this bad." No recent event. She has been on SSRI's in the past. She is on Lorazepam 0.25 mg bid prn.  Review of Systems  Constitutional: Positive for fatigue. Negative for activity change, appetite change and fever.  HENT: Negative for mouth sores, nosebleeds and sore throat.   Gastrointestinal: Negative for abdominal pain, nausea and vomiting.       Negative for changes in bowel habits.  Genitourinary: Negative for decreased urine volume, dysuria and hematuria.  Musculoskeletal: Positive for gait problem.  Skin: Negative for pallor and rash.  Neurological: Negative for syncope, facial asymmetry and weakness.  Psychiatric/Behavioral: Positive for sleep disturbance. Negative for confusion. The patient is nervous/anxious.   Rest of ROS, see pertinent positives sand negatives in HPI  Current Outpatient Medications on File Prior to Visit  Medication Sig Dispense Refill  . Alpha-Lipoic Acid 100 MG TABS Take by mouth.    Marland Kitchen b complex vitamins tablet Take 1 tablet by mouth daily.    . benazepril (LOTENSIN) 10 MG tablet TAKE 1 TABLET  BY MOUTH  DAILY 90 tablet 3  . Biotin 1000 MCG tablet Take 1,000 mcg by mouth daily.    . calcium carbonate (OSCAL) 1500 (600 Ca) MG TABS tablet Take by mouth.    . Cholecalciferol (VITAMIN D-3) 1000 UNITS CAPS Take 2 capsules by mouth daily.    Marland Kitchen co-enzyme Q-10 50 MG capsule Take 50 mg by mouth daily.    Marland Kitchen DIGESTIVE  ENZYMES PO Take by mouth.    . levothyroxine (SYNTHROID) 88 MCG tablet TAKE 1 TABLET BY MOUTH  DAILY BEFORE BREAKFAST 90 tablet 3  . Magnesium 100 MG CAPS Take by mouth.    . magnesium hydroxide (MILK OF MAGNESIA) 400 MG/5ML suspension Take by mouth.    . Milk Thistle Extract 175 MG TABS Take by mouth.    . Multiple Vitamins-Minerals (MULTIVITAL) tablet Take 1 tablet by mouth daily.    . NON FORMULARY Place under the tongue. [place] Flower Essence    . pantoprazole (PROTONIX) 40 MG tablet TAKE 1 TABLET BY MOUTH  DAILY 90 tablet 3  . potassium gluconate 595 MG TABS tablet Take 595 mg by mouth daily.    . Prenatal Vit-Fe Fumarate-FA (PRENATAL VITAMIN PO) Take by mouth.    . rosuvastatin (CRESTOR) 5 MG tablet Take 1 tablet (5 mg total) by mouth daily. 90 tablet 1  . Selenium (SELENIMIN PO) Take by mouth daily.     No current facility-administered medications on file prior to visit.   Past Medical History:  Diagnosis Date  . Anxiety   . Arthritis   . Bicuspid aortic valve   . Depression   . Hypertension   . Hypothyroidism   . Polio   . Thyroid disease    Allergies  Allergen Reactions  . Clindamycin/Lincomycin Diarrhea and Other (See Comments)    C-Diff  . Fluticasone     Headache and nosebleed   . Azithromycin   . Eggs Or Egg-Derived Products   . Milk-Related Compounds     Unknown reaction  . Penicillins Rash  . Sulfa Antibiotics Diarrhea  . Sulfamethoxazole-Trimethoprim   . Fenofibrate Rash    Social History   Socioeconomic History  . Marital status: Divorced    Spouse name: Not on file  . Number of children: 2  . Years of education: Not on file  . Highest education level: Not on file  Occupational History    Employer: [employer]  Tobacco Use  . Smoking status: Never Smoker  . Smokeless tobacco: Never Used  . Tobacco comment: Rare smoker in the distant past.   Substance and Sexual Activity  . Alcohol use: Yes    Alcohol/week: 0.0 standard drinks      Comment: wine  . Drug use: No  . Sexual activity: Not on file  Other Topics Concern  . 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    Social Determinants of Health   Financial Resource Strain:   . Difficulty of Paying Living Expenses: Not on file  Food Insecurity:   . Worried About Charity fundraiser in the Last Year: Not on file  . Ran Out of Food in the Last Year: Not on file  Transportation Needs:   . Lack of Transportation (Medical): Not on file  . Lack of Transportation (Non-Medical): Not on file  Physical Activity:   . Days of Exercise per Week: Not on file  . Minutes of Exercise per Session: Not on file  Stress:   . Feeling of Stress : Not on file  Social Connections:   . Frequency of Communication with Friends and Family: Not on file  . Frequency of Social Gatherings with Friends and Family: Not on file  . Attends Religious Services: Not on file  . Active Member of Clubs or Organizations: Not on file  . Attends Archivist Meetings: Not on file  . Marital Status: Not on file   Vitals:   06/21/20 1051  BP: 106/60  Pulse: 74  Resp: 16  SpO2: 96%   Body mass index is 23.79 kg/m.  Physical Exam Vitals and nursing note reviewed.  Constitutional:      General: She is not in acute distress.    Appearance: She is well-developed.  HENT:     Head: Normocephalic and atraumatic.     Mouth/Throat:     Mouth: Mucous membranes are moist.     Pharynx: Oropharynx is clear.  Eyes:     Conjunctiva/sclera: Conjunctivae normal.     Pupils: Pupils are equal, round, and reactive to light.  Cardiovascular:     Rate and Rhythm: Normal rate and regular rhythm.     Heart sounds: No murmur heard.      Comments: DP pulses present , bilateral. Pulmonary:     Effort: Pulmonary effort is normal. No respiratory distress.     Breath sounds: Normal breath sounds.  Abdominal:     Palpations: Abdomen is soft. There is no  hepatomegaly or mass.     Tenderness: There is no abdominal tenderness.  Lymphadenopathy:     Cervical: No cervical adenopathy.  Skin:    General: Skin is warm.     Findings: No erythema or rash.  Neurological:     Mental Status: She is alert and oriented to person, place, and time.     Cranial Nerves: No cranial nerve deficit.     Comments: Mildly unstable gait, not assisted.  Psychiatric:        Mood and Affect: Affect normal. Mood is anxious.     Comments: Well groomed, good eye contact.   ASSESSMENT AND PLAN:  [name] was seen today for follow-up.  Orders Placed This Encounter  Procedures  . DG Chest 2 View  . EKG 12-Lead    Heart palpitations Chronic. We discussed possible etiologies. She has had cardiac work up done a few years ago. Hx and examination today does not suggest a serious process. EKG today: SR, normal axis and intervals.  Wheezing Lung auscultation normal today. CXR ordered, she prefers to hold to have it done here in office next visit. Today she will have to go to [place] about warning signs.  PAD (peripheral artery disease) (HCC) Aspirin side effects discussed. So far she has not had GI symptoms, EC Aspirin may be better tolerated. Continue Crestor 5 mg daly.  Anxiety disorder, unspecified type This could be aggravating above problems. She agrees with resuming Celexa, low dsoe. No changes in Lorazepam. Some side effects discussed.  -     citalopram (CELEXA) 10 MG tablet; Take 1 tablet (10 mg total) by mouth daily. -     LORazepam (ATIVAN) 1 MG tablet; Take 1 tablet (1 mg total) by mouth at bedtime as needed for anxiety.    Return if symptoms worsen or fail to improve, for Keep next appt.   Mardie Kellen G. [place], MD  [facility]. [place] office.   A few things to remember from today's  visit:  Low diose aspirin EC I think is safe, monitor for side effects. Continue Crestor 5 mg and will check cholesterol  next visit. Lung auscultation today was negative. If wheezing gets worse or you start having cough or shortness of breath associated with it, we will need to consider further studies and/or pulmonology referral. Palpitations do not sound worrisome. For now I am feeling we can continue following and monitor after restarting new medication today for anxiety, Celexa 10 mg daily. If any chest pain or associated shortness of breath, feeling clammy we will need to consider cardiology evaluation.  Please keep next appointment, planning on doing fasting labs.  If you need refills please call your pharmacy. Do not use My Chart to request refills or for acute issues that need immediate attention.    Please be sure medication list is accurate. If a new problem present, please set up appointment sooner than planned today.

## 2020-06-21 NOTE — Patient Instructions (Addendum)
A few things to remember from today's visit:  Low diose aspirin EC I think is safe, monitor for side effects. Continue Crestor 5 mg and will check cholesterol next visit. Lung auscultation today was negative. If wheezing gets worse or you start having cough or shortness of breath associated with it, we will need to consider further studies and/or pulmonology referral. Palpitations do not sound worrisome. For now I am feeling we can continue following and monitor after restarting new medication today for anxiety, Celexa 10 mg daily. If any chest pain or associated shortness of breath, feeling clammy we will need to consider cardiology evaluation.  Please keep next appointment, planning on doing fasting labs.  If you need refills please call your pharmacy. Do not use My Chart to request refills or for acute issues that need immediate attention.    Please be sure medication list is accurate. If a new problem present, please set up appointment sooner than planned today.

## 2020-06-24 ENCOUNTER — Encounter: Payer: Self-pay | Admitting: Family Medicine

## 2020-07-12 DIAGNOSIS — K5900 Constipation, unspecified: Secondary | ICD-10-CM | POA: Diagnosis not present

## 2020-07-12 DIAGNOSIS — K219 Gastro-esophageal reflux disease without esophagitis: Secondary | ICD-10-CM | POA: Diagnosis not present

## 2020-08-11 ENCOUNTER — Other Ambulatory Visit: Payer: Self-pay

## 2020-08-11 ENCOUNTER — Encounter: Payer: Self-pay | Admitting: Family Medicine

## 2020-08-11 ENCOUNTER — Ambulatory Visit (INDEPENDENT_AMBULATORY_CARE_PROVIDER_SITE_OTHER): Payer: Medicare Other | Admitting: Family Medicine

## 2020-08-11 VITALS — BP 120/60 | HR 76 | Resp 16 | Ht 66.0 in | Wt 148.0 lb

## 2020-08-11 DIAGNOSIS — Z0001 Encounter for general adult medical examination with abnormal findings: Secondary | ICD-10-CM | POA: Diagnosis not present

## 2020-08-11 DIAGNOSIS — R296 Repeated falls: Secondary | ICD-10-CM

## 2020-08-11 DIAGNOSIS — I10 Essential (primary) hypertension: Secondary | ICD-10-CM

## 2020-08-11 DIAGNOSIS — F419 Anxiety disorder, unspecified: Secondary | ICD-10-CM | POA: Diagnosis not present

## 2020-08-11 DIAGNOSIS — E785 Hyperlipidemia, unspecified: Secondary | ICD-10-CM | POA: Diagnosis not present

## 2020-08-11 DIAGNOSIS — Z780 Asymptomatic menopausal state: Secondary | ICD-10-CM

## 2020-08-11 DIAGNOSIS — Z1159 Encounter for screening for other viral diseases: Secondary | ICD-10-CM | POA: Diagnosis not present

## 2020-08-11 MED ORDER — CITALOPRAM HYDROBROMIDE 20 MG PO TABS: 20.0000 mg | ORAL_TABLET | Freq: Every day | ORAL | 0 refills | Status: DC

## 2020-08-11 NOTE — Assessment & Plan Note (Signed)
Problem improved with Celexa 10 mg but not significant, so celexa dose increased to 20 mg. No changes in Lorazepam dose. Some side effects of meds discussed.

## 2020-08-11 NOTE — Assessment & Plan Note (Signed)
BP adequately controlled. Continue current ,management.

## 2020-08-11 NOTE — Patient Instructions (Addendum)
A few tips:  -As we age balance is not as good as it was, so there is a higher risks for falls. Please remove small rugs and furniture that is "in your way" and could increase the risk of falls. Stretching exercises may help with fall prevention: Yoga and Tai Chi are some examples. Low impact exercise is better, so you are not very achy the next day.  -Sun screen and avoidance of direct sun light recommended. Caution with dehydration, if working outdoors be sure to drink enough fluids.  - Some medications are not safe as we age, increases the risk of side effects and can potentially interact with other medication you are also taken;  including some of over the counter medications. Be sure to let me know when you start a new medication even if it is a dietary/vitamin supplement.   -Healthy diet low in red meet/animal fat and sugar + regular physical activity is recommended.    A few things to remember from today's visit:   Hyperlipidemia, unspecified hyperlipidemia type - Plan: COMPLETE METABOLIC PANEL WITH GFR, Lipid panel  Primary hypertension - Plan: COMPLETE METABOLIC PANEL WITH GFR  Anxiety disorder, unspecified type - Plan: citalopram (CELEXA) 20 MG tablet  Encounter for general adult medical examination with abnormal findings  Asymptomatic postmenopausal estrogen deficiency - Plan: DG Bone Density  If you need refills please call your pharmacy. Do not use My Chart to request refills or for acute issues that need immediate attention.   Celexa increased from 10 mg to 20 mg.  Please be sure medication list is accurate. If a new problem present, please set up appointment sooner than planned today.

## 2020-08-11 NOTE — Progress Notes (Signed)
HPI: Crystal Levy is a 79 y.o. female, who is here today for her routine physical.  Last CPE: > a year ago. Last AWV 03/02/18.  Regular exercise 3 or more time per week: Walks 30 min total a few times per week. Following a healthy diet: Yes. She lives alone. She keeps in touch with ehr son and has a good relation with neighbors.  Chronic medical problems: HTN,anxiety,HLD,OA, hx of polio with right-sided weakness, and PAD among some.  Immunization History  Administered Date(s) Administered  . Influenza, High Dose Seasonal PF 08/31/2018, 06/05/2020  . PFIZER SARS-COV-2 Vaccination 09/10/2019, 10/21/2019, 06/19/2020  . Pneumococcal-Unspecified 09/09/2008  . Tdap 09/09/2009  . Zoster 09/09/2009   Mammogram: 02/24/2017. She does not want to have more mammograms. Colonoscopy: 05/13/19. DEXA: 05/18/15.  Hep C screening: Never.  She has no new concerns today.  She resumed Celexa, she has not had panic attacks sine medication was started. Motivation improved. Still having some anxiety.  She enjoys time with friends.  3 falls since her last visit. No serious injury, ecchymosis on LE's, on side she landed.  Skin tear right hand, healed.  Last fall 07/27/20 outdoors and 2 falls in her living room.  She attends church for holidays shows.   HTN on Benazepril 10 mg daily.  Lab Results  Component Value Date   CREATININE 0.52 02/01/2020   BUN 12 02/01/2020   NA 141 02/01/2020   K 4.0 02/01/2020   CL 103 02/01/2020   CO2 29 02/01/2020   HLD: She is on Crestor 5 mg daily. Tolerating medication well.  Review of Systems  Constitutional: Positive for fatigue. Negative for activity change, appetite change and fever.  HENT: Negative for mouth sores, nosebleeds and sore throat.   Eyes: Negative for redness and visual disturbance.  Respiratory: Negative for cough, shortness of breath and wheezing.   Cardiovascular: Negative for chest pain, palpitations and leg swelling.    Gastrointestinal: Negative for abdominal pain, nausea and vomiting.       Negative for changes in bowel habits.  Endocrine: Negative for cold intolerance, heat intolerance, polydipsia, polyphagia and polyuria.  Genitourinary: Negative for decreased urine volume, dysuria and hematuria.  Musculoskeletal: Positive for arthralgias and gait problem.  Allergic/Immunologic: Positive for environmental allergies.  Neurological: Negative for seizures, syncope, weakness, numbness and headaches.  Psychiatric/Behavioral: Negative for confusion. The patient is nervous/anxious.    Current Outpatient Medications on File Prior to Visit  Medication Sig Dispense Refill  . Alpha-Lipoic Acid 100 MG TABS Take by mouth.    Marland Kitchen b complex vitamins tablet Take 1 tablet by mouth daily.    . benazepril (LOTENSIN) 10 MG tablet TAKE 1 TABLET BY MOUTH  DAILY 90 tablet 3  . Biotin 1000 MCG tablet Take 1,000 mcg by mouth daily.    . calcium carbonate (OSCAL) 1500 (600 Ca) MG TABS tablet Take by mouth.    . Cholecalciferol (VITAMIN D-3) 1000 UNITS CAPS Take 2 capsules by mouth daily.    Marland Kitchen co-enzyme Q-10 50 MG capsule Take 50 mg by mouth daily.    Marland Kitchen DIGESTIVE ENZYMES PO Take by mouth.    . levothyroxine (SYNTHROID) 88 MCG tablet TAKE 1 TABLET BY MOUTH  DAILY BEFORE BREAKFAST 90 tablet 3  . LORazepam (ATIVAN) 1 MG tablet Take 1 tablet (1 mg total) by mouth at bedtime as needed for anxiety. 30 tablet 3  . Magnesium 100 MG CAPS Take by mouth.    . magnesium hydroxide (MILK OF MAGNESIA)  400 MG/5ML suspension Take by mouth.    . Milk Thistle Extract 175 MG TABS Take by mouth.    . Multiple Vitamins-Minerals (MULTIVITAL) tablet Take 1 tablet by mouth daily.    . NON FORMULARY Place under the tongue. [place] Flower Essence    . pantoprazole (PROTONIX) 40 MG tablet TAKE 1 TABLET BY MOUTH  DAILY 90 tablet 3  . potassium gluconate 595 MG TABS tablet Take 595 mg by mouth daily.    . Prenatal Vit-Fe Fumarate-FA (PRENATAL VITAMIN  PO) Take by mouth.    . rosuvastatin (CRESTOR) 5 MG tablet Take 1 tablet (5 mg total) by mouth daily. 90 tablet 1  . Selenium (SELENIMIN PO) Take by mouth daily.     No current facility-administered medications on file prior to visit.   Past Medical History:  Diagnosis Date  . Anxiety   . Arthritis   . Bicuspid aortic valve   . Depression   . Hypertension   . Hypothyroidism   . Polio   . Thyroid disease     Past Surgical History:  Procedure Laterality Date  . ABDOMINAL HYSTERECTOMY    . BREAST ENHANCEMENT SURGERY    . BREAST SURGERY    . BUNIONECTOMY    . CARDIAC CATHETERIZATION    . EYE SURGERY    . HAMMER TOE SURGERY    . HERNIA REPAIR    . JOINT REPLACEMENT     left hip  . KNEE SURGERY     meniscus  . RADICAL VAGINAL HYSTERECTOMY  1993  . TONSILLECTOMY    . TOTAL KNEE ARTHROPLASTY Left 12/20/2014   Procedure: LEFT TOTAL KNEE ARTHROPLASTY;  Surgeon: Melrose Nakayama, MD;  Location: [facility];  Service: Orthopedics;  Laterality: Left;    Allergies  Allergen Reactions  . Clindamycin/Lincomycin Diarrhea and Other (See Comments)    C-Diff  . Fluticasone     Headache and nosebleed   . Azithromycin   . Eggs Or Egg-Derived Products   . Milk-Related Compounds     Unknown reaction  . Penicillins Rash  . Sulfa Antibiotics Diarrhea  . Sulfamethoxazole-Trimethoprim   . Fenofibrate Rash    Family History  Problem Relation Age of Onset  . CAD Mother 8  . Aortic stenosis Mother     Social History   Socioeconomic History  . Marital status: Divorced    Spouse name: Not on file  . Number of children: 2  . Years of education: Not on file  . Highest education level: Not on file  Occupational History    Employer: [employer]  Tobacco Use  . Smoking status: Never Smoker  . Smokeless tobacco: Never Used  . Tobacco comment: Rare smoker in the distant past.   Substance and Sexual Activity  . Alcohol use: Yes    Alcohol/week: 0.0 standard drinks    Comment:  wine  . Drug use: No  . Sexual activity: Not on file  Other Topics Concern  . 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    Social Determinants of Health   Financial Resource Strain:   . Difficulty of Paying Living Expenses: Not on file  Food Insecurity:   . Worried About Charity fundraiser in the Last Year: Not on file  . Ran Out of Food in the Last Year: Not on file  Transportation Needs:   . Lack of Transportation (Medical): Not on file  . Lack of Transportation (Non-Medical): Not on  file  Physical Activity:   . Days of Exercise per Week: Not on file  . Minutes of Exercise per Session: Not on file  Stress:   . Feeling of Stress : Not on file  Social Connections:   . Frequency of Communication with Friends and Family: Not on file  . Frequency of Social Gatherings with Friends and Family: Not on file  . Attends Religious Services: Not on file  . Active Member of Clubs or Organizations: Not on file  . Attends Archivist Meetings: Not on file  . Marital Status: Not on file     Vitals:   08/11/20 0800  BP: 120/60  Pulse: 76  Resp: 16  SpO2: 98%   Body mass index is 23.89 kg/m.  Wt Readings from Last 3 Encounters:  08/11/20 148 lb (67.1 kg)  06/21/20 147 lb 6 oz (66.8 kg)  05/22/20 145 lb (65.8 kg)   Physical Exam Vitals and nursing note reviewed.  Constitutional:      General: She is not in acute distress.    Appearance: She is well-developed and normal weight.  HENT:     Head: Normocephalic and atraumatic.     Right Ear: Hearing, tympanic membrane, ear canal and external ear normal.     Left Ear: Hearing, ear canal and external ear normal.     Mouth/Throat:     Mouth: Mucous membranes are moist.     Pharynx: Oropharynx is clear. Uvula midline.  Eyes:     Extraocular Movements: Extraocular movements intact.     Conjunctiva/sclera: Conjunctivae normal.     Pupils: Pupils are equal, round, and reactive  to light.  Neck:     Thyroid: No thyromegaly.     Trachea: No tracheal deviation.  Cardiovascular:     Rate and Rhythm: Normal rate and regular rhythm.     Heart sounds: No murmur heard.      Comments: DP pulses present. Pulmonary:     Effort: Pulmonary effort is normal. No respiratory distress.     Breath sounds: Normal breath sounds.  Abdominal:     Palpations: Abdomen is soft. There is no hepatomegaly or mass.     Tenderness: There is no abdominal tenderness.  Musculoskeletal:     Comments: RLE foot/ankle brace.  Lymphadenopathy:     Cervical: No cervical adenopathy.  Skin:    General: Skin is warm.     Findings: No erythema or rash.  Neurological:     Mental Status: She is alert and oriented to person, place, and time.     Cranial Nerves: No cranial nerve deficit.     Motor: Atrophy (RLE) present.     Comments: Mildly unstable gait, not assisted today. Right-sided weakness, mainly LE.  Psychiatric:        Speech: Speech normal.     Comments: Well groomed, good eye contact.    ASSESSMENT AND PLAN:  Crystal Levy was here today annual physical examination.  Orders Placed This Encounter  Procedures  . DG Bone Density  . COMPLETE METABOLIC PANEL WITH GFR  . Lipid panel  . Hepatitis C antibody    Encounter for general adult medical examination with abnormal findings She understands the importance of regular physical activity and healthy diet for prevention of chronic illness and/or complications. Preventive guidelines reviewed. Vaccination up to date.  Ca++ and vit D supplementation recommended. Next CPE in a year.  Asymptomatic postmenopausal estrogen deficiency -     DG Bone Density;  Future  Encounter for HCV screening test for low risk patient -     Hepatitis C antibody  Frequent falls Fall precautions discussed. Avoid identified trigger factors.  HTN (hypertension) BP adequately controlled. Continue current  ,management.  Hyperlipidemia Continue Crestor 5 mg daily. Further recommendations according FLP results.  Anxiety disorder, unspecified Problem improved with Celexa 10 mg but not significant, so celexa dose increased to 20 mg. No changes in Lorazepam dose. Some side effects of meds discussed.  Return in 4 months (on 12/10/2020) for anxiety.   Crystal Hillebrand G. [place], MD  [facility]. [place] office.   A few tips:  -As we age balance is not as good as it was, so there is a higher risks for falls. Please remove small rugs and furniture that is "in your way" and could increase the risk of falls. Stretching exercises may help with fall prevention: Yoga and Tai Chi are some examples. Low impact exercise is better, so you are not very achy the next day.  -Sun screen and avoidance of direct sun light recommended. Caution with dehydration, if working outdoors be sure to drink enough fluids.  - Some medications are not safe as we age, increases the risk of side effects and can potentially interact with other medication you are also taken;  including some of over the counter medications. Be sure to let me know when you start a new medication even if it is a dietary/vitamin supplement.   -Healthy diet low in red meet/animal fat and sugar + regular physical activity is recommended.    A few things to remember from today's visit:   Hyperlipidemia, unspecified hyperlipidemia type - Plan: COMPLETE METABOLIC PANEL WITH GFR, Lipid panel  Primary hypertension - Plan: COMPLETE METABOLIC PANEL WITH GFR  Anxiety disorder, unspecified type - Plan: citalopram (CELEXA) 20 MG tablet  Encounter for general adult medical examination with abnormal findings  Asymptomatic postmenopausal estrogen deficiency - Plan: DG Bone Density  If you need refills please call your pharmacy. Do not use My Chart to request refills or for acute issues that need immediate attention.   Celexa increased from 10 mg  to 20 mg.  Please be sure medication list is accurate. If a new problem present, please set up appointment sooner than planned today.

## 2020-08-11 NOTE — Assessment & Plan Note (Signed)
Continue Crestor 5 mg daily. Further recommendations according FLP results.

## 2020-08-14 LAB — HEPATITIS C ANTIBODY
Hepatitis C Ab: NONREACTIVE
SIGNAL TO CUT-OFF: 0.02 (ref <1.00)

## 2020-10-12 ENCOUNTER — Other Ambulatory Visit: Payer: Self-pay | Admitting: Family Medicine

## 2020-10-12 DIAGNOSIS — F419 Anxiety disorder, unspecified: Secondary | ICD-10-CM

## 2020-10-23 DIAGNOSIS — Z961 Presence of intraocular lens: Secondary | ICD-10-CM | POA: Diagnosis not present

## 2020-10-26 ENCOUNTER — Other Ambulatory Visit: Payer: Self-pay

## 2020-10-27 ENCOUNTER — Encounter: Payer: Self-pay | Admitting: Family Medicine

## 2020-10-27 ENCOUNTER — Ambulatory Visit (INDEPENDENT_AMBULATORY_CARE_PROVIDER_SITE_OTHER): Payer: Medicare Other | Admitting: Family Medicine

## 2020-10-27 VITALS — BP 132/70 | HR 78 | Resp 16 | Ht 66.0 in | Wt 147.0 lb

## 2020-10-27 DIAGNOSIS — L570 Actinic keratosis: Secondary | ICD-10-CM

## 2020-10-27 DIAGNOSIS — Z1239 Encounter for other screening for malignant neoplasm of breast: Secondary | ICD-10-CM

## 2020-10-27 DIAGNOSIS — L821 Other seborrheic keratosis: Secondary | ICD-10-CM

## 2020-10-27 DIAGNOSIS — R296 Repeated falls: Secondary | ICD-10-CM | POA: Diagnosis not present

## 2020-10-27 DIAGNOSIS — F419 Anxiety disorder, unspecified: Secondary | ICD-10-CM

## 2020-10-27 NOTE — Patient Instructions (Addendum)
A few things to remember from today's visit:  AK (actinic keratosis)  Seborrheic keratosis  Falls frequently  Encounter for screening for malignant neoplasm of breast, unspecified screening modality - Plan: MM 3D SCREEN BREAST BILATERAL  Be sure to increase celexa to 20 mg and follow up in late April.  If you need refills please call your pharmacy. Do not use My Chart to request refills or for acute issues that need immediate attention.   Fall Prevention in the Home, Adult Falls can cause injuries and can happen to people of all ages. There are many things you can do to make your home safe and to help prevent falls. Ask for help when making these changes. What actions can I take to prevent falls? General Instructions  Use good lighting in all rooms. Replace any light bulbs that burn out.  Turn on the lights in dark areas. Use night-lights.  Keep items that you use often in easy-to-reach places. Lower the shelves around your home if needed.  Set up your furniture so you have a clear path. Avoid moving your furniture around.  Do not have throw rugs or other things on the floor that can make you trip.  Avoid walking on wet floors.  If any of your floors are uneven, fix them.  Add color or contrast paint or tape to clearly mark and help you see: ? Grab bars or handrails. ? First and last steps of staircases. ? Where the edge of each step is.  If you use a stepladder: ? Make sure that it is fully opened. Do not climb a closed stepladder. ? Make sure the sides of the stepladder are locked in place. ? Ask someone to hold the stepladder while you use it.  Know where your pets are when moving through your home. What can I do in the bathroom?  Keep the floor dry. Clean up any water on the floor right away.  Remove soap buildup in the tub or shower.  Use nonskid mats or decals on the floor of the tub or shower.  Attach bath mats securely with double-sided, nonslip rug  tape.  If you need to sit down in the shower, use a plastic, nonslip stool.  Install grab bars by the toilet and in the tub and shower. Do not use towel bars as grab bars.      What can I do in the bedroom?  Make sure that you have a light by your bed that is easy to reach.  Do not use any sheets or blankets for your bed that hang to the floor.  Have a firm chair with side arms that you can use for support when you get dressed. What can I do in the kitchen?  Clean up any spills right away.  If you need to reach something above you, use a step stool with a grab bar.  Keep electrical cords out of the way.  Do not use floor polish or wax that makes floors slippery. What can I do with my stairs?  Do not leave any items on the stairs.  Make sure that you have a light switch at the top and the bottom of the stairs.  Make sure that there are handrails on both sides of the stairs. Fix handrails that are broken or loose.  Install nonslip stair treads on all your stairs.  Avoid having throw rugs at the top or bottom of the stairs.  Choose a carpet that does not hide the  edge of the steps on the stairs.  Check carpeting to make sure that it is firmly attached to the stairs. Fix carpet that is loose or worn. What can I do on the outside of my home?  Use bright outdoor lighting.  Fix the edges of walkways and driveways and fix any cracks.  Remove anything that might make you trip as you walk through a door, such as a raised step or threshold.  Trim any bushes or trees on paths to your home.  Check to see if handrails are loose or broken and that both sides of all steps have handrails.  Install guardrails along the edges of any raised decks and porches.  Clear paths of anything that can make you trip, such as tools or rocks.  Have leaves, snow, or ice cleared regularly.  Use sand or salt on paths during winter.  Clean up any spills in your garage right away. This includes  grease or oil spills. What other actions can I take?  Wear shoes that: ? Have a low heel. Do not wear high heels. ? Have rubber bottoms. ? Feel good on your feet and fit well. ? Are closed at the toe. Do not wear open-toe sandals.  Use tools that help you move around if needed. These include: ? Canes. ? Walkers. ? Scooters. ? Crutches.  Review your medicines with your doctor. Some medicines can make you feel dizzy. This can increase your chance of falling. Ask your doctor what else you can do to help prevent falls. Where to find more information  Centers for Disease Control and Prevention, STEADI: http://www.wolf.info/  National Institute on Aging: http://kim-miller.com/ Contact a doctor if:  You are afraid of falling at home.  You feel weak, drowsy, or dizzy at home.  You fall at home. Summary  There are many simple things that you can do to make your home safe and to help prevent falls.  Ways to make your home safe include removing things that can make you trip and installing grab bars in the bathroom.  Ask for help when making these changes in your home. This information is not intended to replace advice given to you by your health care provider. Make sure you discuss any questions you have with your health care provider. Document Revised: 03/29/2020 Document Reviewed: 03/29/2020 Elsevier Patient Education  [place].  Seborrheic Keratosis A seborrheic keratosis is a common, noncancerous (benign) skin growth. These growths are velvety, waxy, rough, tan, brown, or black spots that appear on the skin. These skin growths can be flat or raised, and scaly. What are the causes? The cause of this condition is not known. What increases the risk? You are more likely to develop this condition if you:  Have a family history of seborrheic keratosis.  Are 50 or older.  Are pregnant.  Have had estrogen replacement therapy. What are the signs or symptoms? Symptoms of this condition  include growths on the face, chest, shoulders, back, or other areas. These growths:  Are usually painless, but may become irritated and itchy.  Can be yellow, brown, black, or other colors.  Are slightly raised or have a flat surface.  Are sometimes rough or wart-like in texture.  Are often velvety or waxy on the surface.  Are round or oval-shaped.  Often occur in groups, but may occur as a single growth.   How is this diagnosed? This condition is diagnosed with a medical history and physical exam.  A sample of  the growth may be tested (skin biopsy).  You may need to see a skin specialist (dermatologist). How is this treated? Treatment is not usually needed for this condition, unless the growths are irritated or bleed often.  You may also choose to have the growths removed if you do not like their appearance. ? Most commonly, these growths are treated with a procedure in which liquid nitrogen is applied to "freeze" off the growth (cryosurgery). ? They may also be burned off with electricity (electrocautery) or removed by scraping (curettage). Follow these instructions at home:  Watch your growth for any changes.  Keep all follow-up visits as told by your health care provider. This is important.  Do not scratch or pick at the growth or growths. This can cause them to become irritated or infected. Contact a health care provider if:  You suddenly have many new growths.  Your growth bleeds, itches, or hurts.  Your growth suddenly becomes larger or changes color. Summary  A seborrheic keratosis is a common, noncancerous (benign) skin growth.  Treatment is not usually needed for this condition, unless the growths are irritated or bleed often.  Watch your growth for any changes.  Contact a health care provider if you suddenly have many new growths or your growth suddenly becomes larger or changes color.  Keep all follow-up visits as told by your health care provider. This  is important. This information is not intended to replace advice given to you by your health care provider. Make sure you discuss any questions you have with your health care provider. Document Revised: 01/08/2018 Document Reviewed: 01/08/2018 Elsevier Patient Education  2021 [place].   Please be sure medication list is accurate. If a new problem present, please set up appointment sooner than planned today.

## 2020-10-27 NOTE — Progress Notes (Signed)
Chief Complaint  Patient presents with  . spot on forehead   HPI: Crystal Levy is a 80 y.o. female, who is here today complaining of lesion on her forehead noted about a week ago. Felt lesion upon rubbing area with fingers about 1-2 weeks. She is not sure for how long lesion has been there. No associated pruritus,tenernessmor easy bleeding. No personal hx of skin cancer. She is concerned because she is not very consistent with applying sun screening and does not wear a hat when working outdoors. She also felt another lesion left submandibular after a fall a week ago, she wonders if caused by trauma. She cannot see it but feels it. Not tender or pruritic.  She has a dermatologist, Dr Ouida Sills.  She has not tried OTC medications.  Concerned because she has not received a call about DEXA, order was placed in 08/2020. [facility] -[facility].  She is also wondering about mammogram. Last mammogram in 2016.  She has had 3 falls since her last visit. Last 2 at home. Tripped 3 weeks and 1 week ago. Left temporal skin tear, she did not seem medical attention, healed.  She exercises daily, walk and does stretching. Unstable gait, OA and residual effects of polio.  Last visit Celexa was increased, she forgot, currently she is still taking 10 mg daily. She is also on Lorazepam 1 mg daily as needed.  Review of Systems  Constitutional: Negative for activity change, appetite change and fever.  HENT: Negative for mouth sores, nosebleeds and sore throat.   Respiratory: Negative for cough, shortness of breath and wheezing.   Cardiovascular: Negative for chest pain, palpitations and leg swelling.  Gastrointestinal: Negative for abdominal pain, nausea and vomiting.       Negative for changes in bowel habits.  Genitourinary: Negative for decreased urine volume, dysuria and hematuria.  Musculoskeletal: Positive for arthralgias and gait problem.   Neurological: Negative for syncope, facial asymmetry and weakness.  Rest see pertinent positives and negatives per HPI.  Current Outpatient Medications on File Prior to Visit  Medication Sig Dispense Refill  . Alpha-Lipoic Acid 100 MG TABS Take by mouth.    Marland Kitchen b complex vitamins tablet Take 1 tablet by mouth daily.    . benazepril (LOTENSIN) 10 MG tablet TAKE 1 TABLET BY MOUTH  DAILY 90 tablet 3  . Biotin 1000 MCG tablet Take 1,000 mcg by mouth daily.    . calcium carbonate (OSCAL) 1500 (600 Ca) MG TABS tablet Take by mouth.    . Cholecalciferol (VITAMIN D-3) 1000 UNITS CAPS Take 2 capsules by mouth daily.    . citalopram (CELEXA) 20 MG tablet TAKE 1 TABLET BY MOUTH  DAILY 90 tablet 3  . co-enzyme Q-10 50 MG capsule Take 50 mg by mouth daily.    Marland Kitchen DIGESTIVE ENZYMES PO Take by mouth.    . levothyroxine (SYNTHROID) 88 MCG tablet TAKE 1 TABLET BY MOUTH  DAILY BEFORE BREAKFAST 90 tablet 3  . LORazepam (ATIVAN) 1 MG tablet Take 1 tablet (1 mg total) by mouth at bedtime as needed for anxiety. 30 tablet 3  . Magnesium 100 MG CAPS Take by mouth.    . magnesium hydroxide (MILK OF MAGNESIA) 400 MG/5ML suspension Take by mouth.    . Milk Thistle Extract 175 MG TABS Take by mouth.    . Multiple Vitamins-Minerals (MULTIVITAL) tablet Take 1 tablet by mouth daily.    . NON FORMULARY Place under the  tongue. [place] Flower Essence    . pantoprazole (PROTONIX) 40 MG tablet TAKE 1 TABLET BY MOUTH  DAILY 90 tablet 3  . potassium gluconate 595 MG TABS tablet Take 595 mg by mouth daily.    . Prenatal Vit-Fe Fumarate-FA (PRENATAL VITAMIN PO) Take by mouth.    . rosuvastatin (CRESTOR) 5 MG tablet Take 1 tablet (5 mg total) by mouth daily. 90 tablet 1  . Selenium (SELENIMIN PO) Take by mouth daily.     No current facility-administered medications on file prior to visit.   Past Medical History:  Diagnosis Date  . Anxiety   . Arthritis   . Bicuspid aortic valve   . Depression   . Hypertension   .  Hypothyroidism   . Polio   . Thyroid disease    Allergies  Allergen Reactions  . Clindamycin/Lincomycin Diarrhea and Other (See Comments)    C-Diff  . Fluticasone     Headache and nosebleed   . Azithromycin   . Eggs Or Egg-Derived Products   . Milk-Related Compounds     Unknown reaction  . Penicillins Rash  . Sulfa Antibiotics Diarrhea  . Sulfamethoxazole-Trimethoprim   . Fenofibrate Rash    Social History   Socioeconomic History  . Marital status: Divorced    Spouse name: Not on file  . Number of children: 2  . Years of education: Not on file  . Highest education level: Not on file  Occupational History    Employer: [employer]  Tobacco Use  . Smoking status: Never Smoker  . Smokeless tobacco: Never Used  . Tobacco comment: Rare smoker in the distant past.   Substance and Sexual Activity  . Alcohol use: Yes    Alcohol/week: 0.0 standard drinks    Comment: wine  . Drug use: No  . Sexual activity: Not on file  Other Topics Concern  . 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    Social Determinants of Health   Financial Resource Strain: Not on file  Food Insecurity: Not on file  Transportation Needs: Not on file  Physical Activity: Not on file  Stress: Not on file  Social Connections: Not on file    Vitals:   10/27/20 1353  BP: 132/70  Pulse: 78  Resp: 16  SpO2: 97%   Body mass index is 23.73 kg/m.  Physical Exam Vitals and nursing note reviewed.  Constitutional:      Appearance: She is normal weight.  HENT:     Head: Normocephalic and atraumatic.  Eyes:     Conjunctiva/sclera: Conjunctivae normal.  Neck:   Pulmonary:     Effort: Pulmonary effort is normal. No respiratory distress.  Skin:    General: Skin is warm.          Comments: Right forehead with 2-3 mm ,mildly raised lesion,pinkish,regular borders, rough surface. Left submandibular 4-5 mm lesion,scaly, not raised.See neck  graphic.  Neurological:     Mental Status: She is alert and oriented to person, place, and time.     Comments: Unstable gait, not assistance today.  Psychiatric:        Mood and Affect: Mood is anxious. Mood is not depressed.    ASSESSMENT AND PLAN:  Ms/Crystal Levy was seen today for spot on forehead.  Diagnoses and all orders for this visit:  AK (actinic keratosis) We discussed Dx,prognosis,and treatment options. Sun screen and mechanical protection against direct UV light exposure. After discussing procedure and  verbal consent, affected areas were treated with liquid nitrogen x 3 each one. Post procedure instruction given. She tolerated well.  Seborrheic keratosis Educated about Dx. Reassured.  Falls frequently Fall precautions discussed. She does not think PT is needed at this time.  Encounter for screening for malignant neoplasm of breast, unspecified screening modality We discussed guidelines. She would like to have mammogram done, DEXA can be done same day, order still active.  -     MM 3D SCREEN BREAST BILATERAL; Future  Anxiety disorder, unspecified type Celexa to be increased from 10 mg to 20 mg daily. No changes in Lorazepam dose. Keep f/u appt in 12/2020.  Spent 35 minutes.  During this time history was obtained and documented, examination was performed, and assessment/plan discussed.  Return in about 8 weeks (around 12/22/2020).   Annaclaire Walsworth G. [place], MD  [facility]. [place] office.    A few things to remember from today's visit:  AK (actinic keratosis)  Seborrheic keratosis  Falls frequently  Encounter for screening for malignant neoplasm of breast, unspecified screening modality - Plan: MM 3D SCREEN BREAST BILATERAL  Be sure to increase celexa to 20 mg and follow up in late April.  If you need refills please call your pharmacy. Do not use My Chart to request refills or for acute issues that need immediate attention.   Fall Prevention  in the Home, Adult Falls can cause injuries and can happen to people of all ages. There are many things you can do to make your home safe and to help prevent falls. Ask for help when making these changes. What actions can I take to prevent falls? General Instructions  Use good lighting in all rooms. Replace any light bulbs that burn out.  Turn on the lights in dark areas. Use night-lights.  Keep items that you use often in easy-to-reach places. Lower the shelves around your home if needed.  Set up your furniture so you have a clear path. Avoid moving your furniture around.  Do not have throw rugs or other things on the floor that can make you trip.  Avoid walking on wet floors.  If any of your floors are uneven, fix them.  Add color or contrast paint or tape to clearly mark and help you see: ? Grab bars or handrails. ? First and last steps of staircases. ? Where the edge of each step is.  If you use a stepladder: ? Make sure that it is fully opened. Do not climb a closed stepladder. ? Make sure the sides of the stepladder are locked in place. ? Ask someone to hold the stepladder while you use it.  Know where your pets are when moving through your home. What can I do in the bathroom?  Keep the floor dry. Clean up any water on the floor right away.  Remove soap buildup in the tub or shower.  Use nonskid mats or decals on the floor of the tub or shower.  Attach bath mats securely with double-sided, nonslip rug tape.  If you need to sit down in the shower, use a plastic, nonslip stool.  Install grab bars by the toilet and in the tub and shower. Do not use towel bars as grab bars.      What can I do in the bedroom?  Make sure that you have a light by your bed that is easy to reach.  Do not use any sheets or blankets for your bed that hang to the floor.  Have a firm chair with side arms that you can use for support when you get dressed. What can I do in the  kitchen?  Clean up any spills right away.  If you need to reach something above you, use a step stool with a grab bar.  Keep electrical cords out of the way.  Do not use floor polish or wax that makes floors slippery. What can I do with my stairs?  Do not leave any items on the stairs.  Make sure that you have a light switch at the top and the bottom of the stairs.  Make sure that there are handrails on both sides of the stairs. Fix handrails that are broken or loose.  Install nonslip stair treads on all your stairs.  Avoid having throw rugs at the top or bottom of the stairs.  Choose a carpet that does not hide the edge of the steps on the stairs.  Check carpeting to make sure that it is firmly attached to the stairs. Fix carpet that is loose or worn. What can I do on the outside of my home?  Use bright outdoor lighting.  Fix the edges of walkways and driveways and fix any cracks.  Remove anything that might make you trip as you walk through a door, such as a raised step or threshold.  Trim any bushes or trees on paths to your home.  Check to see if handrails are loose or broken and that both sides of all steps have handrails.  Install guardrails along the edges of any raised decks and porches.  Clear paths of anything that can make you trip, such as tools or rocks.  Have leaves, snow, or ice cleared regularly.  Use sand or salt on paths during winter.  Clean up any spills in your garage right away. This includes grease or oil spills. What other actions can I take?  Wear shoes that: ? Have a low heel. Do not wear high heels. ? Have rubber bottoms. ? Feel good on your feet and fit well. ? Are closed at the toe. Do not wear open-toe sandals.  Use tools that help you move around if needed. These include: ? Canes. ? Walkers. ? Scooters. ? Crutches.  Review your medicines with your doctor. Some medicines can make you feel dizzy. This can increase your chance of  falling. Ask your doctor what else you can do to help prevent falls. Where to find more information  Centers for Disease Control and Prevention, STEADI: http://www.wolf.info/  National Institute on Aging: http://kim-miller.com/ Contact a doctor if:  You are afraid of falling at home.  You feel weak, drowsy, or dizzy at home.  You fall at home. Summary  There are many simple things that you can do to make your home safe and to help prevent falls.  Ways to make your home safe include removing things that can make you trip and installing grab bars in the bathroom.  Ask for help when making these changes in your home. This information is not intended to replace advice given to you by your health care provider. Make sure you discuss any questions you have with your health care provider. Document Revised: 03/29/2020 Document Reviewed: 03/29/2020 Elsevier Patient Education  [place].  Seborrheic Keratosis A seborrheic keratosis is a common, noncancerous (benign) skin growth. These growths are velvety, waxy, rough, tan, brown, or black spots that appear on the skin. These skin growths can be flat or raised, and scaly. What  are the causes? The cause of this condition is not known. What increases the risk? You are more likely to develop this condition if you:  Have a family history of seborrheic keratosis.  Are 50 or older.  Are pregnant.  Have had estrogen replacement therapy. What are the signs or symptoms? Symptoms of this condition include growths on the face, chest, shoulders, back, or other areas. These growths:  Are usually painless, but may become irritated and itchy.  Can be yellow, brown, black, or other colors.  Are slightly raised or have a flat surface.  Are sometimes rough or wart-like in texture.  Are often velvety or waxy on the surface.  Are round or oval-shaped.  Often occur in groups, but may occur as a single growth.   How is this diagnosed? This condition  is diagnosed with a medical history and physical exam.  A sample of the growth may be tested (skin biopsy).  You may need to see a skin specialist (dermatologist). How is this treated? Treatment is not usually needed for this condition, unless the growths are irritated or bleed often.  You may also choose to have the growths removed if you do not like their appearance. ? Most commonly, these growths are treated with a procedure in which liquid nitrogen is applied to "freeze" off the growth (cryosurgery). ? They may also be burned off with electricity (electrocautery) or removed by scraping (curettage). Follow these instructions at home:  Watch your growth for any changes.  Keep all follow-up visits as told by your health care provider. This is important.  Do not scratch or pick at the growth or growths. This can cause them to become irritated or infected. Contact a health care provider if:  You suddenly have many new growths.  Your growth bleeds, itches, or hurts.  Your growth suddenly becomes larger or changes color. Summary  A seborrheic keratosis is a common, noncancerous (benign) skin growth.  Treatment is not usually needed for this condition, unless the growths are irritated or bleed often.  Watch your growth for any changes.  Contact a health care provider if you suddenly have many new growths or your growth suddenly becomes larger or changes color.  Keep all follow-up visits as told by your health care provider. This is important. This information is not intended to replace advice given to you by your health care provider. Make sure you discuss any questions you have with your health care provider. Document Revised: 01/08/2018 Document Reviewed: 01/08/2018 Elsevier Patient Education  2021 [place].   Please be sure medication list is accurate. If a new problem present, please set up appointment sooner than planned today.

## 2020-10-31 ENCOUNTER — Other Ambulatory Visit: Payer: Self-pay | Admitting: Family Medicine

## 2020-11-02 ENCOUNTER — Other Ambulatory Visit: Payer: Self-pay | Admitting: Family Medicine

## 2020-11-02 DIAGNOSIS — F419 Anxiety disorder, unspecified: Secondary | ICD-10-CM

## 2020-11-02 NOTE — Telephone Encounter (Signed)
[facility] is calling in to make sure that we received the request for Rx lorazepam (ATIVAN) 1 MG due to their system was down and was not sure it came to [place].  Pharm:  [facility] on [address].

## 2020-11-03 NOTE — Telephone Encounter (Signed)
Last filled 10/05/20

## 2020-11-10 DIAGNOSIS — Z1231 Encounter for screening mammogram for malignant neoplasm of breast: Secondary | ICD-10-CM | POA: Diagnosis not present

## 2020-11-10 DIAGNOSIS — M8589 Other specified disorders of bone density and structure, multiple sites: Secondary | ICD-10-CM | POA: Diagnosis not present

## 2020-11-10 DIAGNOSIS — Z780 Asymptomatic menopausal state: Secondary | ICD-10-CM | POA: Diagnosis not present

## 2020-11-10 LAB — HM DEXA SCAN

## 2020-11-10 LAB — HM MAMMOGRAPHY

## 2020-11-14 ENCOUNTER — Encounter: Payer: Self-pay | Admitting: Family Medicine

## 2020-11-17 ENCOUNTER — Encounter: Payer: Self-pay | Admitting: Family Medicine

## 2020-11-20 ENCOUNTER — Encounter: Payer: Self-pay | Admitting: Family Medicine

## 2020-11-21 MED ORDER — ALENDRONATE SODIUM 70 MG PO TABS: 70.0000 mg | ORAL_TABLET | ORAL | 11 refills | Status: DC

## 2020-12-21 ENCOUNTER — Other Ambulatory Visit: Payer: Self-pay

## 2020-12-25 ENCOUNTER — Ambulatory Visit (INDEPENDENT_AMBULATORY_CARE_PROVIDER_SITE_OTHER): Payer: Medicare Other | Admitting: Family Medicine

## 2020-12-25 ENCOUNTER — Other Ambulatory Visit: Payer: Self-pay

## 2020-12-25 ENCOUNTER — Encounter: Payer: Self-pay | Admitting: Family Medicine

## 2020-12-25 VITALS — BP 136/72 | HR 69 | Temp 98.2°F | Resp 16 | Ht 66.0 in

## 2020-12-25 DIAGNOSIS — I10 Essential (primary) hypertension: Secondary | ICD-10-CM

## 2020-12-25 DIAGNOSIS — E039 Hypothyroidism, unspecified: Secondary | ICD-10-CM | POA: Diagnosis not present

## 2020-12-25 DIAGNOSIS — E785 Hyperlipidemia, unspecified: Secondary | ICD-10-CM | POA: Diagnosis not present

## 2020-12-25 DIAGNOSIS — F419 Anxiety disorder, unspecified: Secondary | ICD-10-CM

## 2020-12-25 DIAGNOSIS — R296 Repeated falls: Secondary | ICD-10-CM | POA: Diagnosis not present

## 2020-12-25 NOTE — Progress Notes (Signed)
HPI: [name] is a 80 y.o. female, who is here today for 2-3 months follow up.   She was last seen on 10/27/20. No new problems since her last visit. Received 2nd COVID 19 booster on 12/08/20.  Anxiety and depression: Currently she is on citalopram 20 mg daily, dose was increased last visit. She is also on lorazepam 1 mg twice daily as needed. She is tolerating medication well.  She has noted improvement since the dose of citalopram was increased. Negative for suicidal thoughts.  She feels like she is sleeping better. She lives alone, she has a good family support, and she feels safe at home.  Right-sided weakness, sequela status post polio. She feels like weakness has gotten worse through the years. Negative for new associated symptom. 2 falls since her last visit, indoors. 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 She does stretching exercises at home.  She is planning on investing in an alarm system in case she is not able to get up.  Hypertension:Dx'ed 17+ years ago.  Currently she is on benazepril 10 mg daily. Negative for severe/frequent headache, visual changes, chest pain, dyspnea, palpitation,or worsening edema.  Lab Results  Component Value Date   CREATININE 0.52 02/01/2020   BUN 12 02/01/2020   NA 141 02/01/2020   K 4.0 02/01/2020   CL 103 02/01/2020   CO2 29 02/01/2020   Hyperlipidemia: Currently she is on Crestor 5 mg daily. PAD. Tolerating medication well. She also follows low fat diet. Last FLP in 05/2019: TC 228,TG 142,HDL 40,and LDL 159.  Hypothyroidism: She is on Levothyroxine 88 mcg daily. Negative for changes in bowel habits or abnormal wt loss. Last TSH on 02/01/20 was 0.5.  Review of Systems  Constitutional: Negative for activity change, appetite change and fever.  HENT: Negative for mouth sores, nosebleeds and sore throat.   Respiratory: Negative for cough and wheezing.   Gastrointestinal: Negative for  abdominal pain, nausea and vomiting.  Endocrine: Negative for cold intolerance and heat intolerance.  Genitourinary: Negative for decreased urine volume, dysuria and hematuria.  Musculoskeletal: Positive for arthralgias and gait problem.  Skin: Negative for pallor and rash.  Neurological: Negative for syncope and facial asymmetry.  Psychiatric/Behavioral: Negative for confusion and hallucinations.  Rest of ROS, see pertinent positives sand negatives in HPI  Current Outpatient Medications on File Prior to Visit  Medication Sig Dispense Refill  . alendronate (FOSAMAX) 70 MG tablet Take 1 tablet (70 mg total) by mouth every 7 (seven) days. Take with a full glass of water on an empty stomach. 4 tablet 11  . Alpha-Lipoic Acid 100 MG TABS Take by mouth.    Marland Kitchen b complex vitamins tablet Take 1 tablet by mouth daily.    . benazepril (LOTENSIN) 10 MG tablet TAKE 1 TABLET BY MOUTH  DAILY 90 tablet 3  . Biotin 1000 MCG tablet Take 1,000 mcg by mouth daily.    . calcium carbonate (OSCAL) 1500 (600 Ca) MG TABS tablet Take by mouth.    . Cholecalciferol (VITAMIN D-3) 1000 UNITS CAPS Take 2 capsules by mouth daily.    . citalopram (CELEXA) 20 MG tablet TAKE 1 TABLET BY MOUTH  DAILY 90 tablet 3  . co-enzyme Q-10 50 MG capsule Take 50 mg by mouth daily.    Marland Kitchen DIGESTIVE ENZYMES PO Take by mouth.    . levothyroxine (SYNTHROID) 88 MCG tablet TAKE 1 TABLET BY MOUTH  DAILY BEFORE BREAKFAST 90 tablet 3  .  LORazepam (ATIVAN) 1 MG tablet TAKE 1 TABLET BY MOUTH AT BEDTIME AS NEEDED FOR ANXIETY 30 tablet 3  . Magnesium 100 MG CAPS Take by mouth.    . magnesium hydroxide (MILK OF MAGNESIA) 400 MG/5ML suspension Take by mouth.    . Milk Thistle Extract 175 MG TABS Take by mouth.    . Multiple Vitamins-Minerals (MULTIVITAL) tablet Take 1 tablet by mouth daily.    . NON FORMULARY Place under the tongue. [place] Flower Essence    . pantoprazole (PROTONIX) 40 MG tablet TAKE 1 TABLET BY MOUTH  DAILY 90 tablet 3  .  potassium gluconate 595 MG TABS tablet Take 595 mg by mouth daily.    . Prenatal Vit-Fe Fumarate-FA (PRENATAL VITAMIN PO) Take by mouth.    . rosuvastatin (CRESTOR) 5 MG tablet TAKE 1 TABLET BY MOUTH  DAILY 90 tablet 3  . Selenium (SELENIMIN PO) Take by mouth daily.     No current facility-administered medications on file prior to visit.   Past Medical History:  Diagnosis Date  . Anxiety   . Arthritis   . Bicuspid aortic valve   . Depression   . Hypertension   . Hypothyroidism   . Polio   . Thyroid disease    Allergies  Allergen Reactions  . Clindamycin/Lincomycin Diarrhea and Other (See Comments)    C-Diff  . Fluticasone     Headache and nosebleed   . Azithromycin   . Eggs Or Egg-Derived Products   . Milk-Related Compounds     Unknown reaction  . Penicillins Rash  . Sulfa Antibiotics Diarrhea  . Sulfamethoxazole-Trimethoprim   . Fenofibrate Rash    Social History   Socioeconomic History  . Marital status: Divorced    Spouse name: Not on file  . Number of children: 2  . Years of education: Not on file  . Highest education level: Not on file  Occupational History    Employer: [employer]  Tobacco Use  . Smoking status: Never Smoker  . Smokeless tobacco: Never Used  . Tobacco comment: Rare smoker in the distant past.   Substance and Sexual Activity  . Alcohol use: Yes    Alcohol/week: 0.0 standard drinks    Comment: wine  . Drug use: No  . Sexual activity: Not on file  Other Topics Concern  . 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    Social Determinants of Health   Financial Resource Strain: Not on file  Food Insecurity: Not on file  Transportation Needs: Not on file  Physical Activity: Not on file  Stress: Not on file  Social Connections: Not on file   Vitals:   12/25/20 1134  BP: 136/72  Pulse: 69  Resp: 16  Temp: 98.2 F (36.8 C)  SpO2: 97%   Body mass index is 23.73  kg/m.  Physical Exam Vitals and nursing note reviewed.  Constitutional:      General: She is not in acute distress.    Appearance: She is well-developed.  HENT:     Head: Normocephalic and atraumatic.  Eyes:     Conjunctiva/sclera: Conjunctivae normal.     Pupils: Pupils are equal, round, and reactive to light.  Cardiovascular:     Rate and Rhythm: Normal rate and regular rhythm.     Heart sounds: No murmur heard.     Comments: Trace pitting RLE edema and pedal edema. DP pulses present. Pulmonary:     Effort: Pulmonary  effort is normal. No respiratory distress.     Breath sounds: Normal breath sounds.  Abdominal:     Palpations: Abdomen is soft. There is no mass.     Tenderness: There is no abdominal tenderness.  Lymphadenopathy:     Cervical: No cervical adenopathy.  Skin:    General: Skin is warm.     Findings: No erythema or rash.  Neurological:     Mental Status: She is alert and oriented to person, place, and time.     Cranial Nerves: No cranial nerve deficit.     Gait: Gait normal.     Comments: RLE muscle atrophy. Mildly unstable gait, not assisted.  Psychiatric:     Comments: Well groomed, good eye contact.   ASSESSMENT AND PLAN:  [name] was seen today for 2-3 months follow-up.  Orders Placed This Encounter  Procedures  . Comprehensive metabolic panel  . TSH  . Lipid panel  . Ambulatory referral to [place]   Lab Results  Component Value Date   TSH 3.11 12/26/2020   Lab Results  Component Value Date   CREATININE 0.59 12/26/2020   BUN 13 12/26/2020   NA 139 12/26/2020   K 4.2 12/26/2020   CL 101 12/26/2020   CO2 32 12/26/2020   Lab Results  Component Value Date   ALT 12 12/26/2020   AST 16 12/26/2020   ALKPHOS 103 12/26/2020   BILITOT 0.6 12/26/2020   Lab Results  Component Value Date   CHOL 173 12/26/2020   HDL 50.80 12/26/2020   LDLDIRECT 111.0 12/26/2020   TRIG 201.0 (H) 12/26/2020   CHOLHDL 3 12/26/2020    Frequent falls Fall precautions discussed. Some of her chronic problems as well as medications increase her risk for falls. PT and OT evaluation will be arranged.  Hypothyroidism (acquired) Problem has been stable. Continue Levothyroxine 88 mcg daily. Dose of medication will be adjusted,if needed,according to TSH.  Hyperlipidemia, unspecified hyperlipidemia type Continue Crestor 5 mg daily.  Primary hypertension BP adequately controlled. Continue Benazepril 10 mg daily and low salt diet.  Anxiety disorder, unspecified type Improved. Continue Citalopram 20 mg daily and Lorazepam 1 mg bid prn.   Return in about 5 months (around 05/27/2021) for Lab appt before 01/11/2021.Marland Kitchen  Crystal Feria G. Martinique, MD  [facility]. [place] office.  A few things to remember from today's visit:   Frequent falls - Plan: Ambulatory referral to [place]  Hypothyroidism (acquired) - Plan: TSH  Hyperlipidemia, unspecified hyperlipidemia type - Plan: Comprehensive metabolic panel, Lipid panel  Primary hypertension - Plan: Comprehensive metabolic panel  If you need refills please call your pharmacy. Do not use My Chart to request refills or for acute issues that need immediate attention.   No changes in medications today. PT at home will be arranged to work on fall prevention.  Please be sure medication list is accurate. If a new problem present, please set up appointment sooner than planned today.

## 2020-12-25 NOTE — Patient Instructions (Addendum)
A few things to remember from today's visit:   Frequent falls - Plan: Ambulatory referral to [place]  Hypothyroidism (acquired) - Plan: TSH  Hyperlipidemia, unspecified hyperlipidemia type - Plan: Comprehensive metabolic panel, Lipid panel  Primary hypertension - Plan: Comprehensive metabolic panel  If you need refills please call your pharmacy. Do not use My Chart to request refills or for acute issues that need immediate attention.   No changes in medications today. PT at home will be arranged to work on fall prevention.  Please be sure medication list is accurate. If a new problem present, please set up appointment sooner than planned today.

## 2020-12-26 ENCOUNTER — Other Ambulatory Visit (INDEPENDENT_AMBULATORY_CARE_PROVIDER_SITE_OTHER): Payer: Medicare Other

## 2020-12-26 DIAGNOSIS — E785 Hyperlipidemia, unspecified: Secondary | ICD-10-CM | POA: Diagnosis not present

## 2020-12-26 DIAGNOSIS — E039 Hypothyroidism, unspecified: Secondary | ICD-10-CM

## 2020-12-26 DIAGNOSIS — I10 Essential (primary) hypertension: Secondary | ICD-10-CM | POA: Diagnosis not present

## 2020-12-26 LAB — TSH: TSH: 3.11 u[IU]/mL (ref 0.35–4.50)

## 2020-12-26 LAB — COMPREHENSIVE METABOLIC PANEL
ALT: 12 U/L (ref 0–35)
AST: 16 U/L (ref 0–37)
Albumin: 3.9 g/dL (ref 3.5–5.2)
Alkaline Phosphatase: 103 U/L (ref 39–117)
BUN: 13 mg/dL (ref 6–23)
CO2: 32 mEq/L (ref 19–32)
Calcium: 8.8 mg/dL (ref 8.4–10.5)
Chloride: 101 mEq/L (ref 96–112)
Creatinine, Ser: 0.59 mg/dL (ref 0.40–1.20)
GFR: 85.35 mL/min (ref >60.00)
Glucose, Bld: 97 mg/dL (ref 70–99)
Potassium: 4.2 mEq/L (ref 3.5–5.1)
Sodium: 139 mEq/L (ref 135–145)
Total Bilirubin: 0.6 mg/dL (ref 0.2–1.2)
Total Protein: 6.1 g/dL (ref 6.0–8.3)

## 2020-12-26 LAB — LIPID PANEL
Cholesterol: 173 mg/dL (ref 0–200)
HDL: 50.8 mg/dL (ref >39.00)
NonHDL: 122.06
Total CHOL/HDL Ratio: 3
Triglycerides: 201 mg/dL — ABNORMAL HIGH (ref 0.0–149.0)
VLDL: 40.2 mg/dL — ABNORMAL HIGH (ref 0.0–40.0)

## 2020-12-26 LAB — LDL CHOLESTEROL, DIRECT: Direct LDL: 111 mg/dL

## 2021-01-22 ENCOUNTER — Telehealth: Payer: Self-pay | Admitting: Family Medicine

## 2021-01-22 NOTE — Progress Notes (Signed)
  Chronic Care Management   Note  01/22/2021 Name: Crystal Levy MRN: [id number] DOB: 04/02/1941  Crystal Levy is a 80 y.o. year old female who is a primary care patient of Martinique, Malka So, MD. I reached out to [name] by phone today in response to a referral sent by [name] PCP, Martinique, Betty G, MD.   [name] given information about Chronic Care Management services today including:  1. CCM service includes personalized support from designated clinical staff supervised by her physician, including individualized plan of care and coordination with other care providers 2. 24/7 contact phone numbers for assistance for urgent and routine care needs. 3. Service will only be billed when office clinical staff spend 20 minutes or more in a month to coordinate care. 4. Only one practitioner may furnish and bill the service in a calendar month. 5. The patient may stop CCM services at any time (effective at the end of the month) by phone call to the office staff.   Patient agreed to services and verbal consent obtained.   Follow up plan:   Carley Perdue [employer] Scheduler

## 2021-02-01 DIAGNOSIS — M25511 Pain in right shoulder: Secondary | ICD-10-CM | POA: Diagnosis not present

## 2021-02-01 DIAGNOSIS — M25562 Pain in left knee: Secondary | ICD-10-CM | POA: Diagnosis not present

## 2021-02-02 ENCOUNTER — Ambulatory Visit: Payer: Medicare Other

## 2021-02-12 DIAGNOSIS — R262 Difficulty in walking, not elsewhere classified: Secondary | ICD-10-CM | POA: Diagnosis not present

## 2021-02-12 DIAGNOSIS — Z96652 Presence of left artificial knee joint: Secondary | ICD-10-CM | POA: Diagnosis not present

## 2021-02-12 DIAGNOSIS — M25662 Stiffness of left knee, not elsewhere classified: Secondary | ICD-10-CM | POA: Diagnosis not present

## 2021-02-14 ENCOUNTER — Ambulatory Visit (INDEPENDENT_AMBULATORY_CARE_PROVIDER_SITE_OTHER): Payer: Medicare Other

## 2021-02-14 ENCOUNTER — Other Ambulatory Visit: Payer: Self-pay

## 2021-02-14 VITALS — BP 138/64 | HR 63 | Temp 98.3°F | Ht 65.0 in | Wt 150.0 lb

## 2021-02-14 DIAGNOSIS — Z0000 Encounter for general adult medical examination without abnormal findings: Secondary | ICD-10-CM | POA: Diagnosis not present

## 2021-02-14 NOTE — Patient Instructions (Signed)
[name] , Thank you for taking time to come for your Medicare Wellness Visit. I appreciate your ongoing commitment to your health goals. Please review the following plan we discussed and let me know if I can assist you in the future.   Screening recommendations/referrals: Colonoscopy: current 05/13/2019 Mammogram: current due 11/10/2021 Bone Density: current 11/10/2020 Recommended yearly ophthalmology/optometry visit for glaucoma screening and checkup Recommended yearly dental visit for hygiene and checkup  Vaccinations: Influenza vaccine: due fall 2022 Pneumococcal vaccine: completed series  Tdap vaccine: will obtain upon injury  Shingles vaccine: completed series   Advanced directives: will provide copies   Conditions/risks identified: patient is request new  Right leg brace   Next appointment: none   Preventive Care 38 Years and Older, Female Preventive care refers to lifestyle choices and visits with your health care provider that can promote health and wellness. What does preventive care include?  A yearly physical exam. This is also called an annual well check.  Dental exams once or twice a year.  Routine eye exams. Ask your health care provider how often you should have your eyes checked.  Personal lifestyle choices, including:  Daily care of your teeth and gums.  Regular physical activity.  Eating a healthy diet.  Avoiding tobacco and drug use.  Limiting alcohol use.  Practicing safe sex.  Taking low-dose aspirin every day.  Taking vitamin and mineral supplements as recommended by your health care provider. What happens during an annual well check? The services and screenings done by your health care provider during your annual well check will depend on your age, overall health, lifestyle risk factors, and family history of disease. Counseling  Your health care provider may ask you questions about your:  Alcohol use.  Tobacco use.  Drug  use.  Emotional well-being.  Home and relationship well-being.  Sexual activity.  Eating habits.  History of falls.  Memory and ability to understand (cognition).  Work and work Statistician.  Reproductive health. Screening  You may have the following tests or measurements:  Height, weight, and BMI.  Blood pressure.  Lipid and cholesterol levels. These may be checked every 5 years, or more frequently if you are over 61 years old.  Skin check.  Lung cancer screening. You may have this screening every year starting at age 11 if you have a 30-pack-year history of smoking and currently smoke or have quit within the past 15 years.  Fecal occult blood test (FOBT) of the stool. You may have this test every year starting at age 23.  Flexible sigmoidoscopy or colonoscopy. You may have a sigmoidoscopy every 5 years or a colonoscopy every 10 years starting at age 4.  Hepatitis C blood test.  Hepatitis B blood test.  Sexually transmitted disease (STD) testing.  Diabetes screening. This is done by checking your blood sugar (glucose) after you have not eaten for a while (fasting). You may have this done every 1-3 years.  Bone density scan. This is done to screen for osteoporosis. You may have this done starting at age 41.  Mammogram. This may be done every 1-2 years. Talk to your health care provider about how often you should have regular mammograms. Talk with your health care provider about your test results, treatment options, and if necessary, the need for more tests. Vaccines  Your health care provider may recommend certain vaccines, such as:  Influenza vaccine. This is recommended every year.  Tetanus, diphtheria, and acellular pertussis (Tdap, Td) vaccine. You may need  a Td booster every 10 years.  Zoster vaccine. You may need this after age 5.  Pneumococcal 13-valent conjugate (PCV13) vaccine. One dose is recommended after age 5.  Pneumococcal polysaccharide  (PPSV23) vaccine. One dose is recommended after age 81. Talk to your health care provider about which screenings and vaccines you need and how often you need them. This information is not intended to replace advice given to you by your health care provider. Make sure you discuss any questions you have with your health care provider. Document Released: 09/22/2015 Document Revised: 05/15/2016 Document Reviewed: 06/27/2015 Elsevier Interactive Patient Education  2017 [employer] Prevention in the Home Falls can cause injuries. They can happen to people of all ages. There are many things you can do to make your home safe and to help prevent falls. What can I do on the outside of my home?  Regularly fix the edges of walkways and driveways and fix any cracks.  Remove anything that might make you trip as you walk through a door, such as a raised step or threshold.  Trim any bushes or trees on the path to your home.  Use bright outdoor lighting.  Clear any walking paths of anything that might make someone trip, such as rocks or tools.  Regularly check to see if handrails are loose or broken. Make sure that both sides of any steps have handrails.  Any raised decks and porches should have guardrails on the edges.  Have any leaves, snow, or ice cleared regularly.  Use sand or salt on walking paths during winter.  Clean up any spills in your garage right away. This includes oil or grease spills. What can I do in the bathroom?  Use night lights.  Install grab bars by the toilet and in the tub and shower. Do not use towel bars as grab bars.  Use non-skid mats or decals in the tub or shower.  If you need to sit down in the shower, use a plastic, non-slip stool.  Keep the floor dry. Clean up any water that spills on the floor as soon as it happens.  Remove soap buildup in the tub or shower regularly.  Attach bath mats securely with double-sided non-slip rug tape.  Do not have  throw rugs and other things on the floor that can make you trip. What can I do in the bedroom?  Use night lights.  Make sure that you have a light by your bed that is easy to reach.  Do not use any sheets or blankets that are too big for your bed. They should not hang down onto the floor.  Have a firm chair that has side arms. You can use this for support while you get dressed.  Do not have throw rugs and other things on the floor that can make you trip. What can I do in the kitchen?  Clean up any spills right away.  Avoid walking on wet floors.  Keep items that you use a lot in easy-to-reach places.  If you need to reach something above you, use a strong step stool that has a grab bar.  Keep electrical cords out of the way.  Do not use floor polish or wax that makes floors slippery. If you must use wax, use non-skid floor wax.  Do not have throw rugs and other things on the floor that can make you trip. What can I do with my stairs?  Do not leave any items on the  stairs.  Make sure that there are handrails on both sides of the stairs and use them. Fix handrails that are broken or loose. Make sure that handrails are as long as the stairways.  Check any carpeting to make sure that it is firmly attached to the stairs. Fix any carpet that is loose or worn.  Avoid having throw rugs at the top or bottom of the stairs. If you do have throw rugs, attach them to the floor with carpet tape.  Make sure that you have a light switch at the top of the stairs and the bottom of the stairs. If you do not have them, ask someone to add them for you. What else can I do to help prevent falls?  Wear shoes that:  Do not have high heels.  Have rubber bottoms.  Are comfortable and fit you well.  Are closed at the toe. Do not wear sandals.  If you use a stepladder:  Make sure that it is fully opened. Do not climb a closed stepladder.  Make sure that both sides of the stepladder are  locked into place.  Ask someone to hold it for you, if possible.  Clearly mark and make sure that you can see:  Any grab bars or handrails.  First and last steps.  Where the edge of each step is.  Use tools that help you move around (mobility aids) if they are needed. These include:  Canes.  Walkers.  Scooters.  Crutches.  Turn on the lights when you go into a dark area. Replace any light bulbs as soon as they burn out.  Set up your furniture so you have a clear path. Avoid moving your furniture around.  If any of your floors are uneven, fix them.  If there are any pets around you, be aware of where they are.  Review your medicines with your doctor. Some medicines can make you feel dizzy. This can increase your chance of falling. Ask your doctor what other things that you can do to help prevent falls. This information is not intended to replace advice given to you by your health care provider. Make sure you discuss any questions you have with your health care provider. Document Released: 06/22/2009 Document Revised: 02/01/2016 Document Reviewed: 09/30/2014 Elsevier Interactive Patient Education  2017 [employer].

## 2021-02-14 NOTE — Progress Notes (Signed)
Subjective:   Crystal Levy is a 80 y.o. female who presents for an Initial Medicare Annual Wellness Visit.  Review of Systems    n/a       Objective:    There were no vitals filed for this visit. There is no height or weight on file to calculate BMI.  Advanced Directives 12/21/2014 12/09/2014  Does Patient Have a Medical Advance Directive? Yes Yes  Type of Paramedic of [place];Living will -  Does patient want to make changes to medical advance directive? No - Patient declined -  Copy of [place] in Chart? No - copy requested No - copy requested    Current Medications (verified) Outpatient Encounter Medications as of 02/14/2021  Medication Sig  . alendronate (FOSAMAX) 70 MG tablet Take 1 tablet (70 mg total) by mouth every 7 (seven) days. Take with a full glass of water on an empty stomach.  . Alpha-Lipoic Acid 100 MG TABS Take by mouth.  Marland Kitchen b complex vitamins tablet Take 1 tablet by mouth daily.  . benazepril (LOTENSIN) 10 MG tablet TAKE 1 TABLET BY MOUTH  DAILY  . Biotin 1000 MCG tablet Take 1,000 mcg by mouth daily.  . calcium carbonate (OSCAL) 1500 (600 Ca) MG TABS tablet Take by mouth.  . Cholecalciferol (VITAMIN D-3) 1000 UNITS CAPS Take 2 capsules by mouth daily.  . citalopram (CELEXA) 20 MG tablet TAKE 1 TABLET BY MOUTH  DAILY  . co-enzyme Q-10 50 MG capsule Take 50 mg by mouth daily.  Marland Kitchen DIGESTIVE ENZYMES PO Take by mouth.  . levothyroxine (SYNTHROID) 88 MCG tablet TAKE 1 TABLET BY MOUTH  DAILY BEFORE BREAKFAST  . LORazepam (ATIVAN) 1 MG tablet TAKE 1 TABLET BY MOUTH AT BEDTIME AS NEEDED FOR ANXIETY  . Magnesium 100 MG CAPS Take by mouth.  . magnesium hydroxide (MILK OF MAGNESIA) 400 MG/5ML suspension Take by mouth.  . Milk Thistle Extract 175 MG TABS Take by mouth.  . Multiple Vitamins-Minerals (MULTIVITAL) tablet Take 1 tablet by mouth daily.  . NON FORMULARY Place under the tongue. [place] Flower Essence  .  pantoprazole (PROTONIX) 40 MG tablet TAKE 1 TABLET BY MOUTH  DAILY  . potassium gluconate 595 MG TABS tablet Take 595 mg by mouth daily.  . Prenatal Vit-Fe Fumarate-FA (PRENATAL VITAMIN PO) Take by mouth.  . rosuvastatin (CRESTOR) 5 MG tablet TAKE 1 TABLET BY MOUTH  DAILY  . Selenium (SELENIMIN PO) Take by mouth daily.   No facility-administered encounter medications on file as of 02/14/2021.    Allergies (verified) Clindamycin/lincomycin, Fluticasone, Azithromycin, Eggs or egg-derived products, Milk-related compounds, Penicillins, Sulfa antibiotics, Sulfamethoxazole-trimethoprim, and Fenofibrate   History: Past Medical History:  Diagnosis Date  . Anxiety   . Arthritis   . Bicuspid aortic valve   . Depression   . Hypertension   . Hypothyroidism   . Polio   . Thyroid disease    Past Surgical History:  Procedure Laterality Date  . ABDOMINAL HYSTERECTOMY    . BREAST ENHANCEMENT SURGERY    . BREAST SURGERY    . BUNIONECTOMY    . CARDIAC CATHETERIZATION    . EYE SURGERY    . HAMMER TOE SURGERY    . HERNIA REPAIR    . JOINT REPLACEMENT     left hip  . KNEE SURGERY     meniscus  . RADICAL VAGINAL HYSTERECTOMY  1993  . TONSILLECTOMY    . 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 CAD Mother 6  . Aortic stenosis Mother    Social History   Socioeconomic History  . Marital status: Divorced    Spouse name: Not on file  . Number of children: 2  . Years of education: Not on file  . Highest education level: Not on file  Occupational History    Employer: [employer]  Tobacco Use  . Smoking status: Never Smoker  . Smokeless tobacco: Never Used  . Tobacco comment: Rare smoker in the distant past.   Substance and Sexual Activity  . Alcohol use: Yes    Alcohol/week: 0.0 standard drinks     Comment: wine  . Drug use: No  . Sexual activity: Not on file  Other Topics Concern  . 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    Social Determinants of Health   Financial Resource Strain: Not on file  Food Insecurity: Not on file  Transportation Needs: Not on file  Physical Activity: Not on file  Stress: Not on file  Social Connections: Not on file    Tobacco Counseling Counseling given: Not Answered Comment: Rare smoker in the distant past.    Clinical Intake:                 Diabetic?no         Activities of Daily Living In your present state of health, do you have any difficulty performing the following activities: 03/21/2020  Hearing? N  Vision? N  Difficulty concentrating or making decisions? N  Walking or climbing stairs? N  Dressing or bathing? N  Doing errands, shopping? N  Some recent data might be hidden    Patient Care Team: [place], Betty G, MD as PCP - General (Family Medicine) Viona Gilmore, [facility] as Pharmacist (Pharmacist)  Indicate any recent Medical Services you may have received from other than [facility] providers in the past year (date may be approximate).     Assessment:   This is a routine wellness examination for [place].  Hearing/Vision screen No exam data present  Dietary issues and exercise activities discussed:    Goals Addressed   None    Depression Screen PHQ 2/9 Scores 02/06/2020  PHQ - 2 Score 1  PHQ- 9 Score 8    Fall Risk Fall Risk  08/30/2019  Falls in the past year? 1  Number falls in past yr: 1  Injury with Fall? 0  Risk for fall due to : Impaired balance/gait;History of fall(s);Orthopedic patient  Follow up Education provided    FALL RISK PREVENTION PERTAINING TO THE HOME:  Any stairs in or around the home? No  If so, are there any without handrails? No  Home free of loose throw rugs in walkways, pet beds, electrical cords, etc? Yes  Adequate  lighting in your home to reduce risk of falls? Yes   ASSISTIVE DEVICES UTILIZED TO PREVENT FALLS:  Life alert? No  Use of a cane, walker or w/c? Yes  Grab bars in the bathroom? Yes  Shower chair or bench in shower? Yes  Elevated toilet seat or a handicapped toilet? Yes   TIMED UP AND GO:  Was the test performed? Yes .  Length of time to ambulate 10 feet: 10 sec.   Gait unsteady without use of  assistive device, provider informed and interventions were implemented  Cognitive Function:        Immunizations Immunization History  Administered Date(s) Administered  . Influenza, High Dose Seasonal PF 08/31/2018, 06/05/2020  . PFIZER(Purple Top)SARS-COV-2 Vaccination 09/10/2019, 10/21/2019, 06/19/2020  . Pneumococcal-Unspecified 09/09/2008  . Tdap 09/09/2009  . Zoster, Live 09/09/2009    TDAP status: Due, Education has been provided regarding the importance of this vaccine. Advised may receive this vaccine at local pharmacy or Health Dept. Aware to provide a copy of the vaccination record if obtained from local pharmacy or Health Dept. Verbalized acceptance and understanding.  Flu Vaccine status: Up to date  Pneumococcal vaccine status: Up to date  Covid-19 vaccine status: Completed vaccines  Qualifies for Shingles Vaccine? Yes   Zostavax completed No   Shingrix Completed?: Yes  Screening Tests Health Maintenance  Topic Date Due  . Pneumococcal Vaccine 83-51 Years old (1 of 4 - PCV13) Never done  . Zoster Vaccines- Shingrix (1 of 2) Never done  . COVID-19 Vaccine (4 - Booster for Anderson series) 09/19/2020  . INFLUENZA VACCINE  04/09/2021  . DEXA SCAN  Completed  . HPV VACCINES  Aged Out  . TETANUS/TDAP  Discontinued  . PNA vac Low Risk Adult  Discontinued    Health Maintenance  Health Maintenance Due  Topic Date Due  . Pneumococcal Vaccine 54-50 Years old (1 of 4 - PCV13) Never done  . Zoster Vaccines- Shingrix (1 of 2) Never done  . COVID-19 Vaccine (4 - Booster  for Pfizer series) 09/19/2020    Colorectal cancer screening: Type of screening: Colonoscopy. Completed 05/13/2019. Repeat every 5 years  Mammogram status: No longer required due to age.  Bone Density status: Completed 11/10/2020. Results reflect: Bone density results: OSTEOPOROSIS. Repeat every 2 years.  Lung Cancer Screening: (Low Dose CT Chest recommended if Age 29-80 years, 30 pack-year currently smoking OR have quit w/in 15years.) does not qualify.   Lung Cancer Screening Referral: n/a  Additional Screening:  Hepatitis C Screening: does not qualify  Vision Screening: Recommended annual ophthalmology exams for early detection of glaucoma and other disorders of the eye. Is the patient up to date with their annual eye exam?  Yes  Who is the provider or what is the name of the office in which the patient attends annual eye exams? Dr.Tanner If pt is not established with a provider, would they like to be referred to a provider to establish care? No .   Dental Screening: Recommended annual dental exams for proper oral hygiene  Community Resource Referral / Chronic Care Management: CRR required this visit?  No   CCM required this visit?  No      Plan:     I have personally reviewed and noted the following in the patient's chart:   . Medical and social history . Use of alcohol, tobacco or illicit drugs  . Current medications and supplements including opioid prescriptions. Patient is not currently taking opioid prescriptions. . Functional ability and status . Nutritional status . Physical activity . Advanced directives . List of other physicians . Hospitalizations, surgeries, and ER visits in previous 12 months . Vitals . Screenings to include cognitive, depression, and falls . Referrals and appointments  In addition, I have reviewed and discussed with patient certain preventive protocols, quality metrics, and best practice recommendations. A written personalized care plan  for preventive services as well as general preventive health recommendations were provided to patient.     Randel Pigg, LPN  02/14/2021   Nurse Notes: none

## 2021-02-15 DIAGNOSIS — R262 Difficulty in walking, not elsewhere classified: Secondary | ICD-10-CM | POA: Diagnosis not present

## 2021-02-15 DIAGNOSIS — Z96652 Presence of left artificial knee joint: Secondary | ICD-10-CM | POA: Diagnosis not present

## 2021-02-15 DIAGNOSIS — M25662 Stiffness of left knee, not elsewhere classified: Secondary | ICD-10-CM | POA: Diagnosis not present

## 2021-02-20 DIAGNOSIS — Z96652 Presence of left artificial knee joint: Secondary | ICD-10-CM | POA: Diagnosis not present

## 2021-02-20 DIAGNOSIS — M25662 Stiffness of left knee, not elsewhere classified: Secondary | ICD-10-CM | POA: Diagnosis not present

## 2021-02-20 DIAGNOSIS — R262 Difficulty in walking, not elsewhere classified: Secondary | ICD-10-CM | POA: Diagnosis not present

## 2021-02-23 DIAGNOSIS — R262 Difficulty in walking, not elsewhere classified: Secondary | ICD-10-CM | POA: Diagnosis not present

## 2021-02-23 DIAGNOSIS — Z96652 Presence of left artificial knee joint: Secondary | ICD-10-CM | POA: Diagnosis not present

## 2021-02-23 DIAGNOSIS — M25662 Stiffness of left knee, not elsewhere classified: Secondary | ICD-10-CM | POA: Diagnosis not present

## 2021-02-23 DIAGNOSIS — M21371 Foot drop, right foot: Secondary | ICD-10-CM | POA: Diagnosis not present

## 2021-02-27 ENCOUNTER — Telehealth: Payer: Self-pay | Admitting: Pharmacist

## 2021-02-27 DIAGNOSIS — M25662 Stiffness of left knee, not elsewhere classified: Secondary | ICD-10-CM | POA: Diagnosis not present

## 2021-02-27 DIAGNOSIS — Z96652 Presence of left artificial knee joint: Secondary | ICD-10-CM | POA: Diagnosis not present

## 2021-02-27 DIAGNOSIS — R262 Difficulty in walking, not elsewhere classified: Secondary | ICD-10-CM | POA: Diagnosis not present

## 2021-02-27 NOTE — Chronic Care Management (AMB) (Signed)
Chronic Care Management Pharmacy Assistant   Name: Crystal Levy  MRN: [id number] DOB: 05/15/1941  Crystal Levy is an 80 y.o. year old female who presents for herinitial CCM visit with the clinical pharmacist.  Reason for Encounter: Chart Prep for initial visit on 03/02/21 with [name] the clinical pharmacist.    Conditions to be addressed/monitored: HTN, HLD, Anxiety, and GERD, Hypothyroidism , Peripheral artery disease and Aortic stenosis.   Recent office visits:  12/25/20 [name] MD (PCP) - seen for frequent falls and other issues. No medication changes and referral placed to home health. Follow up in 5 months.   10/27/20 [name] MD (PCP) - seen for actinic keratosis and other issues. No medication changes. Follow up in 8 weeks.   08/11/20 [name] MD (PCP) - seen for general adult medical examinations with abnormal findings and other chronic conditions. Changed citalopram from 10mg  daily to 20mg  daily. Follow up in 4 months.  Recent consult visits:  None.   Hospital visits:  None in previous 6 months  Fill History:  ALENDRONATE 70MG     TAB 02/11/2021 28   BENAZEPRIL HCL  10 MG TABS 08/17/2020 90   CITALOPRAM HYDROBROMIDE  20 MG TABS 08/11/2020 90   LORazepam 1MG        TAB 02/03/2021 30   LEVOTHYROXINE SODIUM  88 MCG TABS 08/17/2020 90   PANTOPRAZOLE SODIUM  40 MG TBEC 10/05/2020 90   ROSUVASTATIN CALCIUM  5 MG TABS 09/07/2020 90    Medications: Outpatient Encounter Medications as of 02/27/2021  Medication Sig   alendronate (FOSAMAX) 70 MG tablet Take 1 tablet (70 mg total) by mouth every 7 (seven) days. Take with a full glass of water on an empty stomach.   Alpha-Lipoic Acid 100 MG TABS Take by mouth.   b complex vitamins tablet Take 1 tablet by mouth daily.   benazepril (LOTENSIN) 10 MG tablet TAKE 1 TABLET BY MOUTH  DAILY   Biotin 1000 MCG tablet Take 1,000 mcg by mouth daily.   calcium carbonate (OSCAL) 1500 (600 Ca) MG TABS tablet  Take by mouth. (Patient not taking: Reported on 02/14/2021)   Cholecalciferol (VITAMIN D-3) 1000 UNITS CAPS Take 2 capsules by mouth daily.   citalopram (CELEXA) 20 MG tablet TAKE 1 TABLET BY MOUTH  DAILY   co-enzyme Q-10 50 MG capsule Take 50 mg by mouth daily.   DIGESTIVE ENZYMES PO Take by mouth.   levothyroxine (SYNTHROID) 88 MCG tablet TAKE 1 TABLET BY MOUTH  DAILY BEFORE BREAKFAST   LORazepam (ATIVAN) 1 MG tablet TAKE 1 TABLET BY MOUTH AT BEDTIME AS NEEDED FOR ANXIETY   Magnesium 100 MG CAPS Take by mouth.   magnesium hydroxide (MILK OF MAGNESIA) 400 MG/5ML suspension Take by mouth.   Milk Thistle Extract 175 MG TABS Take by mouth.   Multiple Vitamins-Minerals (MULTIVITAL) tablet Take 1 tablet by mouth daily.   NON FORMULARY Place under the tongue. [place] Flower Essence   pantoprazole (PROTONIX) 40 MG tablet TAKE 1 TABLET BY MOUTH  DAILY   potassium gluconate 595 MG TABS tablet Take 595 mg by mouth daily.   Prenatal Vit-Fe Fumarate-FA (PRENATAL VITAMIN PO) Take by mouth.   rosuvastatin (CRESTOR) 5 MG tablet TAKE 1 TABLET BY MOUTH  DAILY   Selenium (SELENIMIN PO) Take by mouth daily.   No facility-administered encounter medications on file as of 02/27/2021.    Have you seen any other providers since your last visit?  Yes. She saw Mitzi Hansen and  Eric two different PA-Cs at [facility] for physical therapy twice a week.  Any changes in your medications or health?  Yes. Patient has fallen 5/16, 5/28, 6/1 fall she face planted. Patient recently got a walker and a quad cane. Her children are paying to get her a life alert necklace.  Any side effects from any medications?  Yes. Patient states since she's started Rosuvastatin she has gotten headaches and she's never had them before and she would actually like to not take it.  Do you have an symptoms or problems not managed by your medications?  No. Patient feels her medications are helping pretty well. Patient stated she is not taking  Pantoprazole anymore because she looked it up and it said she is not suppose to be on it longer than 8 weeks.  Any concerns about your health right now?  Yes. Patient feels herself getting older and her falling a lot concerns her. Patients balance gets off for some reason. Patient carries her cell phone with her everywhere she goes.  Has your provider asked that you check blood pressure, blood sugar, or follow special diet at home?  No. Patient states none of her doctors have asked her to keep a check on anything or follow any type of certain eating schedule or diet. Patient states she has added a protein smoothie to her diet every morning.  Do you get any type of exercise on a regular basis?  Yes. Patient walks everyday in her neighborhood with her can for 30 minutes everyday and she does bed exercises. Patient is also doing physical therapy twice a week. She uses a stretch band and 3 pound weights everyday.  Can you think of a goal you would like to reach for your health?  Patient would like to lose some weight and improve her balance for less falls.  Do you have any problems getting your medications?  No. Patient has no issues getting her medications from pharmacy or paying for them.  Is there anything that you would like to discuss during the appointment? Yes. Patient would like to discuss her falls and going off the rosuvastatin. Patient also has not been taking the pantoprazole due to reading she's only suppose to be on it 8 weeks. Patient is weary out taking the baby aspirin as well.   Please bring medications and supplements to appointment.  Star Rating Drugs:  Benazepril 10mg  - take once daily.  Rosuvastatin 5mg  - take once daily.   [place]  Clinical Pharmacist Assistant [phone]

## 2021-03-01 DIAGNOSIS — M25662 Stiffness of left knee, not elsewhere classified: Secondary | ICD-10-CM | POA: Diagnosis not present

## 2021-03-01 DIAGNOSIS — R262 Difficulty in walking, not elsewhere classified: Secondary | ICD-10-CM | POA: Diagnosis not present

## 2021-03-01 DIAGNOSIS — Z96652 Presence of left artificial knee joint: Secondary | ICD-10-CM | POA: Diagnosis not present

## 2021-03-02 ENCOUNTER — Other Ambulatory Visit: Payer: Self-pay

## 2021-03-02 ENCOUNTER — Ambulatory Visit (INDEPENDENT_AMBULATORY_CARE_PROVIDER_SITE_OTHER): Payer: Medicare Other | Admitting: Pharmacist

## 2021-03-02 DIAGNOSIS — E785 Hyperlipidemia, unspecified: Secondary | ICD-10-CM | POA: Diagnosis not present

## 2021-03-02 DIAGNOSIS — I10 Essential (primary) hypertension: Secondary | ICD-10-CM | POA: Diagnosis not present

## 2021-03-02 DIAGNOSIS — I739 Peripheral vascular disease, unspecified: Secondary | ICD-10-CM

## 2021-03-02 NOTE — Progress Notes (Signed)
Chronic Care Management Pharmacy Note  04/01/2021 Name:  Crystal Levy MRN:  [id number] DOB:  [birth year]  Summary: LDL not at goal < 70 Patient stopped taking rosuvastatin and pantoprazole on her own  Recommendations/Changes made from today's visit: -Recommended taking aspirin daily due to benefits given PAD diagnosis -Recommend switching to pravastatin due to lower risk of side effects and ASCVD benefit  Plan: Follow up in 3-4 weeks for tolerance assessment of pravastatin   Subjective: Crystal Levy is an 80 y.o. year old female who is a primary patient of Martinique, Malka So, MD.  The CCM team was consulted for assistance with disease management and care coordination needs.    Engaged with patient face to face for initial visit in response to provider referral for pharmacy case management and/or care coordination services.   Consent to Services:  The patient was given the following information about Chronic Care Management services today, agreed to services, and gave verbal consent: 1. CCM service includes personalized support from designated clinical staff supervised by the primary care provider, including individualized plan of care and coordination with other care providers 2. 24/7 contact phone numbers for assistance for urgent and routine care needs. 3. Service will only be billed when office clinical staff spend 20 minutes or more in a month to coordinate care. 4. Only one practitioner may furnish and bill the service in a calendar month. 5.The patient may stop CCM services at any time (effective at the end of the month) by phone call to the office staff. 6. The patient will be responsible for cost sharing (co-pay) of up to 20% of the service fee (after annual deductible is met). Patient agreed to services and consent obtained.  Patient Care Team: Martinique, Betty G, MD as PCP - General (Family Medicine) Viona Gilmore, [facility] as Pharmacist (Pharmacist)  Recent office  visits: 02/14/21 Randel Pigg, LPN: Patient presented for AWV.  12/25/20 [name] MD (PCP) - seen for frequent falls and other issues. No medication changes and referral placed to home health. Follow up in 5 months.   10/27/20 [name] MD (PCP) - seen for actinic keratosis and other issues. No medication changes. Follow up in 8 weeks.   08/11/20 [name] MD (PCP) - seen for general adult medical examinations with abnormal findings and other chronic conditions. Changed citalopram from $RemoveBeforeD'10mg'nFBLdtVgfLuCUp$  daily to $Remove'20mg'pczcvjB$  daily. Follow up in 4 months.  Recent consult visits: 10/23/20 Blair Heys (optometry): Unable to access notes.  Hospital visits: None in previous 6 months   Objective:  Lab Results  Component Value Date   CREATININE 0.59 12/26/2020   BUN 13 12/26/2020   GFR 85.35 12/26/2020   GFRNONAA >90 12/09/2014   GFRAA >90 12/09/2014   NA 139 12/26/2020   K 4.2 12/26/2020   CALCIUM 8.8 12/26/2020   CO2 32 12/26/2020   GLUCOSE 97 12/26/2020    Lab Results  Component Value Date/Time   GFR 85.35 12/26/2020 08:02 AM   GFR 113.72 02/01/2020 10:29 AM    Last diabetic Eye exam: No results found for: HMDIABEYEEXA  Last diabetic Foot exam: No results found for: HMDIABFOOTEX   Lab Results  Component Value Date   CHOL 173 12/26/2020   HDL 50.80 12/26/2020   LDLDIRECT 111.0 12/26/2020   TRIG 201.0 (H) 12/26/2020   CHOLHDL 3 12/26/2020    Hepatic Function Latest Ref Rng & Units 12/26/2020  Total Protein 6.0 - 8.3 g/dL 6.1  Albumin 3.5 - 5.2 g/dL 3.9  AST 0 - 37 U/L 16  ALT 0 - 35 U/L 12  Alk Phosphatase 39 - 117 U/L 103  Total Bilirubin 0.2 - 1.2 mg/dL 0.6    Lab Results  Component Value Date/Time   TSH 3.11 12/26/2020 08:02 AM   TSH 0.50 02/01/2020 10:29 AM    CBC Latest Ref Rng & Units 12/09/2014 02/19/2012  WBC 4.0 - 10.5 K/uL 6.4 8.5  Hemoglobin 12.0 - 15.0 g/dL 13.9 14.3  Hematocrit 36.0 - 46.0 % 41.9 42.5  Platelets 150 - 400 K/uL 298 300    No results found for:  VD25OH  Clinical ASCVD: No  The ASCVD Risk score Mikey Bussing DC Jr., et al., 2013) failed to calculate for the following reasons:   The 2013 ASCVD risk score is only valid for ages 70 to 3    Depression screen PHQ 2/9 02/14/2021 02/14/2021 02/06/2020  Decreased Interest 0 0 0  Down, Depressed, Hopeless 0 0 1  PHQ - 2 Score 0 0 1  Altered sleeping - - 2  Tired, decreased energy - - 2  Change in appetite - - 0  Feeling bad or failure about yourself  - - 0  Trouble concentrating - - 3  Moving slowly or fidgety/restless - - 0  Suicidal thoughts - - 0  PHQ-9 Score - - 8  Difficult doing work/chores - - Somewhat difficult      Social History   Tobacco Use  Smoking Status Never  Smokeless Tobacco Never  Tobacco Comments   Rare smoker in the distant past.    BP Readings from Last 3 Encounters:  02/14/21 138/64  12/25/20 136/72  10/27/20 132/70   Pulse Readings from Last 3 Encounters:  02/14/21 63  12/25/20 69  10/27/20 78   Wt Readings from Last 3 Encounters:  02/14/21 150 lb (68 kg)  10/27/20 147 lb (66.7 kg)  08/11/20 148 lb (67.1 kg)   BMI Readings from Last 3 Encounters:  02/14/21 24.96 kg/m  12/25/20 23.73 kg/m  10/27/20 23.73 kg/m    Assessment/Interventions: Review of patient past medical history, allergies, medications, health status, including review of consultants reports, laboratory and other test data, was performed as part of comprehensive evaluation and provision of chronic care management services.   SDOH:  (Social Determinants of Health) assessments and interventions performed: Yes SDOH Interventions    Flowsheet Row Most Recent Value  SDOH Interventions   Financial Strain Interventions Intervention Not Indicated  Transportation Interventions Intervention Not Indicated      SDOH Screenings   Alcohol Screen: Low Risk    Last Alcohol Screening Score (AUDIT): 1  Depression (PHQ2-9): Low Risk    PHQ-2 Score: 0  Financial Resource Strain: Low Risk     Difficulty of Paying Living Expenses: Not hard at all  Food Insecurity: No Food Insecurity   Worried About Charity fundraiser in the Last Year: Never true   Ran Out of Food in the Last Year: Never true  Housing: Low Risk    Last Housing Risk Score: 0  Physical Activity: Inactive   Days of Exercise per Week: 0 days   Minutes of Exercise per Session: 0 min  Social Connections: Socially Isolated   Frequency of Communication with Friends and Family: Twice a week   Frequency of Social Gatherings with Friends and Family: Twice a week   Attends Religious Services: Never   Marine scientist or Organizations: No   Attends Archivist Meetings: Never   Marital  Status: Divorced  Stress: No Stress Concern Present   Feeling of Stress : Not at all  Tobacco Use: Low Risk    Smoking Tobacco Use: Never   Smokeless Tobacco Use: Never  Transportation Needs: No Transportation Needs   Lack of Transportation (Medical): No   Lack of Transportation (Non-Medical): No   Patient is retired and every morning she usually does 7 minutes or so of stretching, takes medicines, then eats breakfast and then uses the computer. For breakfast, she is typically eating smoothies with bananas and blueberries, milk and protein powder, almonds, and coffee. She also tries to do errands in the morning and lately has been going to PT twice a week.  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 She also has a friend with ALS who she enjoys visiting once a week. She has also been walking almost every day 15-20 minutes in her neighborhood, doing shoulder exercises. She also enjoys spending her time reading.   Patient has one son and grandson and sees them at family events but not frequently.  Patient tries to eat a green salad every day and also does meal prepping. She will make soup, chili, stews,and not much beef or pork, but does eat fish every week. She is  allergic to cheese and eggs.   She has been sleeping better lately, as she was waking up at 3-4 in the morning before due to restlessness. Sometimes she cannot go back to sleep , she feels tired during the day but does not often take naps.  Patient denies any issues with medications other than rosuvastatin, which is causing headaches for her. She also stopped taking pantoprazole on her own because she read that she shouldn't take it for more than 8 weeks.  CCM Care Plan  Allergies  Allergen Reactions   Clindamycin/Lincomycin Diarrhea and Other (See Comments)    C-Diff   Fluticasone     Headache and nosebleed    Azithromycin    Eggs Or Egg-Derived Products    Milk-Related Compounds     Unknown reaction   Penicillins Rash   Sulfa Antibiotics Diarrhea   Sulfamethoxazole-Trimethoprim    Fenofibrate Rash    Medications Reviewed Today     Reviewed by Viona Gilmore, [facility] (Pharmacist) on 03/02/21 at 1000  Med List Status: <None>   Medication Order Taking? Sig Documenting Provider Last Dose Status Informant  alendronate (FOSAMAX) 70 MG tablet [id number] Yes Take 1 tablet (70 mg total) by mouth every 7 (seven) days. Take with a full glass of water on an empty stomach. Martinique, Betty G, MD Taking Active   Alpha-Lipoic Acid 100 MG TABS [id number] Yes Take by mouth. [provider] Taking Active   b complex vitamins tablet [id number] Yes Take 1 tablet by mouth daily. [provider] Taking Active   benazepril (LOTENSIN) 10 MG tablet [id number] Yes TAKE 1 TABLET BY MOUTH  DAILY Martinique, Betty G, MD Taking Active   calcium carbonate (OSCAL) 1500 (600 Ca) MG TABS tablet [id number] No Take by mouth.  Patient not taking: Reported on 03/02/2021   [provider] Not Taking Active   Cholecalciferol (VITAMIN D-3) 1000 UNITS CAPS [id number] Yes Take 2 capsules by mouth daily. [provider] Taking Active   citalopram (CELEXA) 20 MG tablet [id number] Yes TAKE 1 TABLET BY  MOUTH  DAILY Martinique, Betty G, MD Taking Active   co-enzyme Q-10 50 MG capsule [id number] Yes Take  50 mg by mouth daily. [provider] Taking Active   DIGESTIVE ENZYMES PO [id number] Yes Take by mouth. [provider] Taking Active   levothyroxine (SYNTHROID) 88 MCG tablet [id number] Yes TAKE 1 TABLET BY MOUTH  DAILY BEFORE BREAKFAST Martinique, Betty G, MD Taking Active   LORazepam (ATIVAN) 1 MG tablet [id number] Yes TAKE 1 TABLET BY MOUTH AT BEDTIME AS NEEDED FOR ANXIETY Martinique, Betty G, MD Taking Active   Magnesium 100 MG CAPS [id number] Yes Take by mouth. [provider] Taking Active   Milk Thistle Extract 175 MG TABS [id number] Yes Take by mouth. [provider] Taking Active   Baruch Gouty [id number] Yes Place under the tongue. Sam Rayburn Essence [provider] Taking Active   Polyethylene Glycol 3350 (MIRALAX PO) [id number] Yes Take by mouth 2 (two) times a week. [provider] Taking Active   potassium gluconate 595 MG TABS tablet [id number] Yes Take 595 mg by mouth daily. [provider] Taking Active   rosuvastatin (CRESTOR) 5 MG tablet [id number] Yes TAKE 1 TABLET BY MOUTH  DAILY Martinique, Betty G, MD Taking Active   Selenium (SELENIMIN PO) [id number] Yes Take by mouth daily. [provider] Taking Active             Patient Active Problem List   Diagnosis Date Noted   Generalized osteoarthritis of multiple sites 04/11/2020   Insomnia 03/21/2020   GERD (gastroesophageal reflux disease) 02/01/2020   Anxiety disorder, unspecified 01/31/2020   PAD (peripheral artery disease) ([place]) 01/31/2020   Hyperlipidemia 08/30/2019   Hypothyroidism (acquired) 03/01/2019   Primary osteoarthritis of left knee 12/20/2014   Primary osteoarthritis of knee 12/20/2014   HTN (hypertension) 04/29/2013   Aortic stenosis 02/06/2013    Immunization History  Administered Date(s) Administered   Influenza, High Dose Seasonal PF  08/31/2018, 06/05/2020   PFIZER(Purple Top)SARS-COV-2 Vaccination 09/10/2019, 10/21/2019, 06/19/2020, 12/08/2020   Pneumococcal Conjugate-13 01/27/2014   Pneumococcal Polysaccharide-23 09/10/2007   Pneumococcal-Unspecified 09/09/2008   Tdap 09/09/2009, 01/16/2010   Zoster Recombinat (Shingrix) 12/26/2016, 04/12/2017   Zoster, Live 09/09/2009, 03/15/2013    Conditions to be addressed/monitored:  Hypertension, Hyperlipidemia, GERD, Hypothyroidism, Anxiety, Osteoporosis, Osteoarthritis, and Insomnia  Care Plan : [place]  Updates made by Viona Gilmore, [place] since 04/01/2021 12:00 AM     Problem: Problem: Hypertension, Hyperlipidemia, GERD, Hypothyroidism, Anxiety, Osteoporosis, Osteoarthritis, and Insomnia      Long-Range Goal: Patient-Specific Goal   Start Date: 03/02/2021  Expected End Date: 03/02/2022  This Visit's Progress: On track  Priority: High  Note:   Current Barriers:  Unable to independently monitor therapeutic efficacy Unable to achieve control of cholesterol   Pharmacist Clinical Goal(s):  Patient will achieve adherence to monitoring guidelines and medication adherence to achieve therapeutic efficacy achieve control of cholesterol as evidenced by next lipid panel  through collaboration with PharmD and provider.   Interventions: 1:1 collaboration with Martinique, Betty G, MD regarding development and update of comprehensive plan of care as evidenced by provider attestation and co-signature Inter-disciplinary care team collaboration (see longitudinal plan of care) Comprehensive medication review performed; medication list updated in electronic medical record  Hypertension (BP goal <140/90) -Controlled -Current treatment: Benazepril 10 mg 1 tablet daily -Medications previously tried: none  -Current home readings: no BP machine at home -Current dietary habits: eating leaner meat and fruits and vegetables -Current exercise habits: walking and doing PT  exercises -Denies hypotensive/hypertensive symptoms -Educated on BP goals and benefits of medications for prevention of heart attack,  stroke and kidney damage; Exercise goal of 150 minutes per week; Importance of home blood pressure monitoring; Proper BP monitoring technique; -Counseled to monitor BP at home weekly, document, and provide log at future appointments -Counseled on diet and exercise extensively Recommended to continue current medication  Hyperlipidemia: (LDL goal < 70) -Uncontrolled -Current treatment: Rosuvastatin 5 mg 1 tablet daily - not taking -Medications previously tried: none  -Current dietary patterns: not eating fried foods and uses olive oil when cooking; drinks 1 glass of wine 4 nights a week -Current exercise habits: walking and PT exercises -Educated on Cholesterol goals;  Benefits of statin for ASCVD risk reduction; Importance of limiting foods high in cholesterol; Exercise goal of 150 minutes per week; -Counseled on diet and exercise extensively Recommended trial of pravastatin due to lower risk of side effects.  PAD (Goal: prevent heart events and improve circulation) -Controlled -Current treatment  Aspirin 81 mg 1 tablet daily -Medications previously tried: none  -Recommended to continue current medication Counseled on benefits of taking aspirin given PAD indication to prevent heart events.   Depression/Anxiety/Insomnia (Goal: minimize symptoms and improve sleep) -Not ideally controlled -Current treatment: Citalopram 20 mg 1 tablet daily Lorazepam 1 mg 1 tablet at bedtime as needed -Medications previously tried/failed: none -PHQ9: 8 -GAD7: 7 -Educated on Benefits of medication for symptom control -Counseled on risk of falls with use of lorazepam Recommended discussion with PCP about alternative agents for sleep.   Osteopenia (Goal prevent fractures) -Controlled -Last DEXA Scan: 11/17/20  T-Score femoral neck: -2.1  T-Score total hip:  n/a  T-Score lumbar spine: 0.7  T-Score forearm radius: -1.5  10-year probability of major osteoporotic fracture: 16  10-year probability of hip fracture: 4.6% -Patient is not a candidate for pharmacologic treatment -Current treatment  Alendronate 70 mg 1 tablet weekly Sunday morning  Calcium carbonate (unsure of dose) Vitamin D 1000 units 2 capsules daily -Medications previously tried: none  -Recommend [phone] units of vitamin D daily. Recommend 1200 mg of calcium daily from dietary and supplemental sources. Recommend weight-bearing and muscle strengthening exercises for building and maintaining bone density. -Counseled on diet and exercise extensively Recommended to continue current medication Recommended calcium citrate with 600 mg of calcium daily based on intake of calcium from diet.  Hypothyroidism (Goal: TSH 2.5-3.5 based on osteoporosis) -Controlled -Current treatment  Levothyroxine 88 mcg 1 tablet daily before breakfast -Medications previously tried: none  -Recommended to continue current medication  GERD (Goal: minimize symptoms) -Controlled -Current treatment  Pantoprazole 40 mg 1 tablet daily - stopped taking -Medications previously tried: none  -Counseled on duration of therapy with PPIs and long term risks of taking. Patient's heartburn is controlled without it.   Health Maintenance -Vaccine gaps: tetanus -Current therapy:  Coenzyme Q10 50 mg 1 capsule daily Digestive enzymes Magnesium 100 mg 1 capsule daily Multivitamin 1 tablet daily Potassium gluconate 595 mg 1 tablet daily Selenium daily Milk thistle extract 175 mg 1 tablet daily Milk of magnesia as needed Biotin 1000 mcg 1 tablet daily Vitamin B complex daily Alpha lipoic acid 100 mg 1 tablet daily Adrenal metabolism daily Olly skin - hyaluronic acid -Educated on Herbal supplement research is limited and benefits usually cannot be proven Cost vs benefit of each product must be carefully weighed by  individual consumer Supplements may interfere with prescription drugs -Patient is satisfied with current therapy and denies issues -Recommended to continue current medication  Patient Goals/Self-Care Activities Patient will:  - take medications as prescribed check blood pressure weekly, document,  and provide at future appointments target a minimum of 150 minutes of moderate intensity exercise weekly  Follow Up Plan: The care management team will reach out to the patient again over the next 30 days.        Medication Assistance: None required.  Patient affirms current coverage meets needs.  Compliance/Adherence/Medication fill history: Care Gaps: None  Star-Rating Drugs: Benazepril 10 mg - last filled 11/10/20 for 90 ds Rosuvastatin 5 mg  - last filled 12/01/20 for 90 ds  Patient's preferred pharmacy is:  [facility] [address], [place] - [place] [address] [place] [place] [address] Phone: [phone] Fax: [phone]  [facility]  ([place], [address] [place] [address] Phone: [phone] Fax: [phone]  Uses pill box? No - night medications are by her bed and morning ones are on top of her dresser  We discussed: Current pharmacy is preferred with insurance plan and patient is satisfied with pharmacy services Patient decided to: Continue current medication management strategy  Care Plan and Follow Up Patient Decision:  Patient agrees to Care Plan and Follow-up.  Plan: The care management team will reach out to the patient again over the next 30 days.  [name], PharmD, [place] Pharmacist [place] [facility] [place]

## 2021-03-05 ENCOUNTER — Other Ambulatory Visit: Payer: Self-pay | Admitting: Family Medicine

## 2021-03-05 DIAGNOSIS — Z96652 Presence of left artificial knee joint: Secondary | ICD-10-CM | POA: Diagnosis not present

## 2021-03-05 DIAGNOSIS — F419 Anxiety disorder, unspecified: Secondary | ICD-10-CM

## 2021-03-05 DIAGNOSIS — R262 Difficulty in walking, not elsewhere classified: Secondary | ICD-10-CM | POA: Diagnosis not present

## 2021-03-05 DIAGNOSIS — M25662 Stiffness of left knee, not elsewhere classified: Secondary | ICD-10-CM | POA: Diagnosis not present

## 2021-03-05 NOTE — Telephone Encounter (Signed)
Last filled: 02/03/21 Last OV: 12/25/20 Next OV: 05/28/21

## 2021-03-07 DIAGNOSIS — R262 Difficulty in walking, not elsewhere classified: Secondary | ICD-10-CM | POA: Diagnosis not present

## 2021-03-07 DIAGNOSIS — Z96652 Presence of left artificial knee joint: Secondary | ICD-10-CM | POA: Diagnosis not present

## 2021-03-07 DIAGNOSIS — M25662 Stiffness of left knee, not elsewhere classified: Secondary | ICD-10-CM | POA: Diagnosis not present

## 2021-03-16 ENCOUNTER — Other Ambulatory Visit: Payer: Self-pay | Admitting: Family Medicine

## 2021-03-20 DIAGNOSIS — M9901 Segmental and somatic dysfunction of cervical region: Secondary | ICD-10-CM | POA: Diagnosis not present

## 2021-03-20 DIAGNOSIS — M9904 Segmental and somatic dysfunction of sacral region: Secondary | ICD-10-CM | POA: Diagnosis not present

## 2021-03-20 DIAGNOSIS — M9905 Segmental and somatic dysfunction of pelvic region: Secondary | ICD-10-CM | POA: Diagnosis not present

## 2021-03-20 DIAGNOSIS — M9903 Segmental and somatic dysfunction of lumbar region: Secondary | ICD-10-CM | POA: Diagnosis not present

## 2021-03-22 DIAGNOSIS — M9901 Segmental and somatic dysfunction of cervical region: Secondary | ICD-10-CM | POA: Diagnosis not present

## 2021-03-22 DIAGNOSIS — M9905 Segmental and somatic dysfunction of pelvic region: Secondary | ICD-10-CM | POA: Diagnosis not present

## 2021-03-22 DIAGNOSIS — M9903 Segmental and somatic dysfunction of lumbar region: Secondary | ICD-10-CM | POA: Diagnosis not present

## 2021-03-22 DIAGNOSIS — M9904 Segmental and somatic dysfunction of sacral region: Secondary | ICD-10-CM | POA: Diagnosis not present

## 2021-03-23 DIAGNOSIS — M9903 Segmental and somatic dysfunction of lumbar region: Secondary | ICD-10-CM | POA: Diagnosis not present

## 2021-03-23 DIAGNOSIS — M9904 Segmental and somatic dysfunction of sacral region: Secondary | ICD-10-CM | POA: Diagnosis not present

## 2021-03-23 DIAGNOSIS — M9905 Segmental and somatic dysfunction of pelvic region: Secondary | ICD-10-CM | POA: Diagnosis not present

## 2021-03-23 DIAGNOSIS — M9901 Segmental and somatic dysfunction of cervical region: Secondary | ICD-10-CM | POA: Diagnosis not present

## 2021-03-26 DIAGNOSIS — M9905 Segmental and somatic dysfunction of pelvic region: Secondary | ICD-10-CM | POA: Diagnosis not present

## 2021-03-26 DIAGNOSIS — M9903 Segmental and somatic dysfunction of lumbar region: Secondary | ICD-10-CM | POA: Diagnosis not present

## 2021-03-26 DIAGNOSIS — M9904 Segmental and somatic dysfunction of sacral region: Secondary | ICD-10-CM | POA: Diagnosis not present

## 2021-03-26 DIAGNOSIS — M9901 Segmental and somatic dysfunction of cervical region: Secondary | ICD-10-CM | POA: Diagnosis not present

## 2021-03-28 DIAGNOSIS — M9901 Segmental and somatic dysfunction of cervical region: Secondary | ICD-10-CM | POA: Diagnosis not present

## 2021-03-28 DIAGNOSIS — M9904 Segmental and somatic dysfunction of sacral region: Secondary | ICD-10-CM | POA: Diagnosis not present

## 2021-03-28 DIAGNOSIS — M9903 Segmental and somatic dysfunction of lumbar region: Secondary | ICD-10-CM | POA: Diagnosis not present

## 2021-03-28 DIAGNOSIS — M9905 Segmental and somatic dysfunction of pelvic region: Secondary | ICD-10-CM | POA: Diagnosis not present

## 2021-03-30 DIAGNOSIS — M9903 Segmental and somatic dysfunction of lumbar region: Secondary | ICD-10-CM | POA: Diagnosis not present

## 2021-03-30 DIAGNOSIS — M9905 Segmental and somatic dysfunction of pelvic region: Secondary | ICD-10-CM | POA: Diagnosis not present

## 2021-03-30 DIAGNOSIS — M9901 Segmental and somatic dysfunction of cervical region: Secondary | ICD-10-CM | POA: Diagnosis not present

## 2021-03-30 DIAGNOSIS — M9904 Segmental and somatic dysfunction of sacral region: Secondary | ICD-10-CM | POA: Diagnosis not present

## 2021-04-01 NOTE — Patient Instructions (Addendum)
Hi Crystal Levy,  It was great to get to meet you in person! Below is a summary of some of the topics we discussed.   Please reach out to me if you have any questions or need anything before our follow up!  Best, Crystal Levy  Crystal Levy, Crystal Levy, [place] [facility] [place] Visit Information   Goals Addressed             This Visit's Progress    Track and Manage My Blood Pressure-Hypertension       Timeframe:  Short-Term Goal Priority:  Medium Start Date:                             Expected End Date:                       Follow Up Date 06/02/21    - check blood pressure weekly - choose a place to take my blood pressure (home, clinic or office, retail store) - write blood pressure results in a log or diary    Why is this important?   You won't feel high blood pressure, but it can still hurt your blood vessels.  High blood pressure can cause heart or kidney problems. It can also cause a stroke.  Making lifestyle changes like losing a little weight or eating less salt will help.  Checking your blood pressure at home and at different times of the day can help to control blood pressure.  If the doctor prescribes medicine remember to take it the way the doctor ordered.  Call the office if you cannot afford the medicine or if there are questions about it.     Notes:        Patient Care Plan: CCM Pharmacy Care Plan     Problem Identified: Problem: Hypertension, Hyperlipidemia, GERD, Hypothyroidism, Anxiety, Osteoporosis, Osteoarthritis, and Insomnia      Long-Range Goal: Patient-Specific Goal   Start Date: 03/02/2021  Expected End Date: 03/02/2022  This Visit's Progress: On track  Priority: High  Note:   Current Barriers:  Unable to independently monitor therapeutic efficacy Unable to achieve control of cholesterol   Pharmacist Clinical Goal(s):  Patient will achieve adherence to monitoring guidelines and medication adherence  to achieve therapeutic efficacy achieve control of cholesterol as evidenced by next lipid panel  through collaboration with Crystal Levy and provider.   Interventions: 1:1 collaboration with Crystal Levy, Crystal Levy, Crystal Levy regarding development and update of comprehensive plan of care as evidenced by provider attestation and co-signature Inter-disciplinary care team collaboration (see longitudinal plan of care) Comprehensive medication review performed; medication list updated in electronic medical record  Hypertension (BP goal <140/90) -Controlled -Current treatment: Benazepril 10 mg 1 tablet daily -Medications previously tried: none  -Current home readings: no BP machine at home -Current dietary habits: eating leaner meat and fruits and vegetables -Current exercise habits: walking and doing PT exercises -Denies hypotensive/hypertensive symptoms -Educated on BP goals and benefits of medications for prevention of heart attack, stroke and kidney damage; Exercise goal of 150 minutes per week; Importance of home blood pressure monitoring; Proper BP monitoring technique; -Counseled to monitor BP at home weekly, document, and provide log at future appointments -Counseled on diet and exercise extensively Recommended to continue current medication  Hyperlipidemia: (LDL goal < 70) -Uncontrolled -Current treatment: Rosuvastatin 5 mg 1 tablet daily - not taking -Medications previously tried: none  -Current dietary patterns: not eating  fried foods and uses olive oil when cooking; drinks 1 glass of wine 4 nights a week -Current exercise habits: walking and PT exercises -Educated on Cholesterol goals;  Benefits of statin for ASCVD risk reduction; Importance of limiting foods high in cholesterol; Exercise goal of 150 minutes per week; -Counseled on diet and exercise extensively Recommended trial of pravastatin due to lower risk of side effects.  PAD (Goal: prevent heart events and improve  circulation) -Controlled -Current treatment  Aspirin 81 mg 1 tablet daily -Medications previously tried: none  -Recommended to continue current medication Counseled on benefits of taking aspirin given PAD indication to prevent heart events.   Depression/Anxiety/Insomnia (Goal: minimize symptoms and improve sleep) -Not ideally controlled -Current treatment: Citalopram 20 mg 1 tablet daily Lorazepam 1 mg 1 tablet at bedtime as needed -Medications previously tried/failed: none -PHQ9: 8 -GAD7: 7 -Educated on Benefits of medication for symptom control -Counseled on risk of falls with use of lorazepam Recommended discussion with PCP about alternative agents for sleep.   Osteopenia (Goal prevent fractures) -Controlled -Last DEXA Scan: 11/17/20  T-Score femoral neck: -2.1  T-Score total hip: n/a  T-Score lumbar spine: 0.7  T-Score forearm radius: -1.5  10-year probability of major osteoporotic fracture: 16  10-year probability of hip fracture: 4.6% -Patient is not a candidate for pharmacologic treatment -Current treatment  Alendronate 70 mg 1 tablet weekly Sunday morning  Calcium carbonate (unsure of dose) Vitamin D 1000 units 2 capsules daily -Medications previously tried: none  -Recommend [phone] units of vitamin D daily. Recommend 1200 mg of calcium daily from dietary and supplemental sources. Recommend weight-bearing and muscle strengthening exercises for building and maintaining bone density. -Counseled on diet and exercise extensively Recommended to continue current medication Recommended calcium citrate with 600 mg of calcium daily based on intake of calcium from diet.  Hypothyroidism (Goal: TSH 2.5-3.5 based on osteoporosis) -Controlled -Current treatment  Levothyroxine 88 mcg 1 tablet daily before breakfast -Medications previously tried: none  -Recommended to continue current medication  GERD (Goal: minimize symptoms) -Controlled -Current treatment  Pantoprazole 40  mg 1 tablet daily - stopped taking -Medications previously tried: none  -Counseled on duration of therapy with PPIs and long term risks of taking. Patient's heartburn is controlled without it.   Health Maintenance -Vaccine gaps: tetanus -Current therapy:  Coenzyme Q10 50 mg 1 capsule daily Digestive enzymes Magnesium 100 mg 1 capsule daily Multivitamin 1 tablet daily Potassium gluconate 595 mg 1 tablet daily Selenium daily Milk thistle extract 175 mg 1 tablet daily Milk of magnesia as needed Biotin 1000 mcg 1 tablet daily Vitamin B complex daily Alpha lipoic acid 100 mg 1 tablet daily Adrenal metabolism daily Olly skin - hyaluronic acid -Educated on Herbal supplement research is limited and benefits usually cannot be proven Cost vs benefit of each product must be carefully weighed by individual consumer Supplements may interfere with prescription drugs -Patient is satisfied with current therapy and denies issues -Recommended to continue current medication  Patient Goals/Self-Care Activities Patient will:  - take medications as prescribed check blood pressure weekly, document, and provide at future appointments target a minimum of 150 minutes of moderate intensity exercise weekly  Follow Up Plan: The care management team will reach out to the patient again over the next 30 days.       Crystal Levy was given information about Chronic Care Management services today including:  CCM service includes personalized support from designated clinical staff supervised by her physician, including individualized plan of care and  coordination with other care providers 24/7 contact phone numbers for assistance for urgent and routine care needs. Standard insurance, coinsurance, copays and deductibles apply for chronic care management only during months in which we provide at least 20 minutes of these services. Most insurances cover these services at 100%, however patients may be responsible for  any copay, coinsurance and/or deductible if applicable. This service may help you avoid the need for more expensive face-to-face services. Only one practitioner may furnish and bill the service in a calendar month. The patient may stop CCM services at any time (effective at the end of the month) by phone call to the office staff.  Patient agreed to services and verbal consent obtained.   Patient verbalizes understanding of instructions provided today and agrees to view in [place].  The pharmacy team will reach out to the patient again over the next 30 days.   Crystal Levy, [facility]

## 2021-04-02 DIAGNOSIS — M9903 Segmental and somatic dysfunction of lumbar region: Secondary | ICD-10-CM | POA: Diagnosis not present

## 2021-04-02 DIAGNOSIS — M9904 Segmental and somatic dysfunction of sacral region: Secondary | ICD-10-CM | POA: Diagnosis not present

## 2021-04-02 DIAGNOSIS — M9901 Segmental and somatic dysfunction of cervical region: Secondary | ICD-10-CM | POA: Diagnosis not present

## 2021-04-02 DIAGNOSIS — M9905 Segmental and somatic dysfunction of pelvic region: Secondary | ICD-10-CM | POA: Diagnosis not present

## 2021-04-04 DIAGNOSIS — M9903 Segmental and somatic dysfunction of lumbar region: Secondary | ICD-10-CM | POA: Diagnosis not present

## 2021-04-04 DIAGNOSIS — M9904 Segmental and somatic dysfunction of sacral region: Secondary | ICD-10-CM | POA: Diagnosis not present

## 2021-04-04 DIAGNOSIS — M9905 Segmental and somatic dysfunction of pelvic region: Secondary | ICD-10-CM | POA: Diagnosis not present

## 2021-04-04 DIAGNOSIS — M9901 Segmental and somatic dysfunction of cervical region: Secondary | ICD-10-CM | POA: Diagnosis not present

## 2021-04-06 DIAGNOSIS — M9903 Segmental and somatic dysfunction of lumbar region: Secondary | ICD-10-CM | POA: Diagnosis not present

## 2021-04-06 DIAGNOSIS — M9904 Segmental and somatic dysfunction of sacral region: Secondary | ICD-10-CM | POA: Diagnosis not present

## 2021-04-06 DIAGNOSIS — M9901 Segmental and somatic dysfunction of cervical region: Secondary | ICD-10-CM | POA: Diagnosis not present

## 2021-04-06 DIAGNOSIS — M9905 Segmental and somatic dysfunction of pelvic region: Secondary | ICD-10-CM | POA: Diagnosis not present

## 2021-04-09 DIAGNOSIS — M9905 Segmental and somatic dysfunction of pelvic region: Secondary | ICD-10-CM | POA: Diagnosis not present

## 2021-04-09 DIAGNOSIS — M9904 Segmental and somatic dysfunction of sacral region: Secondary | ICD-10-CM | POA: Diagnosis not present

## 2021-04-09 DIAGNOSIS — M9901 Segmental and somatic dysfunction of cervical region: Secondary | ICD-10-CM | POA: Diagnosis not present

## 2021-04-09 DIAGNOSIS — M9903 Segmental and somatic dysfunction of lumbar region: Secondary | ICD-10-CM | POA: Diagnosis not present

## 2021-04-11 DIAGNOSIS — M9903 Segmental and somatic dysfunction of lumbar region: Secondary | ICD-10-CM | POA: Diagnosis not present

## 2021-04-11 DIAGNOSIS — M9901 Segmental and somatic dysfunction of cervical region: Secondary | ICD-10-CM | POA: Diagnosis not present

## 2021-04-11 DIAGNOSIS — M9904 Segmental and somatic dysfunction of sacral region: Secondary | ICD-10-CM | POA: Diagnosis not present

## 2021-04-11 DIAGNOSIS — M9905 Segmental and somatic dysfunction of pelvic region: Secondary | ICD-10-CM | POA: Diagnosis not present

## 2021-04-13 DIAGNOSIS — M9904 Segmental and somatic dysfunction of sacral region: Secondary | ICD-10-CM | POA: Diagnosis not present

## 2021-04-13 DIAGNOSIS — M9905 Segmental and somatic dysfunction of pelvic region: Secondary | ICD-10-CM | POA: Diagnosis not present

## 2021-04-13 DIAGNOSIS — M9901 Segmental and somatic dysfunction of cervical region: Secondary | ICD-10-CM | POA: Diagnosis not present

## 2021-04-13 DIAGNOSIS — M9903 Segmental and somatic dysfunction of lumbar region: Secondary | ICD-10-CM | POA: Diagnosis not present

## 2021-04-16 DIAGNOSIS — M9901 Segmental and somatic dysfunction of cervical region: Secondary | ICD-10-CM | POA: Diagnosis not present

## 2021-04-16 DIAGNOSIS — M9904 Segmental and somatic dysfunction of sacral region: Secondary | ICD-10-CM | POA: Diagnosis not present

## 2021-04-16 DIAGNOSIS — M9905 Segmental and somatic dysfunction of pelvic region: Secondary | ICD-10-CM | POA: Diagnosis not present

## 2021-04-16 DIAGNOSIS — M9903 Segmental and somatic dysfunction of lumbar region: Secondary | ICD-10-CM | POA: Diagnosis not present

## 2021-04-18 DIAGNOSIS — M9903 Segmental and somatic dysfunction of lumbar region: Secondary | ICD-10-CM | POA: Diagnosis not present

## 2021-04-18 DIAGNOSIS — M9905 Segmental and somatic dysfunction of pelvic region: Secondary | ICD-10-CM | POA: Diagnosis not present

## 2021-04-18 DIAGNOSIS — M9901 Segmental and somatic dysfunction of cervical region: Secondary | ICD-10-CM | POA: Diagnosis not present

## 2021-04-18 DIAGNOSIS — M9904 Segmental and somatic dysfunction of sacral region: Secondary | ICD-10-CM | POA: Diagnosis not present

## 2021-04-19 DIAGNOSIS — G8313 Monoplegia of lower limb affecting right nondominant side: Secondary | ICD-10-CM | POA: Diagnosis not present

## 2021-04-19 DIAGNOSIS — M21371 Foot drop, right foot: Secondary | ICD-10-CM | POA: Diagnosis not present

## 2021-04-23 DIAGNOSIS — M9903 Segmental and somatic dysfunction of lumbar region: Secondary | ICD-10-CM | POA: Diagnosis not present

## 2021-04-23 DIAGNOSIS — M9904 Segmental and somatic dysfunction of sacral region: Secondary | ICD-10-CM | POA: Diagnosis not present

## 2021-04-23 DIAGNOSIS — M9901 Segmental and somatic dysfunction of cervical region: Secondary | ICD-10-CM | POA: Diagnosis not present

## 2021-04-23 DIAGNOSIS — M9905 Segmental and somatic dysfunction of pelvic region: Secondary | ICD-10-CM | POA: Diagnosis not present

## 2021-04-25 DIAGNOSIS — M9904 Segmental and somatic dysfunction of sacral region: Secondary | ICD-10-CM | POA: Diagnosis not present

## 2021-04-25 DIAGNOSIS — M9903 Segmental and somatic dysfunction of lumbar region: Secondary | ICD-10-CM | POA: Diagnosis not present

## 2021-04-25 DIAGNOSIS — M9901 Segmental and somatic dysfunction of cervical region: Secondary | ICD-10-CM | POA: Diagnosis not present

## 2021-04-25 DIAGNOSIS — M9905 Segmental and somatic dysfunction of pelvic region: Secondary | ICD-10-CM | POA: Diagnosis not present

## 2021-05-02 DIAGNOSIS — M9905 Segmental and somatic dysfunction of pelvic region: Secondary | ICD-10-CM | POA: Diagnosis not present

## 2021-05-02 DIAGNOSIS — M9904 Segmental and somatic dysfunction of sacral region: Secondary | ICD-10-CM | POA: Diagnosis not present

## 2021-05-02 DIAGNOSIS — M9901 Segmental and somatic dysfunction of cervical region: Secondary | ICD-10-CM | POA: Diagnosis not present

## 2021-05-02 DIAGNOSIS — M9903 Segmental and somatic dysfunction of lumbar region: Secondary | ICD-10-CM | POA: Diagnosis not present

## 2021-05-04 DIAGNOSIS — M9903 Segmental and somatic dysfunction of lumbar region: Secondary | ICD-10-CM | POA: Diagnosis not present

## 2021-05-04 DIAGNOSIS — M9905 Segmental and somatic dysfunction of pelvic region: Secondary | ICD-10-CM | POA: Diagnosis not present

## 2021-05-04 DIAGNOSIS — M9901 Segmental and somatic dysfunction of cervical region: Secondary | ICD-10-CM | POA: Diagnosis not present

## 2021-05-04 DIAGNOSIS — M9904 Segmental and somatic dysfunction of sacral region: Secondary | ICD-10-CM | POA: Diagnosis not present

## 2021-05-07 DIAGNOSIS — M9901 Segmental and somatic dysfunction of cervical region: Secondary | ICD-10-CM | POA: Diagnosis not present

## 2021-05-07 DIAGNOSIS — M9903 Segmental and somatic dysfunction of lumbar region: Secondary | ICD-10-CM | POA: Diagnosis not present

## 2021-05-07 DIAGNOSIS — M9905 Segmental and somatic dysfunction of pelvic region: Secondary | ICD-10-CM | POA: Diagnosis not present

## 2021-05-07 DIAGNOSIS — M9904 Segmental and somatic dysfunction of sacral region: Secondary | ICD-10-CM | POA: Diagnosis not present

## 2021-05-09 DIAGNOSIS — M9905 Segmental and somatic dysfunction of pelvic region: Secondary | ICD-10-CM | POA: Diagnosis not present

## 2021-05-09 DIAGNOSIS — M9901 Segmental and somatic dysfunction of cervical region: Secondary | ICD-10-CM | POA: Diagnosis not present

## 2021-05-09 DIAGNOSIS — M9903 Segmental and somatic dysfunction of lumbar region: Secondary | ICD-10-CM | POA: Diagnosis not present

## 2021-05-09 DIAGNOSIS — M9904 Segmental and somatic dysfunction of sacral region: Secondary | ICD-10-CM | POA: Diagnosis not present

## 2021-05-15 ENCOUNTER — Telehealth: Payer: Self-pay | Admitting: Pharmacist

## 2021-05-15 NOTE — Chronic Care Management (AMB) (Signed)
Chronic Care Management Pharmacy Assistant   Name: Crystal Levy  MRN: [id number] DOB: 16-Feb-1941   Reason for Encounter: General Assessment Call   Recent office visits:  None  Recent consult visits:  None  Hospital visits:  None in previous 6 months  Medications: Outpatient Encounter Medications as of 05/15/2021  Medication Sig   alendronate (FOSAMAX) 70 MG tablet Take 1 tablet (70 mg total) by mouth every 7 (seven) days. Take with a full glass of water on an empty stomach.   Alpha-Lipoic Acid 100 MG TABS Take by mouth.   b complex vitamins tablet Take 1 tablet by mouth daily.   benazepril (LOTENSIN) 10 MG tablet TAKE 1 TABLET BY MOUTH  DAILY   calcium carbonate (OSCAL) 1500 (600 Ca) MG TABS tablet Take by mouth. (Patient not taking: Reported on 03/02/2021)   Cholecalciferol (VITAMIN D-3) 1000 UNITS CAPS Take 2 capsules by mouth daily.   citalopram (CELEXA) 20 MG tablet TAKE 1 TABLET BY MOUTH  DAILY   co-enzyme Q-10 50 MG capsule Take 50 mg by mouth daily.   DIGESTIVE ENZYMES PO Take by mouth.   levothyroxine (SYNTHROID) 88 MCG tablet TAKE 1 TABLET BY MOUTH  DAILY BEFORE BREAKFAST   LORazepam (ATIVAN) 1 MG tablet TAKE 1 TABLET BY MOUTH AT BEDTIME AS NEEDED FOR ANXIETY   Magnesium 100 MG CAPS Take by mouth.   Milk Thistle Extract 175 MG TABS Take by mouth.   NON FORMULARY Place under the tongue. [place] Flower Essence   Polyethylene Glycol 3350 (MIRALAX PO) Take by mouth 2 (two) times a week.   potassium gluconate 595 MG TABS tablet Take 595 mg by mouth daily.   rosuvastatin (CRESTOR) 5 MG tablet TAKE 1 TABLET BY MOUTH  DAILY   Selenium (SELENIMIN PO) Take by mouth daily.   No facility-administered encounter medications on file as of 05/15/2021.  Notes: Call to patient for General Assessment, she reports she is doing well at this time. She reports she has stopped taking her Baby Asprin as she had been noticing some bruising. She reports when she was starting the Crestor  she had for those initial 2-3 months been experiencing a dull headache . She reports it resolved and she has had no issues since then. She does note that she has always when she got a headache felt that it was a sharp headache on her temples, and she notes that she thinks they have gotten sharper over the years, but not related to her medications. She reports she has been eating well and trying to make healthier choices and stay away from sweets and loose a few pounds lately. She reports she has been exercising 30- 40 min a day. She reports she has been sleeping well, she notes she had tried to cut back and taper down on her Ativan, she tried taking half a tab at night and reports she was waking up in the middle of the night and had trouble getting back to sleep so has gone back to the full pill. Offered her a follow up appointment with the Clinical Pharmacist and she declined at this time. She noted she will call when she is ready.   Care Gaps: Flu Vaccine - Overdue COVID Booster #5 [employer]) - Overdue  Star Rating Drugs: Benazepril ([place]) 10 mg - Last filled 11-10-2020 90 DS at [facility] Rosuvastatin (Crestor) 5 mg - Last filled 12-01-2020 90 DS at [facility]  Call to [facility] spoke to [place] who verified the fill dates  as follows Benazepril 10 mg Last filled  04-09-2021 90 DS  Rosuvastatin 5 mg Last filled  02-24-2021 90 DS  [place] Clinical Pharmacist Assistant [phone]

## 2021-05-16 DIAGNOSIS — M9903 Segmental and somatic dysfunction of lumbar region: Secondary | ICD-10-CM | POA: Diagnosis not present

## 2021-05-16 DIAGNOSIS — M9904 Segmental and somatic dysfunction of sacral region: Secondary | ICD-10-CM | POA: Diagnosis not present

## 2021-05-16 DIAGNOSIS — M9905 Segmental and somatic dysfunction of pelvic region: Secondary | ICD-10-CM | POA: Diagnosis not present

## 2021-05-16 DIAGNOSIS — M9901 Segmental and somatic dysfunction of cervical region: Secondary | ICD-10-CM | POA: Diagnosis not present

## 2021-05-18 DIAGNOSIS — M9904 Segmental and somatic dysfunction of sacral region: Secondary | ICD-10-CM | POA: Diagnosis not present

## 2021-05-18 DIAGNOSIS — M9903 Segmental and somatic dysfunction of lumbar region: Secondary | ICD-10-CM | POA: Diagnosis not present

## 2021-05-18 DIAGNOSIS — M9905 Segmental and somatic dysfunction of pelvic region: Secondary | ICD-10-CM | POA: Diagnosis not present

## 2021-05-18 DIAGNOSIS — M9901 Segmental and somatic dysfunction of cervical region: Secondary | ICD-10-CM | POA: Diagnosis not present

## 2021-05-21 DIAGNOSIS — M9903 Segmental and somatic dysfunction of lumbar region: Secondary | ICD-10-CM | POA: Diagnosis not present

## 2021-05-21 DIAGNOSIS — M9901 Segmental and somatic dysfunction of cervical region: Secondary | ICD-10-CM | POA: Diagnosis not present

## 2021-05-21 DIAGNOSIS — M9905 Segmental and somatic dysfunction of pelvic region: Secondary | ICD-10-CM | POA: Diagnosis not present

## 2021-05-21 DIAGNOSIS — M9904 Segmental and somatic dysfunction of sacral region: Secondary | ICD-10-CM | POA: Diagnosis not present

## 2021-05-23 DIAGNOSIS — M9905 Segmental and somatic dysfunction of pelvic region: Secondary | ICD-10-CM | POA: Diagnosis not present

## 2021-05-23 DIAGNOSIS — M9903 Segmental and somatic dysfunction of lumbar region: Secondary | ICD-10-CM | POA: Diagnosis not present

## 2021-05-23 DIAGNOSIS — M9901 Segmental and somatic dysfunction of cervical region: Secondary | ICD-10-CM | POA: Diagnosis not present

## 2021-05-23 DIAGNOSIS — M9904 Segmental and somatic dysfunction of sacral region: Secondary | ICD-10-CM | POA: Diagnosis not present

## 2021-05-28 ENCOUNTER — Encounter: Payer: Self-pay | Admitting: Family Medicine

## 2021-05-28 ENCOUNTER — Ambulatory Visit (INDEPENDENT_AMBULATORY_CARE_PROVIDER_SITE_OTHER): Payer: Medicare Other | Admitting: Family Medicine

## 2021-05-28 ENCOUNTER — Other Ambulatory Visit: Payer: Self-pay

## 2021-05-28 VITALS — BP 132/70 | HR 75 | Resp 16 | Ht 65.0 in | Wt 143.4 lb

## 2021-05-28 DIAGNOSIS — E785 Hyperlipidemia, unspecified: Secondary | ICD-10-CM | POA: Diagnosis not present

## 2021-05-28 DIAGNOSIS — I739 Peripheral vascular disease, unspecified: Secondary | ICD-10-CM | POA: Diagnosis not present

## 2021-05-28 DIAGNOSIS — M9904 Segmental and somatic dysfunction of sacral region: Secondary | ICD-10-CM | POA: Diagnosis not present

## 2021-05-28 DIAGNOSIS — M9905 Segmental and somatic dysfunction of pelvic region: Secondary | ICD-10-CM | POA: Diagnosis not present

## 2021-05-28 DIAGNOSIS — Z23 Encounter for immunization: Secondary | ICD-10-CM

## 2021-05-28 DIAGNOSIS — I10 Essential (primary) hypertension: Secondary | ICD-10-CM

## 2021-05-28 DIAGNOSIS — G4700 Insomnia, unspecified: Secondary | ICD-10-CM | POA: Diagnosis not present

## 2021-05-28 DIAGNOSIS — R296 Repeated falls: Secondary | ICD-10-CM

## 2021-05-28 DIAGNOSIS — M9903 Segmental and somatic dysfunction of lumbar region: Secondary | ICD-10-CM | POA: Diagnosis not present

## 2021-05-28 DIAGNOSIS — M9901 Segmental and somatic dysfunction of cervical region: Secondary | ICD-10-CM | POA: Diagnosis not present

## 2021-05-28 DIAGNOSIS — F419 Anxiety disorder, unspecified: Secondary | ICD-10-CM

## 2021-05-28 LAB — BASIC METABOLIC PANEL
BUN: 7 mg/dL (ref 6–23)
CO2: 31 mEq/L (ref 19–32)
Calcium: 9 mg/dL (ref 8.4–10.5)
Chloride: 103 mEq/L (ref 96–112)
Creatinine, Ser: 0.59 mg/dL (ref 0.40–1.20)
GFR: 85.1 mL/min (ref >60.00)
Glucose, Bld: 97 mg/dL (ref 70–99)
Potassium: 4.2 mEq/L (ref 3.5–5.1)
Sodium: 141 mEq/L (ref 135–145)

## 2021-05-28 LAB — LIPID PANEL
Cholesterol: 151 mg/dL (ref 0–200)
HDL: 45.6 mg/dL (ref >39.00)
LDL Cholesterol: 78 mg/dL (ref 0–99)
NonHDL: 105.28
Total CHOL/HDL Ratio: 3
Triglycerides: 135 mg/dL (ref 0.0–149.0)
VLDL: 27 mg/dL (ref 0.0–40.0)

## 2021-05-28 NOTE — Assessment & Plan Note (Signed)
Continue Crestor 5 mg daily. I would like LC+DL at least < 100. Further recommendations according to FLP results.

## 2021-05-28 NOTE — Assessment & Plan Note (Signed)
Problem is stable. Continue 20 mg daily and lorazepam 1 mg at bedtime as needed.

## 2021-05-28 NOTE — Patient Instructions (Signed)
A few things to remember from today's visit:   Hyperlipidemia, unspecified hyperlipidemia type - Plan: Lipid panel  PAD (peripheral artery disease) ([facility]), Chronic  Essential hypertension - Plan: Basic metabolic panel  If you need refills please call your pharmacy. Do not use My Chart to request refills or for acute issues that need immediate attention.   No changes today. If bad cholesterol is not at goal, we will need to increase dose of Crestor.  Please be sure medication list is accurate. If a new problem present, please set up appointment sooner than planned today.

## 2021-05-28 NOTE — Assessment & Plan Note (Signed)
We discussed benefits and side effects of Aspirin, she d/c because bruising. Continue Crestor 5 mg daily.

## 2021-05-28 NOTE — Progress Notes (Signed)
HPI: [name] is a 80 y.o. female, who is here today for 5 months follow up.   She was last seen on 12/25/20. Since her last visit she has seen ortho for left knee pain and right shoulder. She started PT for fall prevention, she did not feel like it helped. 3 falls since 01/2021. Last fall 02/10/21. Still has some discomfort on area of trauma, left frontal and cheek. She did not seek medical attention at the falls.  Hips and lower back, she has been seen chiropractor since 03/2021. She feels like she is doing better, she can walk longer distances before she starts having pain. Following with chiropractor 2 times per week.  RLE new brace has also help with gait stability. Using her cane less frequent.  Hypertension:  Medications: Benazepril 10 mg daily. Side effects: None. Negative for unusual or severe headache, visual changes, exertional chest pain, dyspnea,  focal weakness, or edema.  Lab Results  Component Value Date   CREATININE 0.59 12/26/2020   BUN 13 12/26/2020   NA 139 12/26/2020   K 4.2 12/26/2020   CL 101 12/26/2020   CO2 32 12/26/2020   PAD: Stopped Aspirin because it was causing more bruising. ABI 7/22/212: Right: Resting right ankle-brachial index indicates moderate right lower extremity arterial disease. The right toe-brachial index is abnormal. RT great toe pressure = 94 mmHg.  Left: Resting left ankle-brachial index is within normal range. The left toe-brachial index is abnormal. LT Great toe pressure = 85 mmHg.   Anxiety and insomnia: Currently she is on Celexa 20 mg daily and alprazolam 1 mg at bedtime prn. She tried to decrease her alprazolam dose to 1/2 tab but she was waking up earlier, so she is back to 1 mg at bedtime. Denies side effect from medications.  Hyperlipidemia: Currently on Crestor 5 mg daily. Following a low fat diet: Yes. She also increased physical activity. Side effects from medication: None.  Lab Results  Component  Value Date   CHOL 173 12/26/2020   HDL 50.80 12/26/2020   LDLDIRECT 111.0 12/26/2020   TRIG 201.0 (H) 12/26/2020   CHOLHDL 3 12/26/2020   She wants to get Polio vaccine.  According the patient, she will call to [place] and she was told she needed a letter from her PCP in order to get polio vaccine. 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  Review of Systems  Constitutional:  Negative for activity change, appetite change, fatigue and fever.  HENT:  Negative for mouth sores, nosebleeds and sore throat.   Respiratory:  Negative for cough and wheezing.   Gastrointestinal:  Negative for abdominal pain, nausea and vomiting.       Negative for changes in bowel habits.  Genitourinary:  Negative for decreased urine volume and hematuria.  Musculoskeletal:  Positive for arthralgias and gait problem.  Skin:  Negative for pallor and rash.  Neurological:  Negative for syncope, facial asymmetry and weakness.  Psychiatric/Behavioral:  Positive for sleep disturbance. Negative for confusion. The patient is nervous/anxious.   Rest of ROS, see pertinent positives sand negatives in HPI  Current Outpatient Medications on File Prior to Visit  Medication Sig Dispense Refill   alendronate (FOSAMAX) 70 MG tablet Take 1 tablet (70 mg total) by mouth every 7 (seven) days. Take with a full glass of water on an empty stomach. 4 tablet 11   Alpha-Lipoic Acid 100 MG TABS Take by mouth.  b complex vitamins tablet Take 1 tablet by mouth daily.     benazepril (LOTENSIN) 10 MG tablet TAKE 1 TABLET BY MOUTH  DAILY 90 tablet 2   calcium carbonate (OSCAL) 1500 (600 Ca) MG TABS tablet Take by mouth.     Cholecalciferol (VITAMIN D-3) 1000 UNITS CAPS Take 2 capsules by mouth daily.     citalopram (CELEXA) 20 MG tablet TAKE 1 TABLET BY MOUTH  DAILY 90 tablet 3   co-enzyme Q-10 50 MG capsule Take 50 mg by mouth daily.      DIGESTIVE ENZYMES PO Take by mouth.     levothyroxine (SYNTHROID) 88 MCG tablet TAKE 1 TABLET BY MOUTH  DAILY BEFORE BREAKFAST 90 tablet 2   LORazepam (ATIVAN) 1 MG tablet TAKE 1 TABLET BY MOUTH AT BEDTIME AS NEEDED FOR ANXIETY 30 tablet 3   Magnesium 100 MG CAPS Take by mouth.     Milk Thistle Extract 175 MG TABS Take by mouth.     NON FORMULARY Place under the tongue. [place] Flower Essence     Polyethylene Glycol 3350 (MIRALAX PO) Take by mouth 2 (two) times a week.     potassium gluconate 595 MG TABS tablet Take 595 mg by mouth daily.     rosuvastatin (CRESTOR) 5 MG tablet TAKE 1 TABLET BY MOUTH  DAILY 90 tablet 3   Selenium (SELENIMIN PO) Take by mouth daily.     No current facility-administered medications on file prior to visit.   Past Medical History:  Diagnosis Date   Anxiety    Arthritis    Bicuspid aortic valve    Depression    Hypertension    Hypothyroidism    Polio    Thyroid disease    Allergies  Allergen Reactions   Clindamycin/Lincomycin Diarrhea and Other (See Comments)    C-Diff   Fluticasone     Headache and nosebleed    Azithromycin    Eggs Or Egg-Derived Products    Milk-Related Compounds     Unknown reaction   Penicillins Rash   Sulfa Antibiotics Diarrhea   Sulfamethoxazole-Trimethoprim    Fenofibrate Rash    Social History   Socioeconomic History   Marital status: Divorced    Spouse name: Not on file   Number of children: 2   Years of education: Not on file   Highest education level: Not on file  Occupational History    Employer: [employer]  Tobacco Use   Smoking status: Never   Smokeless tobacco: Never   Tobacco comments:    Rare smoker in the distant past.   Substance and Sexual Activity   Alcohol use: Yes    Alcohol/week: 0.0 standard drinks    Comment: wine   Drug use: No   Sexual activity: Not on file  Other Topics Concern   Not on file  Social History Narrative   Lives alone. Retired psychiatric Research officer, political party.    Social Determinants of Health   Financial Resource Strain: Low Risk    Difficulty of Paying Living Expenses: Not hard at all  Food Insecurity: No Food Insecurity   Worried About Charity fundraiser in the Last Year: Never true   [place] in the Last Year: Never true  Transportation Needs: No Transportation Needs   Lack of Transportation (Medical): No   Lack of Transportation (Non-Medical): No  Physical Activity: Inactive   Days of Exercise per Week: 0 days   Minutes of Exercise per Session: 0 min  Stress: No Stress Concern Present   Feeling of Stress : Not at all  Social Connections: Socially Isolated   Frequency of Communication with Friends and Family: Twice a week   Frequency of Social Gatherings with Friends and Family: Twice a week   Attends Religious Services: Never   Marine scientist or Organizations: No   Attends Archivist Meetings: Never   Marital Status: Divorced   Vitals:   05/28/21 0855  BP: 132/70  Pulse: 75  Resp: 16  SpO2: 98%   Wt Readings from Last 3 Encounters:  05/28/21 143 lb 6 oz (65 kg)  02/14/21 150 lb (68 kg)  10/27/20 147 lb (66.7 kg)   Body mass index is 23.86 kg/m.  Physical Exam Vitals and nursing note reviewed.  Constitutional:      General: She is not in acute distress.    Appearance: She is well-developed.  HENT:     Head: Normocephalic and atraumatic.     Mouth/Throat:     Mouth: Mucous membranes are moist.     Pharynx: Oropharynx is clear.  Eyes:     Conjunctiva/sclera: Conjunctivae normal.  Cardiovascular:     Rate and Rhythm: Normal rate and regular rhythm.     Heart sounds: No murmur heard.    Comments: PT pulses present. Pulmonary:     Effort: Pulmonary effort is normal. No respiratory distress.     Breath sounds: Normal breath sounds.  Abdominal:     Palpations: Abdomen is soft. There is no hepatomegaly or mass.     Tenderness: There is no abdominal tenderness.  Musculoskeletal:      Right lower leg: No edema.     Left lower leg: No edema.  Lymphadenopathy:     Cervical: No cervical adenopathy.  Skin:    General: Skin is warm.     Findings: No erythema or rash.  Neurological:     Mental Status: She is alert and oriented to person, place, and time.     Comments: Mildly unstable gait, not assisted today. Muscle atrophy RLE and brace in place.  Psychiatric:     Comments: Well groomed, good eye contact.   ASSESSMENT AND PLAN:  Crystal Levy was seen today for 5 months follow-up.  Diagnoses and all orders for this visit: Orders Placed This Encounter  Procedures   Flu Vaccine QUAD High Dose(Fluad)   Basic metabolic panel   Lipid panel    Lab Results  Component Value Date   CHOL 151 05/28/2021   HDL 45.60 05/28/2021   LDLCALC 78 05/28/2021   LDLDIRECT 111.0 12/26/2020   TRIG 135.0 05/28/2021   CHOLHDL 3 05/28/2021   Lab Results  Component Value Date   CREATININE 0.59 05/28/2021   BUN 7 05/28/2021   NA 141 05/28/2021   K 4.2 05/28/2021   CL 103 05/28/2021   CO2 31 05/28/2021   PAD (peripheral artery disease) ([facility]) We discussed benefits and side effects of Aspirin, she d/c because bruising. Continue Crestor 5 mg daily.  Hyperlipidemia Continue Crestor 5 mg daily. I would like LC+DL at least < 100. Further recommendations according to FLP results.  HTN (hypertension) BP adequately controlled. Continue current management: Benazepril 10 mg daily. DASH/low salt diet to continue. Monitor BP at home. Eye exam is current.  Insomnia Lorazepam 1 mg has helped with this problem. She has tolerated medication well for years. Good sleep hygiene. No changes in lorazepam dose.  Anxiety disorder, unspecified Problem is stable. Continue  20 mg daily and lorazepam 1 mg at bedtime as needed.  Frequent falls Last one in 02/2021.  I do not think imaging needed at this time, hx suggest soft tissue trauma. She has a few risk factors, including  generalized OA, some of her medications, and right-sided muscle weakness. Fall precautions discussed. Treatments with chiropractor seems to be helping with balance.  Need for influenza vaccination -     Flu Vaccine QUAD High Dose(Fluad)  I spent a total of 40 minutes in both face to face and non face to face activities for this visit on the date of this encounter. During this time history was obtained and documented, examination was performed, prior labs/imaging reviewed, and assessment/plan discussed.  Return in about 6 months (around 11/25/2021) for cpe.   Nahiem Dredge G. [place], MD  [facility]. [place] office.

## 2021-05-28 NOTE — Assessment & Plan Note (Signed)
BP adequately controlled. Continue current management: Benazepril 10 mg daily. DASH/low salt diet to continue. Monitor BP at home. Eye exam is current.

## 2021-05-28 NOTE — Assessment & Plan Note (Signed)
Lorazepam 1 mg has helped with this problem. She has tolerated medication well for years. Good sleep hygiene. No changes in lorazepam dose.

## 2021-05-30 DIAGNOSIS — M9904 Segmental and somatic dysfunction of sacral region: Secondary | ICD-10-CM | POA: Diagnosis not present

## 2021-05-30 DIAGNOSIS — M9901 Segmental and somatic dysfunction of cervical region: Secondary | ICD-10-CM | POA: Diagnosis not present

## 2021-05-30 DIAGNOSIS — M9905 Segmental and somatic dysfunction of pelvic region: Secondary | ICD-10-CM | POA: Diagnosis not present

## 2021-05-30 DIAGNOSIS — M9903 Segmental and somatic dysfunction of lumbar region: Secondary | ICD-10-CM | POA: Diagnosis not present

## 2021-06-04 DIAGNOSIS — M9903 Segmental and somatic dysfunction of lumbar region: Secondary | ICD-10-CM | POA: Diagnosis not present

## 2021-06-04 DIAGNOSIS — D225 Melanocytic nevi of trunk: Secondary | ICD-10-CM | POA: Diagnosis not present

## 2021-06-04 DIAGNOSIS — M9901 Segmental and somatic dysfunction of cervical region: Secondary | ICD-10-CM | POA: Diagnosis not present

## 2021-06-04 DIAGNOSIS — M9904 Segmental and somatic dysfunction of sacral region: Secondary | ICD-10-CM | POA: Diagnosis not present

## 2021-06-04 DIAGNOSIS — L821 Other seborrheic keratosis: Secondary | ICD-10-CM | POA: Diagnosis not present

## 2021-06-04 DIAGNOSIS — M9905 Segmental and somatic dysfunction of pelvic region: Secondary | ICD-10-CM | POA: Diagnosis not present

## 2021-06-04 DIAGNOSIS — L814 Other melanin hyperpigmentation: Secondary | ICD-10-CM | POA: Diagnosis not present

## 2021-06-04 DIAGNOSIS — D485 Neoplasm of uncertain behavior of skin: Secondary | ICD-10-CM | POA: Diagnosis not present

## 2021-06-11 DIAGNOSIS — M9904 Segmental and somatic dysfunction of sacral region: Secondary | ICD-10-CM | POA: Diagnosis not present

## 2021-06-11 DIAGNOSIS — M9903 Segmental and somatic dysfunction of lumbar region: Secondary | ICD-10-CM | POA: Diagnosis not present

## 2021-06-11 DIAGNOSIS — M9905 Segmental and somatic dysfunction of pelvic region: Secondary | ICD-10-CM | POA: Diagnosis not present

## 2021-06-11 DIAGNOSIS — M9901 Segmental and somatic dysfunction of cervical region: Secondary | ICD-10-CM | POA: Diagnosis not present

## 2021-06-13 DIAGNOSIS — M9901 Segmental and somatic dysfunction of cervical region: Secondary | ICD-10-CM | POA: Diagnosis not present

## 2021-06-13 DIAGNOSIS — M9903 Segmental and somatic dysfunction of lumbar region: Secondary | ICD-10-CM | POA: Diagnosis not present

## 2021-06-13 DIAGNOSIS — M9905 Segmental and somatic dysfunction of pelvic region: Secondary | ICD-10-CM | POA: Diagnosis not present

## 2021-06-13 DIAGNOSIS — M9904 Segmental and somatic dysfunction of sacral region: Secondary | ICD-10-CM | POA: Diagnosis not present

## 2021-06-14 NOTE — Progress Notes (Signed)
Chief Complaint  Patient presents with   Hypertension    Having high BP readings in the evening times. Having dizziness, feeling lightheaded, and headaches.     Crystal Levy is a 80 y.o.female with hx of HTN,anxiety,PAD,OA, and right-sided weakness s/p polio during childhood here today concerned about elevated BP's.  Last follow up visit: 05/28/21.  Hypertension:  Medications:Benazepril 10 mg daily. Side effects:None.  Negative for unusual or severe headache, visual changes, exertional chest pain,palpitations, dyspnea, new focal weakness, or edema.  Lab Results  Component Value Date   CREATININE 0.59 05/28/2021   BUN 7 05/28/2021   NA 141 05/28/2021   K 4.2 05/28/2021   CL 103 05/28/2021   CO2 31 05/28/2021   After starting Crestor she had vague headache but resolved and started back right temporal sharp a month ago and left parietal pressure headache more recent. She has had right temporal headaches before but seems to be more intense. Pain is 5-6/10,intermittent. Left parietal headache and lightheadedness are new problems.  Symptoms are usually in the evening. She has had a few falls with head trauma, last one 02/07/2021. She did not seek medical attention.  Tylenol helps. Headache is not always associated with lightheadedness. It is not like spinning but rather feeling of instability. Most of the time it happens with prolonged standing, briefly. She uses a cane for long walks.  She is concerned about elevated BP causing symptoms. BP's 130's-160's/70's-80's. No new stressors or changes in diet.  Sometimes left earache but no changes I hearing, N/V,or MS changes.  Review of Systems  Constitutional:  Negative for chills and fever.  HENT:  Negative for nosebleeds and sore throat.   Respiratory:  Negative for cough and wheezing.   Gastrointestinal:  Negative for abdominal pain.       No changes in bowel habits.  Endocrine: Negative for cold intolerance and  heat intolerance.  Genitourinary:  Negative for decreased urine volume and hematuria.  Allergic/Immunologic: Positive for environmental allergies.  Neurological:  Negative for syncope and facial asymmetry.  Rest see pertinent positives and negatives per HPI.  Current Outpatient Medications on File Prior to Visit  Medication Sig Dispense Refill   alendronate (FOSAMAX) 70 MG tablet Take 1 tablet (70 mg total) by mouth every 7 (seven) days. Take with a full glass of water on an empty stomach. 4 tablet 11   Alpha-Lipoic Acid 100 MG TABS Take by mouth.     b complex vitamins tablet Take 1 tablet by mouth daily.     benazepril (LOTENSIN) 10 MG tablet TAKE 1 TABLET BY MOUTH  DAILY 90 tablet 2   calcium carbonate (OSCAL) 1500 (600 Ca) MG TABS tablet Take by mouth.     Cholecalciferol (VITAMIN D-3) 1000 UNITS CAPS Take 2 capsules by mouth daily.     citalopram (CELEXA) 20 MG tablet TAKE 1 TABLET BY MOUTH  DAILY 90 tablet 3   co-enzyme Q-10 50 MG capsule Take 50 mg by mouth daily.     DIGESTIVE ENZYMES PO Take by mouth.     levothyroxine (SYNTHROID) 88 MCG tablet TAKE 1 TABLET BY MOUTH  DAILY BEFORE BREAKFAST 90 tablet 2   LORazepam (ATIVAN) 1 MG tablet TAKE 1 TABLET BY MOUTH AT BEDTIME AS NEEDED FOR ANXIETY 30 tablet 3   Magnesium 100 MG CAPS Take by mouth.     NON FORMULARY Place under the tongue. [place]relandra Flower Essence     potassium gluconate 595 MG TABS tablet Take 595  mg by mouth daily.     rosuvastatin (CRESTOR) 5 MG tablet TAKE 1 TABLET BY MOUTH  DAILY 90 tablet 3   Selenium (SELENIMIN PO) Take by mouth daily.     UNABLE TO FIND Olly Skin     UNABLE TO FIND Adrenal Metabolism Support     No current facility-administered medications on file prior to visit.   Past Medical History:  Diagnosis Date   Anxiety    Arthritis    Bicuspid aortic valve    Depression    Hypertension    Hypothyroidism    Polio    Thyroid disease     Allergies  Allergen Reactions   Clindamycin/Lincomycin  Diarrhea and Other (See Comments)    C-Diff   Fluticasone     Headache and nosebleed    Azithromycin    Eggs Or Egg-Derived Products    Milk-Related Compounds     Unknown reaction   Penicillins Rash   Sulfa Antibiotics Diarrhea   Sulfamethoxazole-Trimethoprim    Fenofibrate Rash   Social History   Socioeconomic History   Marital status: Divorced    Spouse name: Not on file   Number of children: 2   Years of education: Not on file   Highest education level: Not on file  Occupational History    Employer: [employer]  Tobacco Use   Smoking status: Never   Smokeless tobacco: Never   Tobacco comments:    Rare smoker in the distant past.   Substance and Sexual Activity   Alcohol use: Yes    Alcohol/week: 0.0 standard drinks    Comment: wine   Drug use: No   Sexual activity: Not on file  Other Topics Concern   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    Social Determinants of Health   Financial Resource Strain: Low Risk    Difficulty of Paying Living Expenses: Not hard at all  Food Insecurity: No Food Insecurity   Worried About Charity fundraiser in the Last Year: Never true   [place] in the Last Year: Never true  Transportation Needs: No Transportation Needs   Lack of Transportation (Medical): No   Lack of Transportation (Non-Medical): No  Physical Activity: Inactive   Days of Exercise per Week: 0 days   Minutes of Exercise per Session: 0 min  Stress: No Stress Concern Present   Feeling of Stress : Not at all  Social Connections: Socially Isolated   Frequency of Communication with Friends and Family: Twice a week   Frequency of Social Gatherings with Friends and Family: Twice a week   Attends Religious Services: Never   Marine scientist or Organizations: No   Attends Archivist Meetings: Never   Marital Status: Divorced   Vitals:   06/15/21 0721  BP: 120/76  Pulse: 80  Resp:  16  SpO2: 98%   Body mass index is 24.03 kg/m.  Physical Exam Vitals and nursing note reviewed.  Constitutional:      General: She is not in acute distress.    Appearance: She is well-developed.  HENT:     Head: Normocephalic and atraumatic.     Comments: No tenderness upon palpation of scalp.    Mouth/Throat:     Mouth: Mucous membranes are moist.     Pharynx: Oropharynx is clear.  Eyes:     Conjunctiva/sclera: Conjunctivae normal.     Funduscopic exam:  Right eye: No hemorrhage or exudate.        Left eye: No hemorrhage or exudate.  Cardiovascular:     Rate and Rhythm: Normal rate and regular rhythm.     Heart sounds: Murmur (Soft SEM RUSB) heard.  Pulmonary:     Effort: Pulmonary effort is normal. No respiratory distress.     Breath sounds: Normal breath sounds.  Abdominal:     Palpations: Abdomen is soft. There is no hepatomegaly or mass.     Tenderness: There is no abdominal tenderness.  Musculoskeletal:     Cervical back: Tenderness present. No bony tenderness. Decreased range of motion.       Back:     Comments: RLE brace in place.  Lymphadenopathy:     Cervical: No cervical adenopathy.  Skin:    General: Skin is warm.     Findings: No erythema or rash.  Neurological:     General: No focal deficit present.     Mental Status: She is alert and oriented to person, place, and time.     Deep Tendon Reflexes:     Reflex Scores:      Bicep reflexes are 2+ on the right side and 2+ on the left side.      Patellar reflexes are 1+ on the right side and 2+ on the left side.    Comments: Mildly unstable gait, not assisted today.  Psychiatric:     Comments: Well groomed, good eye contact.   ASSESSMENT AND PLAN:   Crystal Levy was seen today for hypertension.  Diagnoses and all orders for this visit: Orders Placed This Encounter  Procedures   CT HEAD WO CONTRAST (5MM)   Post-traumatic headache, not intractable, unspecified chronicity pattern Right temporal  headache getting worse and new left parietal + hx of head trauma, so I think it is appropriate to have a head CT. ? Tension headache. Clearly instructed about warning signs. Continue Tylenol 500 mg 3-4 times per day as needed.  Primary hypertension Re-checked 130/65. We discussed current recommendations, goal < 150/90. For now she agrees with continuing same dose of Benazepril. Continue low salt diet and monitoring BP regularly.  Lightheadedness We discussed possible etiologies. Examination today doe snot suggest a serious process. We had lab recently , so I do not think we need to repeat labs today. Fall precautions. Monitor for new symptoms. Clearly instructed about warning signs.  Return if symptoms worsen or fail to improve, for Keep next appt.   Adonay Scheier G. Martinique, MD  [facility]. [place] office.

## 2021-06-15 ENCOUNTER — Encounter: Payer: Self-pay | Admitting: Family Medicine

## 2021-06-15 ENCOUNTER — Ambulatory Visit (INDEPENDENT_AMBULATORY_CARE_PROVIDER_SITE_OTHER): Payer: Medicare Other | Admitting: Family Medicine

## 2021-06-15 ENCOUNTER — Other Ambulatory Visit: Payer: Self-pay

## 2021-06-15 VITALS — BP 120/76 | HR 80 | Resp 16 | Ht 65.0 in | Wt 144.4 lb

## 2021-06-15 DIAGNOSIS — G44309 Post-traumatic headache, unspecified, not intractable: Secondary | ICD-10-CM

## 2021-06-15 DIAGNOSIS — R42 Dizziness and giddiness: Secondary | ICD-10-CM

## 2021-06-15 DIAGNOSIS — I10 Essential (primary) hypertension: Secondary | ICD-10-CM | POA: Diagnosis not present

## 2021-06-15 NOTE — Patient Instructions (Addendum)
A few things to remember from today's visit:  Primary hypertension  Post-traumatic headache, not intractable, unspecified chronicity pattern - Plan: CT HEAD WO CONTRAST (5MM)  Lightheadedness  If you need refills please call your pharmacy. Do not use My Chart to request refills or for acute issues that need immediate attention.   For now no changes in blood pressure medications today. Goal blood pressure under 150/90. Fall precautions. Head CT will be arranged.  Please be sure medication list is accurate. If a new problem present, please set up appointment sooner than planned today.

## 2021-06-18 DIAGNOSIS — M9904 Segmental and somatic dysfunction of sacral region: Secondary | ICD-10-CM | POA: Diagnosis not present

## 2021-06-18 DIAGNOSIS — M9905 Segmental and somatic dysfunction of pelvic region: Secondary | ICD-10-CM | POA: Diagnosis not present

## 2021-06-18 DIAGNOSIS — M9901 Segmental and somatic dysfunction of cervical region: Secondary | ICD-10-CM | POA: Diagnosis not present

## 2021-06-18 DIAGNOSIS — M9903 Segmental and somatic dysfunction of lumbar region: Secondary | ICD-10-CM | POA: Diagnosis not present

## 2021-06-22 ENCOUNTER — Other Ambulatory Visit: Payer: Self-pay

## 2021-06-22 ENCOUNTER — Ambulatory Visit
Admission: RE | Admit: 2021-06-22 | Discharge: 2021-06-22 | Disposition: A | Discharge status: Home / Self Care | Payer: Medicare Other | Source: Ambulatory Visit | Attending: Family Medicine | Admitting: Family Medicine

## 2021-06-22 DIAGNOSIS — G44309 Post-traumatic headache, unspecified, not intractable: Secondary | ICD-10-CM

## 2021-06-22 DIAGNOSIS — R519 Headache, unspecified: Secondary | ICD-10-CM | POA: Diagnosis not present

## 2021-07-05 ENCOUNTER — Other Ambulatory Visit: Payer: Self-pay | Admitting: Family Medicine

## 2021-07-05 DIAGNOSIS — F419 Anxiety disorder, unspecified: Secondary | ICD-10-CM

## 2021-07-06 NOTE — Telephone Encounter (Signed)
Last filled 06/06/21 Last OV: 06/15/21 Next OV: 11/2021

## 2021-07-11 ENCOUNTER — Telehealth: Payer: Self-pay | Admitting: Pharmacist

## 2021-07-11 NOTE — Chronic Care Management (AMB) (Signed)
Chronic Care Management Pharmacy Assistant   Name: Crystal Levy  MRN: [id number] DOB: Nov 16, 1940  Reason for Encounter: Disease State / Hypertension Assessment Call    Conditions to be addressed/monitored: HTN  Recent office visits:  06/15/21 Martinique, Betty G, MD - Patient presented for Post- traumatic headache not intractable uspecified chronicity pattern and other concerns. Ordered CT of  Head w/o contrast  05/28/21 Martinique, Betty G, MD - Patient presented for Insomnia and other concerns. No medication Changes. Blood work ordered.  Recent consult visits:  None   Hospital visits:  None in previous 6 months  Medications: Outpatient Encounter Medications as of 07/11/2021  Medication Sig   alendronate (FOSAMAX) 70 MG tablet Take 1 tablet (70 mg total) by mouth every 7 (seven) days. Take with a full glass of water on an empty stomach.   Alpha-Lipoic Acid 100 MG TABS Take by mouth.   b complex vitamins tablet Take 1 tablet by mouth daily.   benazepril (LOTENSIN) 10 MG tablet TAKE 1 TABLET BY MOUTH  DAILY   calcium carbonate (OSCAL) 1500 (600 Ca) MG TABS tablet Take by mouth.   Cholecalciferol (VITAMIN D-3) 1000 UNITS CAPS Take 2 capsules by mouth daily.   citalopram (CELEXA) 20 MG tablet TAKE 1 TABLET BY MOUTH  DAILY   co-enzyme Q-10 50 MG capsule Take 50 mg by mouth daily.   DIGESTIVE ENZYMES PO Take by mouth.   levothyroxine (SYNTHROID) 88 MCG tablet TAKE 1 TABLET BY MOUTH  DAILY BEFORE BREAKFAST   LORazepam (ATIVAN) 1 MG tablet TAKE 1 TABLET BY MOUTH AT BEDTIME AS NEEDED FOR ANXIETY   Magnesium 100 MG CAPS Take by mouth.   NON FORMULARY Place under the tongue. [place] Flower Essence   potassium gluconate 595 MG TABS tablet Take 595 mg by mouth daily.   rosuvastatin (CRESTOR) 5 MG tablet TAKE 1 TABLET BY MOUTH  DAILY   Selenium (SELENIMIN PO) Take by mouth daily.   UNABLE TO FIND Olly Skin   UNABLE TO FIND Adrenal Metabolism Support   No facility-administered encounter  medications on file as of 07/11/2021.   Reviewed chart prior to disease state call. Spoke with patient regarding BP  Recent Office Vitals: BP Readings from Last 3 Encounters:  06/15/21 120/76  05/28/21 132/70  02/14/21 138/64   Pulse Readings from Last 3 Encounters:  06/15/21 80  05/28/21 75  02/14/21 63    Wt Readings from Last 3 Encounters:  06/15/21 144 lb 6 oz (65.5 kg)  05/28/21 143 lb 6 oz (65 kg)  02/14/21 150 lb (68 kg)     Kidney Function Lab Results  Component Value Date/Time   CREATININE 0.59 05/28/2021 09:56 AM   CREATININE 0.59 12/26/2020 08:02 AM   GFR 85.10 05/28/2021 09:56 AM   GFRNONAA >90 12/09/2014 09:29 AM   GFRAA >90 12/09/2014 09:29 AM    BMP Latest Ref Rng & Units 05/28/2021 12/26/2020 02/01/2020  Glucose 70 - 99 mg/dL 97 97 104(H)  BUN 6 - 23 mg/dL 7 13 12   Creatinine 0.40 - 1.20 mg/dL 0.59 0.59 0.52  Sodium 135 - 145 mEq/L 141 139 141  Potassium 3.5 - 5.1 mEq/L 4.2 4.2 4.0  Chloride 96 - 112 mEq/L 103 101 103  CO2 19 - 32 mEq/L 31 32 29  Calcium 8.4 - 10.5 mg/dL 9.0 8.8 9.2    Current antihypertensive regimen:  :Benazepril 10 mg daily How often are you checking your Blood Pressure? weekly Current home BP readings: Patient reports  home readings of   142/66 p 65 - 07/02/21 160/75 p 66 - 06/21/21  Today 160/77 p 68 Patient reports she has been having some headaches that are unusual for her, she reports she has not been checking as often as she should. Advised she is not happy with her recent readings however she reports she at one point was more around the 130 range but has been some time. Advised her to check once a week at least to have a better record of the trends in her readings and she may also when she's out at the drugstore have it checked there to be sure its not her machine. She reports that she had done that a few months ago and PCP aware. What recent interventions/DTPs have been made by any provider to improve Blood Pressure control  since last CPP Visit: Patient reports no change Any recent hospitalizations or ED visits since last visit with CPP? No What diet changes have been made to improve Blood Pressure Control?  Patient reports she does not use any salt when cooking or in any meals never has she eats almost all meals at home and not out. What exercise is being done to improve your Blood Pressure Control?  Patient reports she has lost 6 pounds has been exercising as much as she can.  Adherence Review: Is the patient currently on ACE/ARB medication? Yes Does the patient have >5 day gap between last estimated fill dates? No   Care Gaps: BP 120/76 (10/22 office) COVID Booster #5 [employer]) - Overdue AWV- 6/23 CCM- 2/23  Star Rating Drugs:  Benazepril (Losentin) 10 mg - Last filled 06/27/2021 90 DS at [facility] Rosuvastatin (Crestor) 5 mg - Last filled 05/28/2021 90 DS at [place] Pharmacist Assistant [phone]

## 2021-07-18 DIAGNOSIS — M25511 Pain in right shoulder: Secondary | ICD-10-CM | POA: Diagnosis not present

## 2021-07-30 DIAGNOSIS — K219 Gastro-esophageal reflux disease without esophagitis: Secondary | ICD-10-CM | POA: Diagnosis not present

## 2021-07-30 DIAGNOSIS — R1310 Dysphagia, unspecified: Secondary | ICD-10-CM | POA: Diagnosis not present

## 2021-07-30 DIAGNOSIS — K5900 Constipation, unspecified: Secondary | ICD-10-CM | POA: Diagnosis not present

## 2021-08-08 ENCOUNTER — Other Ambulatory Visit: Payer: Self-pay | Admitting: Family Medicine

## 2021-08-17 DIAGNOSIS — R1310 Dysphagia, unspecified: Secondary | ICD-10-CM | POA: Diagnosis not present

## 2021-08-27 ENCOUNTER — Other Ambulatory Visit: Payer: Self-pay | Admitting: Family Medicine

## 2021-08-27 DIAGNOSIS — F419 Anxiety disorder, unspecified: Secondary | ICD-10-CM

## 2021-08-29 ENCOUNTER — Telehealth: Payer: Self-pay | Admitting: Pharmacist

## 2021-08-29 NOTE — Chronic Care Management (AMB) (Signed)
Chronic Care Management Pharmacy Assistant   Name: Teneil Shiller  MRN: [id number] DOB: 1940-12-11  Reason for Encounter: Disease State   Conditions to be addressed/monitored: HTN  Recent office visits:  None  Recent consult visits:  08/17/21 Adolphus Birchwood - Patient presented for Swallowing test.  07/30/21 Ulysees Barns) - Patient presented for Constipation. No medication changes. Swallow Eval ordered.  Hospital visits:  None in previous 6 months  Medications: Outpatient Encounter Medications as of 08/29/2021  Medication Sig   alendronate (FOSAMAX) 70 MG tablet TAKE 1 TABLET BY MOUTH  WEEKLY WITH 8 OZ OF PLAIN  WATER 30 MINUTES BEFORE  FIRST FOOD, DRINK OR MEDS.  STAY UPRIGHT FOR 30 MINS   Alpha-Lipoic Acid 100 MG TABS Take by mouth.   b complex vitamins tablet Take 1 tablet by mouth daily.   benazepril (LOTENSIN) 10 MG tablet TAKE 1 TABLET BY MOUTH  DAILY   calcium carbonate (OSCAL) 1500 (600 Ca) MG TABS tablet Take by mouth.   Cholecalciferol (VITAMIN D-3) 1000 UNITS CAPS Take 2 capsules by mouth daily.   citalopram (CELEXA) 20 MG tablet TAKE 1 TABLET BY MOUTH  DAILY   co-enzyme Q-10 50 MG capsule Take 50 mg by mouth daily.   DIGESTIVE ENZYMES PO Take by mouth.   levothyroxine (SYNTHROID) 88 MCG tablet TAKE 1 TABLET BY MOUTH  DAILY BEFORE BREAKFAST   LORazepam (ATIVAN) 1 MG tablet TAKE 1 TABLET BY MOUTH AT BEDTIME AS NEEDED FOR ANXIETY   Magnesium 100 MG CAPS Take by mouth.   NON FORMULARY Place under the tongue. [place]relandra Flower Essence   potassium gluconate 595 MG TABS tablet Take 595 mg by mouth daily.   rosuvastatin (CRESTOR) 5 MG tablet TAKE 1 TABLET BY MOUTH  DAILY   Selenium (SELENIMIN PO) Take by mouth daily.   UNABLE TO FIND Olly Skin   UNABLE TO FIND Adrenal Metabolism Support   No facility-administered encounter medications on file as of 08/29/2021.  Reviewed chart prior to disease state call. Spoke with patient regarding BP  Recent  Office Vitals: BP Readings from Last 3 Encounters:  06/15/21 120/76  05/28/21 132/70  02/14/21 138/64   Pulse Readings from Last 3 Encounters:  06/15/21 80  05/28/21 75  02/14/21 63    Wt Readings from Last 3 Encounters:  06/15/21 144 lb 6 oz (65.5 kg)  05/28/21 143 lb 6 oz (65 kg)  02/14/21 150 lb (68 kg)     Kidney Function Lab Results  Component Value Date/Time   CREATININE 0.59 05/28/2021 09:56 AM   CREATININE 0.59 12/26/2020 08:02 AM   GFR 85.10 05/28/2021 09:56 AM   GFRNONAA >90 12/09/2014 09:29 AM   GFRAA >90 12/09/2014 09:29 AM    BMP Latest Ref Rng & Units 05/28/2021 12/26/2020 02/01/2020  Glucose 70 - 99 mg/dL 97 97 104(H)  BUN 6 - 23 mg/dL 7 13 12   Creatinine 0.40 - 1.20 mg/dL 0.59 0.59 0.52  Sodium 135 - 145 mEq/L 141 139 141  Potassium 3.5 - 5.1 mEq/L 4.2 4.2 4.0  Chloride 96 - 112 mEq/L 103 101 103  CO2 19 - 32 mEq/L 31 32 29  Calcium 8.4 - 10.5 mg/dL 9.0 8.8 9.2    Current antihypertensive regimen:  Benazepril 10 mg 1 tablet daily How often are you checking your Blood Pressure? 3-5x per week Current home BP readings: Patient reports her pressures have still remained on the higher side most recent reading 158/72. She reports the constant  headaches she had been having are gone every now and again she will get a sharp pain in the right temple every now and again. What recent interventions/DTPs have been made by any provider to improve Blood Pressure control since last CPP Visit: Patient reports no changes Any recent hospitalizations or ED visits since last visit with CPP? No What diet changes have been made to improve Blood Pressure Control?  Patient reports she has increased her fiber and water amounts. What exercise is being done to improve your Blood Pressure Control?  Patient reports she has been having some trouble with her getting up and mobility but still getting around. Notes: Patient reports she had started the Fosamax but was causing her to have  some sharp pains in her stomach where she had ulcers in the past so she stopped as of last Sunday, for now she is ok with not replacing it with anything else. Patient also reports she will be following up with Dr Reesa Chew for her swallow eval results as she chokes on things like water. For her blood pressures she reports she isnt happy with them being so high for years on her medication she had been in the 130's. She reports if a change in medication is needed she would like [facility] sent [facility] on [address] but if its just to be increased shed like it to [facility] to [facility]   Adherence Review: Is the patient currently on ACE/ARB medication? Yes Does the patient have >5 day gap between last estimated fill dates? No   Care Gaps: BP 120/76 (10/22 office) 158/72 (home) COVID Booster #5 [employer]) - Overdue AWV- 6/23 CCM- 2/23  Star Rating Drugs: Benazepril (Losentin) 10 mg - Last filled 06/26/2021 90 DS at [facility] Rosuvastatin (Crestor) 5 mg - Last filled 08/10/2021 90 DS at [place] Pharmacist Assistant [phone]

## 2021-09-12 ENCOUNTER — Telehealth: Payer: Self-pay

## 2021-09-12 ENCOUNTER — Telehealth: Payer: Self-pay | Admitting: Pharmacist

## 2021-09-12 MED ORDER — BENAZEPRIL HCL 20 MG PO TABS: 20.0000 mg | ORAL_TABLET | Freq: Every day | ORAL | 3 refills | Status: DC

## 2021-09-12 NOTE — Chronic Care Management (AMB) (Signed)
° ° °  Chronic Care Management Pharmacy Assistant   Name: Crystal Levy  MRN: [id number] DOB: 09-04-1941   Reason for Encounter: Per Staff msg from [name] offer pt appt with PCP in 4 wks for Benazepril increase    Recent office visits:  None  Recent consult visits:  None  Hospital visits:  None in previous 6 months  Medications: Outpatient Encounter Medications as of 09/12/2021  Medication Sig   alendronate (FOSAMAX) 70 MG tablet TAKE 1 TABLET BY MOUTH  WEEKLY WITH 8 OZ OF PLAIN  WATER 30 MINUTES BEFORE  FIRST FOOD, [place] OR MEDS.  STAY UPRIGHT FOR 30 MINS   Alpha-Lipoic Acid 100 MG TABS Take by mouth.   b complex vitamins tablet Take 1 tablet by mouth daily.   benazepril (LOTENSIN) 10 MG tablet TAKE 1 TABLET BY MOUTH  DAILY   calcium carbonate (OSCAL) 1500 (600 Ca) MG TABS tablet Take by mouth.   Cholecalciferol (VITAMIN D-3) 1000 UNITS CAPS Take 2 capsules by mouth daily.   citalopram (CELEXA) 20 MG tablet TAKE 1 TABLET BY MOUTH  DAILY   co-enzyme Q-10 50 MG capsule Take 50 mg by mouth daily.   DIGESTIVE ENZYMES PO Take by mouth.   levothyroxine (SYNTHROID) 88 MCG tablet TAKE 1 TABLET BY MOUTH  DAILY BEFORE BREAKFAST   LORazepam (ATIVAN) 1 MG tablet TAKE 1 TABLET BY MOUTH AT BEDTIME AS NEEDED FOR ANXIETY   Magnesium 100 MG CAPS Take by mouth.   NON FORMULARY Place under the tongue. [place] Flower Essence   potassium gluconate 595 MG TABS tablet Take 595 mg by mouth daily.   rosuvastatin (CRESTOR) 5 MG tablet TAKE 1 TABLET BY MOUTH  DAILY   Selenium (SELENIMIN PO) Take by mouth daily.   UNABLE TO FIND Olly Skin   UNABLE TO FIND Adrenal Metabolism Support   No facility-administered encounter medications on file as of 09/12/2021.   Notes:  Per staff message from Clinical Pharmacist PCP ok to increase Benazepril to 20 mg would like for patient to schedule follow up with her in 4 wks. Call to patient advised the above she accepted appointment for 10/10/21 at 12 and noted  she would like updated prescription to be sent to [facility] mail order. Advised Pharmacist  Care Gaps: BP 120/76 (10/22 office) 158/72 (home) COVID Booster #5 [employer]) - Overdue AWV- 6/23 CCM- 2/23  Star Rating Drugs: Benazepril (Losentin) 10 mg - Last filled 06/26/2021 90 DS at [facility] Rosuvastatin (Crestor) 5 mg - Last filled 08/10/2021 90 DS at [facility]   Patient Assistance: None  [place] Clinical Pharmacist Assistant [phone]

## 2021-09-12 NOTE — Telephone Encounter (Signed)
-----   Message from Viona Gilmore, [facility] sent at 09/12/2021  8:47 AM EST ----- Regarding: FW: BP readings I spoke with Dr. Martinique about this dose increase and informed the patient. Can you please send a new prescription for the 20 mg dose to [facility] rx? She will double up in the meantime.  Thank you! Maddie ----- Message ----- From: Martinique, Betty G, MD Sent: 09/11/2021   9:58 AM EST To: Viona Gilmore, RPH Subject: RE: BP readings                                It is appropriate to increase Benazepril dose from 10 mg to 20 mg. Please have her arranging f/u appt with me in 4 weeks. Thanks, BJ ----- Message ----- From: Viona Gilmore, [facility] Sent: 09/10/2021  12:00 AM EST To: [name], MD Subject: BP readings                                    Hi,  It appears that Crystal Levy is still having higher blood pressure readings, more consistently running in the 150s with a recent reading of 158/72. She reports the constant headaches she had been having are gone every now and again she will get a sharp pain in her right temple. What do you think about increasing her benazepril? She is on only 10 mg of benazepril.  Let me know and I will reach out to her again with any changes! Best, Maddie

## 2021-10-04 ENCOUNTER — Other Ambulatory Visit: Payer: Self-pay | Admitting: Family Medicine

## 2021-10-09 NOTE — Progress Notes (Signed)
[name] is a 81 y.o.female, who is here today to follow on HTN.  Last follow up visit: 05/28/21. Since her last visit she has seen GI for dysphagia. She is reporting negative swallowing study.  Hypertension:  Medications:Benazepril 20 mg daily at bedtime. BP readings at home:Some SBP's have been elevated 150's-160's. She has had 120-130's. DBP 60-80's. Side effects:None. Negative for unusual or severe headache, visual changes, exertional chest pain, dyspnea, new focal weakness, or edema.  Lab Results  Component Value Date   CREATININE 0.59 05/28/2021   BUN 7 05/28/2021   NA 141 05/28/2021   K 4.2 05/28/2021   CL 103 05/28/2021   CO2 31 05/28/2021   PAD: She started a new supplement that is supposed to help with circulation. No calf pain with ambulation. Left calf cramps sometimes. RLE feels cold during winter, this has been unchanged for years. She has not noted color changes or skin lesions.  Last ABI 03/30/20:  Right: Resting right ankle-brachial index indicates moderate right lower extremity arterial disease. The right toe-brachial index is abnormal.  Left: Resting left ankle-brachial index is within normal range. The left toe-brachial index is abnormal.   She is on Rosuvastatin 5 mg daily. She is eating more vegetables, fruits,and fish.  Smoothie in the morning with a protein powder supplement.  Osteopenia: Stopped fosamax because medication was causing jaw pain and abdominal pain, both resolved after discontinuing medication. She is on calcium and vitamin D supplementation.  Anxiety and insomnia: Currently she is on Celexa 20 mg daily and lorazepam 1 mg 1/2 to 1 tablet daily at bedtime.  She is sleeping well, 8 hours at least. She has trouble falling asleep but once she falls asleep she sleeps through the night. She has tolerated medication well.  She is also concerned about more prominent area on anterior aspect of her neck. She has history of  enlarged thyroid. Hypothyroidism on levothyroxine 88 mcg daily.  Lab Results  Component Value Date   TSH 3.11 12/26/2020   Thyroid US done in 03/2014: 6 mm solid nodule left side, heterogeneous gland.  He was seeing endocrinologist and no further follow-up was deemed necessary at that time.  Review of Systems  Constitutional:  Negative for activity change, appetite change and fever.  HENT:  Negative for mouth sores, nosebleeds and sore throat.   Respiratory:  Negative for cough and wheezing.   Gastrointestinal:  Negative for abdominal pain, nausea and vomiting.       Negative for changes in bowel habits.  Genitourinary:  Negative for decreased urine volume and hematuria.  Musculoskeletal:  Positive for arthralgias and gait problem.  Neurological:  Negative for syncope and facial asymmetry.  Psychiatric/Behavioral:  Negative for confusion. The patient is nervous/anxious.   Rest see pertinent positives and negatives per HPI.  Current Outpatient Medications on File Prior to Visit  Medication Sig Dispense Refill   Alpha-Lipoic Acid 100 MG TABS Take by mouth.     b complex vitamins tablet Take 1 tablet by mouth daily.     calcium carbonate (OSCAL) 1500 (600 Ca) MG TABS tablet Take by mouth.     Cholecalciferol (VITAMIN D-3) 1000 UNITS CAPS Take 2 capsules by mouth daily.     citalopram (CELEXA) 20 MG tablet TAKE 1 TABLET BY MOUTH  DAILY 90 tablet 3   co-enzyme Q-10 50 MG capsule Take 50 mg by mouth daily.     DIGESTIVE ENZYMES PO Take by mouth.     levothyroxine (SYNTHROID)  88 MCG tablet TAKE 1 TABLET BY MOUTH  DAILY BEFORE BREAKFAST 90 tablet 2   LORazepam (ATIVAN) 1 MG tablet TAKE 1 TABLET BY MOUTH AT BEDTIME AS NEEDED FOR ANXIETY 30 tablet 3   Magnesium 100 MG CAPS Take by mouth.     potassium gluconate 595 MG TABS tablet Take 595 mg by mouth daily.     rosuvastatin (CRESTOR) 5 MG tablet TAKE 1 TABLET BY MOUTH  DAILY 90 tablet 3   Selenium (SELENIMIN PO) Take by mouth daily.      UNABLE TO FIND Olly Skin     UNABLE TO FIND Adrenal Metabolism Support     No current facility-administered medications on file prior to visit.   Past Medical History:  Diagnosis Date   Anxiety    Arthritis    Bicuspid aortic valve    Depression    Hypertension    Hypothyroidism    Polio    Thyroid disease     Allergies  Allergen Reactions   Clindamycin/Lincomycin Diarrhea and Other (See Comments)    C-Diff   Fluticasone     Headache and nosebleed    Azithromycin    Eggs Or Egg-Derived Products    Milk-Related Compounds     Unknown reaction   Penicillins Rash   Sulfa Antibiotics Diarrhea   Sulfamethoxazole-Trimethoprim    Fenofibrate Rash    Social History   Socioeconomic History   Marital status: Divorced    Spouse name: Not on file   Number of children: 2   Years of education: Not on file   Highest education level: Not on file  Occupational History    Employer: [employer]  Tobacco Use   Smoking status: Never   Smokeless tobacco: Never   Tobacco comments:    Rare smoker in the distant past.   Substance and Sexual Activity   Alcohol use: Yes    Alcohol/week: 0.0 standard drinks    Comment: wine   Drug use: No   Sexual activity: Not on file  Other Topics Concern   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    Social Determinants of Health   Financial Resource Strain: Low Risk    Difficulty of Paying Living Expenses: Not hard at all  Food Insecurity: No Food Insecurity   Worried About Charity fundraiser in the Last Year: Never true   E. Lopez in the Last Year: Never true  Transportation Needs: No Transportation Needs   Lack of Transportation (Medical): No   Lack of Transportation (Non-Medical): No  Physical Activity: Inactive   Days of Exercise per Week: 0 days   Minutes of Exercise per Session: 0 min  Stress: No Stress Concern Present   Feeling of Stress : Not at all  Social  Connections: Socially Isolated   Frequency of Communication with Friends and Family: Twice a week   Frequency of Social Gatherings with Friends and Family: Twice a week   Attends Religious Services: Never   Marine scientist or Organizations: No   Attends Archivist Meetings: Never   Marital Status: Divorced   Vitals:   10/10/21 1154  BP: (!) 146/62  Pulse: 89  Resp: 16  SpO2: 97%   Body mass index is 23.86 kg/m.  Physical Exam Vitals and nursing note reviewed.  Constitutional:      General: She is not in acute distress.    Appearance: She is well-developed.  HENT:     Head: Normocephalic and atraumatic.     Mouth/Throat:     Mouth: Mucous membranes are moist.     Pharynx: Oropharynx is clear.  Eyes:     Conjunctiva/sclera: Conjunctivae normal.  Neck:     Thyroid: Thyromegaly present.   Cardiovascular:     Rate and Rhythm: Normal rate and regular rhythm.     Pulses:          Dorsalis pedis pulses are 1+ on the right side and 2+ on the left side.     Heart sounds: Murmur (Soft SEM RUSB-LUSB) heard.  Pulmonary:     Effort: Pulmonary effort is normal. No respiratory distress.     Breath sounds: Normal breath sounds.  Abdominal:     Palpations: Abdomen is soft. There is no hepatomegaly or mass.     Tenderness: There is no abdominal tenderness.  Lymphadenopathy:     Cervical: No cervical adenopathy.  Skin:    General: Skin is warm.     Findings: No erythema or rash.  Neurological:     Mental Status: She is alert and oriented to person, place, and time.     Cranial Nerves: No cranial nerve deficit.     Comments: RLE weakness and muscle atrophy. Mildly unstable gait, not assisted.  Psychiatric:     Comments: Well groomed, good eye contact.   ASSESSMENT AND PLAN:   Ms.Taelar was seen today for follow-up.  Diagnoses and all orders for this visit: Orders Placed This Encounter  Procedures   US THYROID   Basic metabolic panel   Lab Results   Component Value Date   CREATININE 0.60 10/10/2021   BUN 15 10/10/2021   NA 138 10/10/2021   K 4.5 10/10/2021   CL 101 10/10/2021   CO2 33 (H) 10/10/2021   PAD (peripheral artery disease) ([place]) She is not interested in further work up or vascular evaluation. She is asymptomatic. Peripheral pulses are palpable. Continue Rosuvastatin 5 mg daily. Instructed about warning signs.  HTN (hypertension) BP mildly elevated. Benazepril dose increased from 20 mg to 30 mg, add 10 mg (1/2 tab) Am and continue 20 mg at night. Continue low salt diet and monitoring BP regularly.  Insomnia Problem is stable. Continue good sleep hygiene. Continue Lorazepam 1 mg 1/2-1 tab at bedtime as needed.   Thyroid nodule She is concerned about growth. Thyroid US will be arranged.  DNR (do not resuscitate) She voices understanding of DNR status. Form completed and signed.  Osteopenia She did not tolerate Fosamax well. For now she is not interested in new medication. Continue Ca++ and Vit D supplementation. Fall precautions and regular physical activity.  Anxiety disorder, unspecified Problem is stable. Continue Celexa 20 mg daily and Lorazepam 1 mg 1/2-1 tab daily prn.  I spent a total of 48 minutes in both face to face and non face to face activities for this visit on the date of this encounter. During this time history was obtained and documented, examination was performed, prior labs/imaging reviewed, and assessment/plan discussed.  Return in about 3 months (around 01/07/2022).  Kentaro Alewine G. [place], MD  [facility]. [place] office.

## 2021-10-10 ENCOUNTER — Encounter: Payer: Self-pay | Admitting: Family Medicine

## 2021-10-10 ENCOUNTER — Ambulatory Visit (INDEPENDENT_AMBULATORY_CARE_PROVIDER_SITE_OTHER): Payer: Medicare Other | Admitting: Family Medicine

## 2021-10-10 VITALS — BP 146/62 | HR 89 | Resp 16 | Ht 65.0 in | Wt 143.4 lb

## 2021-10-10 DIAGNOSIS — I10 Essential (primary) hypertension: Secondary | ICD-10-CM | POA: Diagnosis not present

## 2021-10-10 DIAGNOSIS — I739 Peripheral vascular disease, unspecified: Secondary | ICD-10-CM

## 2021-10-10 DIAGNOSIS — M8580 Other specified disorders of bone density and structure, unspecified site: Secondary | ICD-10-CM | POA: Diagnosis not present

## 2021-10-10 DIAGNOSIS — E041 Nontoxic single thyroid nodule: Secondary | ICD-10-CM

## 2021-10-10 DIAGNOSIS — G4700 Insomnia, unspecified: Secondary | ICD-10-CM

## 2021-10-10 DIAGNOSIS — F419 Anxiety disorder, unspecified: Secondary | ICD-10-CM | POA: Diagnosis not present

## 2021-10-10 DIAGNOSIS — Z66 Do not resuscitate: Secondary | ICD-10-CM | POA: Diagnosis not present

## 2021-10-10 LAB — BASIC METABOLIC PANEL
BUN: 15 mg/dL (ref 6–23)
CO2: 33 mEq/L — ABNORMAL HIGH (ref 19–32)
Calcium: 9.6 mg/dL (ref 8.4–10.5)
Chloride: 101 mEq/L (ref 96–112)
Creatinine, Ser: 0.6 mg/dL (ref 0.40–1.20)
GFR: 84.54 mL/min (ref >60.00)
Glucose, Bld: 88 mg/dL (ref 70–99)
Potassium: 4.5 mEq/L (ref 3.5–5.1)
Sodium: 138 mEq/L (ref 135–145)

## 2021-10-10 MED ORDER — BENAZEPRIL HCL 20 MG PO TABS: ORAL_TABLET | ORAL | 1 refills | Status: DC

## 2021-10-10 NOTE — Assessment & Plan Note (Signed)
She did not tolerate Fosamax well. For now she is not interested in new medication. Continue Ca++ and Vit D supplementation. Fall precautions and regular physical activity.

## 2021-10-10 NOTE — Assessment & Plan Note (Addendum)
She voices understanding of DNR status. Form completed and signed.

## 2021-10-10 NOTE — Assessment & Plan Note (Signed)
Problem is stable. Continue Celexa 20 mg daily and Lorazepam 1 mg 1/2-1 tab daily prn.

## 2021-10-10 NOTE — Assessment & Plan Note (Signed)
Problem is stable. Continue good sleep hygiene. Continue Lorazepam 1 mg 1/2-1 tab at bedtime as needed.

## 2021-10-10 NOTE — Patient Instructions (Addendum)
A few things to remember from today's visit:  Primary hypertension - Plan: Basic metabolic panel  Insomnia, unspecified type  Anxiety disorder, unspecified type  PAD (peripheral artery disease) ([place]), Chronic  Enlarged thyroid gland - Plan: US THYROID  If you need refills please call your pharmacy. Do not use My Chart to request refills or for acute issues that need immediate attention.   Benazepril 20 mg 1/2 tab to add in the morning and continue 20 mg at night to improve blood pressure. Fasting labs next visit.  Please be sure medication list is accurate. If a new problem present, please set up appointment sooner than planned today.

## 2021-10-10 NOTE — Assessment & Plan Note (Signed)
She is concerned about growth. Thyroid US will be arranged.

## 2021-10-10 NOTE — Assessment & Plan Note (Signed)
She is not interested in further work up or vascular evaluation. She is asymptomatic. Peripheral pulses are palpable. Continue Rosuvastatin 5 mg daily. Instructed about warning signs.

## 2021-10-10 NOTE — Assessment & Plan Note (Signed)
BP mildly elevated. Benazepril dose increased from 20 mg to 30 mg, add 10 mg (1/2 tab) Am and continue 20 mg at night. Continue low salt diet and monitoring BP regularly.

## 2021-10-12 ENCOUNTER — Telehealth: Payer: Self-pay | Admitting: Pharmacist

## 2021-10-12 NOTE — Chronic Care Management (AMB) (Signed)
° ° °  Chronic Care Management Pharmacy Assistant   Name: Crystal Levy  MRN: [id number] DOB: 1941-06-20  10/12/21 APPOINTMENT REMINDER   Patient was reminded to have all medications, supplements and any blood glucose and blood pressure readings available for review with [name], Pharm. D, for telephone visit on 10/15/21 at 10:30.   Care Gaps: BP 142/62 ( 10/10/21) AWV- 6/23 CCM- 2/23  Star Rating Drug: Benazepril ([place]) 10 mg - Last filled 09/12/21 90 DS at [facility] Rosuvastatin (Crestor) 5 mg - Last filled 08/10/2021 90 DS at [facility]  Any gaps in medications fill history? None    Medications: Outpatient Encounter Medications as of 10/12/2021  Medication Sig   Alpha-Lipoic Acid 100 MG TABS Take by mouth.   b complex vitamins tablet Take 1 tablet by mouth daily.   benazepril (LOTENSIN) 20 MG tablet 0.5 tab am and 1 tab pm   calcium carbonate (OSCAL) 1500 (600 Ca) MG TABS tablet Take by mouth.   Cholecalciferol (VITAMIN D-3) 1000 UNITS CAPS Take 2 capsules by mouth daily.   citalopram (CELEXA) 20 MG tablet TAKE 1 TABLET BY MOUTH  DAILY   co-enzyme Q-10 50 MG capsule Take 50 mg by mouth daily.   DIGESTIVE ENZYMES PO Take by mouth.   levothyroxine (SYNTHROID) 88 MCG tablet TAKE 1 TABLET BY MOUTH  DAILY BEFORE BREAKFAST   LORazepam (ATIVAN) 1 MG tablet TAKE 1 TABLET BY MOUTH AT BEDTIME AS NEEDED FOR ANXIETY   Magnesium 100 MG CAPS Take by mouth.   potassium gluconate 595 MG TABS tablet Take 595 mg by mouth daily.   rosuvastatin (CRESTOR) 5 MG tablet TAKE 1 TABLET BY MOUTH  DAILY   Selenium (SELENIMIN PO) Take by mouth daily.   UNABLE TO FIND Olly Skin   UNABLE TO FIND Adrenal Metabolism Support   No facility-administered encounter medications on file as of 10/12/2021.    [place] Clinical Pharmacist Assistant [phone]

## 2021-10-15 ENCOUNTER — Ambulatory Visit (INDEPENDENT_AMBULATORY_CARE_PROVIDER_SITE_OTHER): Payer: Medicare Other | Admitting: Pharmacist

## 2021-10-15 DIAGNOSIS — I10 Essential (primary) hypertension: Secondary | ICD-10-CM

## 2021-10-15 DIAGNOSIS — G4700 Insomnia, unspecified: Secondary | ICD-10-CM

## 2021-10-15 NOTE — Progress Notes (Cosign Needed)
Chronic Care Management Pharmacy Note  10/15/2021 Name:  Crystal Levy MRN:  [id number] DOB:  [birth year]  Summary: LDL not at goal < 70 Pt has not started the higher dose of benazepril yet  Recommendations/Changes made from today's visit: -Recommended increasing benazepril as recommended from PCP visit last week -Recommended monitoring BP when having headaches and bringing BP cuff to next office visit to ensure accuracy -Recommended for patient to inquire about aquatic therapy with insurance about coverage  Plan: Follow up in 3-4 weeks for BP assessment Follow up in 4 months after PCP visit   Subjective: Crystal Levy is an 81 y.o. year old female who is a primary patient of Martinique, Malka So, MD.  The CCM team was consulted for assistance with disease management and care coordination needs.    Engaged with patient by telephone for follow up visit in response to provider referral for pharmacy case management and/or care coordination services.   Consent to Services:  The patient was given information about Chronic Care Management services, agreed to services, and gave verbal consent prior to initiation of services.  Please see initial visit note for detailed documentation.   Patient Care Team: Martinique, Betty G, MD as PCP - General (Family Medicine) Viona Gilmore, [facility] as Pharmacist (Pharmacist)  Recent office visits: 10/10/21 [name], MD: Patient presented for HTN follow up. Increased benazepril to 1/2 tablet in the morning and 20 mg at night.   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 Ordered CT of head w/o contrast.    05/28/21 Martinique, Betty G, MD: Patient presented for Insomnia and other concerns. No medication Changes. Blood work ordered.  02/14/21 Randel Pigg, LPN: Patient presented for AWV.  Recent consult visits: 08/17/21 Adolphus Birchwood - Patient presented for  Swallowing test.   07/30/21 Ulysees Barns) - Patient presented for Constipation. No medication changes. Swallow Eval ordered.  Hospital visits: None in previous 6 months   Objective:  Lab Results  Component Value Date   CREATININE 0.60 10/10/2021   BUN 15 10/10/2021   GFR 84.54 10/10/2021   GFRNONAA >90 12/09/2014   GFRAA >90 12/09/2014   NA 138 10/10/2021   K 4.5 10/10/2021   CALCIUM 9.6 10/10/2021   CO2 33 (H) 10/10/2021   GLUCOSE 88 10/10/2021    Lab Results  Component Value Date/Time   GFR 84.54 10/10/2021 12:44 PM   GFR 85.10 05/28/2021 09:56 AM    Last diabetic Eye exam: No results found for: HMDIABEYEEXA  Last diabetic Foot exam: No results found for: HMDIABFOOTEX   Lab Results  Component Value Date   CHOL 151 05/28/2021   HDL 45.60 05/28/2021   LDLCALC 78 05/28/2021   LDLDIRECT 111.0 12/26/2020   TRIG 135.0 05/28/2021   CHOLHDL 3 05/28/2021    Hepatic Function Latest Ref Rng & Units 12/26/2020  Total Protein 6.0 - 8.3 g/dL 6.1  Albumin 3.5 - 5.2 g/dL 3.9  AST 0 - 37 U/L 16  ALT 0 - 35 U/L 12  Alk Phosphatase 39 - 117 U/L 103  Total Bilirubin 0.2 - 1.2 mg/dL 0.6    Lab Results  Component Value Date/Time   TSH 3.11 12/26/2020 08:02 AM   TSH 0.50 02/01/2020 10:29 AM    CBC Latest Ref Rng & Units 12/09/2014 02/19/2012  WBC 4.0 - 10.5 K/uL 6.4 8.5  Hemoglobin 12.0 - 15.0 g/dL 13.9 14.3  Hematocrit 36.0 -  46.0 % 41.9 42.5  Platelets 150 - 400 K/uL 298 300    No results found for: VD25OH  Clinical ASCVD: No  The ASCVD Risk score (Arnett DK, et al., 2019) failed to calculate for the following reasons:   The 2019 ASCVD risk score is only valid for ages 97 to 76    Depression screen PHQ 2/9 10/10/2021 02/14/2021 02/14/2021  Decreased Interest 0 0 0  Down, Depressed, Hopeless 0 0 0  PHQ - 2 Score 0 0 0  Altered sleeping 1 - -  Tired, decreased energy 1 - -  Change in appetite 0 - -  Feeling bad or failure about yourself  0 - -  Trouble  concentrating 0 - -  Moving slowly or fidgety/restless 3 - -  Suicidal thoughts 0 - -  PHQ-9 Score 5 - -  Difficult doing work/chores Somewhat difficult - -      Social History   Tobacco Use  Smoking Status Never  Smokeless Tobacco Never  Tobacco Comments   Rare smoker in the distant past.    BP Readings from Last 3 Encounters:  10/10/21 (!) 146/62  06/15/21 120/76  05/28/21 132/70   Pulse Readings from Last 3 Encounters:  10/10/21 89  06/15/21 80  05/28/21 75   Wt Readings from Last 3 Encounters:  10/10/21 143 lb 6 oz (65 kg)  06/15/21 144 lb 6 oz (65.5 kg)  05/28/21 143 lb 6 oz (65 kg)   BMI Readings from Last 3 Encounters:  10/10/21 23.86 kg/m  06/15/21 24.03 kg/m  05/28/21 23.86 kg/m    Assessment/Interventions: Review of patient past medical history, allergies, medications, health status, including review of consultants reports, laboratory and other test data, was performed as part of comprehensive evaluation and provision of chronic care management services.   SDOH:  (Social Determinants of Health) assessments and interventions performed: No   SDOH Screenings   Alcohol Screen: Low Risk    Last Alcohol Screening Score (AUDIT): 1  Depression (PHQ2-9): Medium Risk   PHQ-2 Score: 5  Financial Resource Strain: Low Risk    Difficulty of Paying Living Expenses: Not hard at all  Food Insecurity: No Food Insecurity   Worried About Programme researcher, broadcasting/film/video in the Last Year: Never true   Ran Out of Food in the Last Year: Never true  Housing: Low Risk    Last Housing Risk Score: 0  Physical Activity: Inactive   Days of Exercise per Week: 0 days   Minutes of Exercise per Session: 0 min  Social Connections: Socially Isolated   Frequency of Communication with Friends and Family: Twice a week   Frequency of Social Gatherings with Friends and Family: Twice a week   Attends Religious Services: Never   Database administrator or Organizations: No   Attends Museum/gallery exhibitions officer: Never   Marital Status: Divorced  Stress: No Stress Concern Present   Feeling of Stress : Not at all  Tobacco Use: Low Risk    Smoking Tobacco Use: Never   Smokeless Tobacco Use: Never   Passive Exposure: Not on file  Transportation Needs: No Transportation Needs   Lack of Transportation (Medical): No   Lack of Transportation (Non-Medical): No     CCM Care Plan  Allergies  Allergen Reactions   Clindamycin/Lincomycin Diarrhea and Other (See Comments)    C-Diff   Fluticasone     Headache and nosebleed    Azithromycin    Eggs Or Egg-Derived Products  Milk-Related Compounds     Unknown reaction   Penicillins Rash   Sulfa Antibiotics Diarrhea   Sulfamethoxazole-Trimethoprim    Fenofibrate Rash    Medications Reviewed Today     Reviewed by Martinique, Betty G, MD (Physician) on 10/10/21 at 1200  Med List Status: <None>   Medication Order Taking? Sig Documenting Provider Last Dose Status Informant    Discontinued 10/10/21 1154   Alpha-Lipoic Acid 100 MG TABS [id number] Yes Take by mouth. [provider] Taking Active   b complex vitamins tablet [id number] Yes Take 1 tablet by mouth daily. [provider] Taking Active   benazepril (LOTENSIN) 20 MG tablet [id number] Yes Take 1 tablet (20 mg total) by mouth daily. Martinique, Betty G, MD Taking Active   calcium carbonate (OSCAL) 1500 (600 Ca) MG TABS tablet [id number] Yes Take by mouth. [provider] Taking Active   Cholecalciferol (VITAMIN D-3) 1000 UNITS CAPS [id number] Yes Take 2 capsules by mouth daily. [provider] Taking Active   citalopram (CELEXA) 20 MG tablet [id number] Yes TAKE 1 TABLET BY MOUTH  DAILY Martinique, Betty G, MD Taking Active   co-enzyme Q-10 50 MG capsule [id number] Yes Take 50 mg by mouth daily. [provider] Taking Active   DIGESTIVE ENZYMES PO [id number] Yes Take by mouth. [provider] Taking Active   levothyroxine (SYNTHROID) 88 MCG  tablet [id number] Yes TAKE 1 TABLET BY MOUTH  DAILY BEFORE BREAKFAST Martinique, Betty G, MD Taking Active   LORazepam (ATIVAN) 1 MG tablet [id number] Yes TAKE 1 TABLET BY MOUTH AT BEDTIME AS NEEDED FOR ANXIETY Martinique, Betty G, MD Taking Active   Magnesium 100 MG CAPS [id number] Yes Take by mouth. [provider] Taking Active     Discontinued 10/10/21 1154   potassium gluconate 595 MG TABS tablet [id number] Yes Take 595 mg by mouth daily. [provider] Taking Active   rosuvastatin (CRESTOR) 5 MG tablet [id number] Yes TAKE 1 TABLET BY MOUTH  DAILY Martinique, Betty G, MD Taking Active   Selenium (SELENIMIN PO) [id number] Yes Take by mouth daily. [provider] Taking Active   UNABLE TO FIND [id number] Yes Olly Skin [provider] Taking Active   UNABLE TO FIND [id number] Yes Adrenal Metabolism Support [provider] Taking Active             Patient Active Problem List   Diagnosis Date Noted   Thyroid nodule 10/10/2021   DNR (do not resuscitate) 10/10/2021   Osteopenia 10/10/2021   Generalized osteoarthritis of multiple sites 04/11/2020   Insomnia 03/21/2020   GERD (gastroesophageal reflux disease) 02/01/2020   Anxiety disorder, unspecified 01/31/2020   PAD (peripheral artery disease) ([place]) 01/31/2020   Hyperlipidemia 08/30/2019   Hypothyroidism (acquired) 03/01/2019   Primary osteoarthritis of left knee 12/20/2014   HTN (hypertension) 04/29/2013   Aortic stenosis 02/06/2013    Immunization History  Administered Date(s) Administered   Fluad Quad(high Dose 65+) 05/28/2021   Influenza Split 09/07/2018, 04/28/2019   Influenza, High Dose Seasonal PF 08/31/2018, 06/05/2020   PFIZER(Purple Top)SARS-COV-2 Vaccination 09/10/2019, 10/21/2019, 06/19/2020, 12/08/2020   Pfizer Covid-19 Vaccine Bivalent Booster 57yrs & up 06/22/2021   Pneumococcal Conjugate-13 01/27/2014   Pneumococcal Polysaccharide-23 09/10/2007   Pneumococcal-Unspecified 09/09/2008    Tdap 09/09/2009, 01/16/2010   Zoster Recombinat (Shingrix) 12/26/2016, 04/12/2017   Zoster, Live 09/09/2009, 03/15/2013   Patient is having burping and had ulcers in the past which is why she is still taking pantoprazole but is trying to take the least amount possible.  Patient also reports she has a sharp headache in her right temple and doesn't happen really often but hasn't mentioned to her PCP previously as she wasn't sure if it was concerning or not. Recommended checking BP when this happens.   Conditions to be addressed/monitored:  Hypertension, Hyperlipidemia, GERD, Hypothyroidism, Anxiety, Osteoporosis, Osteoarthritis, and Insomnia  Conditions addressed this visit: Hypertension, hyperlipidemia, GERD  Care Plan : CCM Pharmacy Care Plan  Updates made by Viona Gilmore, [place] since 10/15/2021 12:00 AM     Problem: Problem: Hypertension, Hyperlipidemia, GERD, Hypothyroidism, Anxiety, Osteoporosis, Osteoarthritis, and Insomnia      Long-Range Goal: Patient-Specific Goal   Start Date: 03/02/2021  Expected End Date: 03/02/2022  Recent Progress: On track  Priority: High  Note:   Current Barriers:  Unable to independently monitor therapeutic efficacy Unable to achieve control of cholesterol  Unable to maintain control of blood pressure  Pharmacist Clinical Goal(s):  Patient will achieve adherence to monitoring guidelines and medication adherence to achieve therapeutic efficacy achieve control of cholesterol as evidenced by next lipid panel maintain control of blood pressure as evidenced by home and office BP readings  through collaboration with PharmD and provider.   Interventions: 1:1 collaboration with Martinique, Betty G, MD regarding development and update of comprehensive plan of care as evidenced by provider attestation and co-signature Inter-disciplinary care team collaboration (see longitudinal plan of care) Comprehensive medication review performed; medication list updated  in electronic medical record  Hypertension (BP goal <140/90) -Not ideally controlled -Current treatment: Benazepril 20 mg 1/2 tablet in the morning and 1 tablet at bedtime - Appropriate, Query effective, Safe, Accessible -Medications previously tried: none  -Current home readings: 147/72, 166/74, 135/97 (checking every other day - didn't check device at home)  -Current dietary habits: eating leaner meat and fruits and vegetables -Current exercise habits: walking every day -Denies hypotensive/hypertensive symptoms -Educated on BP goals and benefits of medications for prevention of heart attack, stroke and kidney damage; Exercise goal of 150 minutes per week; Importance of home blood pressure monitoring; Proper BP monitoring technique; -Counseled to monitor BP at home weekly, document, and provide log at future appointments -Counseled on diet and exercise extensively Recommended to continue current medication  Hyperlipidemia: (LDL goal < 70) -Not ideally controlled -Current treatment: Rosuvastatin 5 mg 1 tablet daily - Appropriate, Query effective, Safe, Accessible -Medications previously tried: none  -Current dietary patterns: not eating fried foods and uses olive oil when cooking; drinks 1 glass of wine 4 nights a week; eating a lot more simple foods -Current exercise habits: walking; going to a chiropractor -Educated on Cholesterol goals;  Benefits of statin for ASCVD risk reduction; Importance of limiting foods high in cholesterol; Exercise goal of 150 minutes per week; -Counseled on diet and exercise extensively  PAD (Goal: prevent heart events and improve circulation) -Controlled -Current treatment  No medications -Medications previously tried: aspirin (bruising) -Recommended to continue current medication Counseled on benefits of taking aspirin given PAD indication to prevent heart events.  Depression/Anxiety/Insomnia (Goal: minimize symptoms and improve sleep) -Not  ideally controlled -Current treatment: Citalopram 20 mg 1 tablet daily - Appropriate, Effective, Safe, Accessible Lorazepam 1 mg 1 tablet at bedtime as needed - Appropriate, Effective, Query Safe, Accessible -Medications previously tried/failed: none -PHQ9: 8 -GAD7: 7 -Educated on Benefits of medication for symptom control -Counseled on risk of falls with use of lorazepam Recommended discussion with PCP about alternative agents for sleep.   Osteopenia (Goal prevent fractures) -Controlled -Last DEXA Scan: 11/17/20  T-Score femoral  neck: -2.1  T-Score total hip: n/a  T-Score lumbar spine: 0.7  T-Score forearm radius: -1.5  10-year probability of major osteoporotic fracture: 16  10-year probability of hip fracture: 4.6% -Patient is not a candidate for pharmacologic treatment -Current treatment  Calcium-vit D-vit K 650-12.5 mcg 1 chew daily - Appropriate, Effective, Safe, Accessible Vitamin D 1000 units 2 capsules daily - Appropriate, Effective, Safe, Accessible -Medications previously tried: Alendronate (side effects) -Recommend [phone] units of vitamin D daily. Recommend 1200 mg of calcium daily from dietary and supplemental sources. Recommend weight-bearing and muscle strengthening exercises for building and maintaining bone density. -Counseled on diet and exercise extensively Recommended to continue current medication  Hypothyroidism (Goal: TSH 2.5-4.5 based on osteoporosis) -Controlled -Current treatment  Levothyroxine 88 mcg 1 tablet daily before breakfast - Appropriate, Effective, Safe, Accessible -Medications previously tried: none  -Recommended to continue current medication  GERD (Goal: minimize symptoms) -Controlled -Current treatment  Pantoprazole 40 mg 1 tablet three times a week - Appropriate, Effective, Safe, Accessible -Medications previously tried: none  -Counseled on duration of therapy with PPIs and long term risks of taking.   Health Maintenance -Vaccine  gaps: tetanus -Current therapy:  Coenzyme Q10 50 mg 1 capsule daily Digestive enzymes Magnesium 100 mg 1 capsule daily Multivitamin 1 tablet daily Potassium gluconate 595 mg 1 tablet daily Selenium daily Milk thistle extract 175 mg 1 tablet daily Milk of magnesia as needed Biotin 1000 mcg 1 tablet daily Vitamin B complex daily Alpha lipoic acid 100 mg 1 tablet daily Olly skin - hyaluronic acid Circularity supplement daily -Educated on Herbal supplement research is limited and benefits usually cannot be proven Cost vs benefit of each product must be carefully weighed by individual consumer Supplements may interfere with prescription drugs -Patient is satisfied with current therapy and denies issues -Recommended to continue current medication  Patient Goals/Self-Care Activities Patient will:  - take medications as prescribed check blood pressure weekly, document, and provide at future appointments target a minimum of 150 minutes of moderate intensity exercise weekly  Follow Up Plan: Telephone follow up appointment with care management team member scheduled for: 4 months      Medication Assistance: None required.  Patient affirms current coverage meets needs.  Compliance/Adherence/Medication fill history: Care Gaps: BP 142/62 ( 10/10/21)  Star-Rating Drugs: Benazepril (Losentin) 20 mg - Last filled 09/12/21 90 DS at [facility] Rosuvastatin (Crestor) 5 mg - Last filled 08/10/2021 90 DS at [facility]  Patient's preferred pharmacy is:  [facility]) - [place], [place] - [address] [facility] [address] [place] [address] [place] [address] Phone: [phone] Fax: [phone]  [facility] ([facility] ) - [place], [place] - [address] [place] [address] Phone: [phone] Fax: [phone]  [facility] [address] [place], [place] - [address] [place] [address] Phone: [phone] Fax: [phone]   Uses pill box? No - night medications are by her bed and morning ones are on top of her dresser  We discussed: Current pharmacy is preferred with insurance plan and patient is satisfied with pharmacy services Patient decided to: Continue current medication management strategy  Care Plan and Follow Up Patient Decision:  Patient agrees to Care Plan and Follow-up.  Plan: Telephone follow up appointment with care management team member scheduled for:  4 months  Gaylord Shih, PharmD, [facility] Clinical Pharmacist [facility] [place] [phone]

## 2021-10-15 NOTE — Patient Instructions (Addendum)
Hi Milly,  It was great to catch up with you again! Don't forget to look into aquatic therapy at [facility] ([place] location) to see if this is covered by your insurance and if the facility is not as slippery.  Please reach out to me if you have any questions or need anything!  Best, Maddie  [name], PharmD, [place] [facility] [place]   Visit Information   Goals Addressed   None    Patient Care Plan: CCM Pharmacy Care Plan     Problem Identified: Problem: Hypertension, Hyperlipidemia, GERD, Hypothyroidism, Anxiety, Osteoporosis, Osteoarthritis, and Insomnia      Long-Range Goal: Patient-Specific Goal   Start Date: 03/02/2021  Expected End Date: 03/02/2022  Recent Progress: On track  Priority: High  Note:   Current Barriers:  Unable to independently monitor therapeutic efficacy Unable to achieve control of cholesterol  Unable to maintain control of blood pressure  Pharmacist Clinical Goal(s):  Patient will achieve adherence to monitoring guidelines and medication adherence to achieve therapeutic efficacy achieve control of cholesterol as evidenced by next lipid panel maintain control of blood pressure as evidenced by home and office BP readings  through collaboration with PharmD and provider.   Interventions: 1:1 collaboration with Martinique, Betty G, MD regarding development and update of comprehensive plan of care as evidenced by provider attestation and co-signature Inter-disciplinary care team collaboration (see longitudinal plan of care) Comprehensive medication review performed; medication list updated in electronic medical record  Hypertension (BP goal <140/90) -Not ideally controlled -Current treatment: Benazepril 20 mg 1/2 tablet in the morning and 1 tablet at bedtime - Appropriate, Query effective, Safe, Accessible -Medications previously tried: none  -Current home readings: 147/72, 166/74,  135/97 (checking every other day - didn't check device at home)  -Current dietary habits: eating leaner meat and fruits and vegetables -Current exercise habits: walking every day -Denies hypotensive/hypertensive symptoms -Educated on BP goals and benefits of medications for prevention of heart attack, stroke and kidney damage; Exercise goal of 150 minutes per week; Importance of home blood pressure monitoring; Proper BP monitoring technique; -Counseled to monitor BP at home weekly, document, and provide log at future appointments -Counseled on diet and exercise extensively Recommended to continue current medication  Hyperlipidemia: (LDL goal < 70) -Not ideally controlled -Current treatment: Rosuvastatin 5 mg 1 tablet daily - Appropriate, Query effective, Safe, Accessible -Medications previously tried: none  -Current dietary patterns: not eating fried foods and uses olive oil when cooking; drinks 1 glass of wine 4 nights a week; eating a lot more simple foods -Current exercise habits: walking; going to a chiropractor -Educated on Cholesterol goals;  Benefits of statin for ASCVD risk reduction; Importance of limiting foods high in cholesterol; Exercise goal of 150 minutes per week; -Counseled on diet and exercise extensively  PAD (Goal: prevent heart events and improve circulation) -Controlled -Current treatment  No medications -Medications previously tried: aspirin (bruising) -Recommended to continue current medication Counseled on benefits of taking aspirin given PAD indication to prevent heart events.  Depression/Anxiety/Insomnia (Goal: minimize symptoms and improve sleep) -Not ideally controlled -Current treatment: Citalopram 20 mg 1 tablet daily - Appropriate, Effective, Safe, Accessible Lorazepam 1 mg 1 tablet at bedtime as needed - Appropriate, Effective, Query Safe, Accessible -Medications previously tried/failed: none -PHQ9: 8 -GAD7: 7 -Educated on Benefits of  medication for symptom control -Counseled on risk of falls with use of lorazepam Recommended discussion with PCP about alternative agents for sleep.   Osteopenia (Goal prevent  fractures) -Controlled -Last DEXA Scan: 11/17/20  T-Score femoral neck: -2.1  T-Score total hip: n/a  T-Score lumbar spine: 0.7  T-Score forearm radius: -1.5  10-year probability of major osteoporotic fracture: 16  10-year probability of hip fracture: 4.6% -Patient is not a candidate for pharmacologic treatment -Current treatment  Calcium-vit D-vit K 650-12.5 mcg 1 chew daily - Appropriate, Effective, Safe, Accessible Vitamin D 1000 units 2 capsules daily - Appropriate, Effective, Safe, Accessible -Medications previously tried: Alendronate (side effects) -Recommend [phone] units of vitamin D daily. Recommend 1200 mg of calcium daily from dietary and supplemental sources. Recommend weight-bearing and muscle strengthening exercises for building and maintaining bone density. -Counseled on diet and exercise extensively Recommended to continue current medication  Hypothyroidism (Goal: TSH 2.5-4.5 based on osteoporosis) -Controlled -Current treatment  Levothyroxine 88 mcg 1 tablet daily before breakfast - Appropriate, Effective, Safe, Accessible -Medications previously tried: none  -Recommended to continue current medication  GERD (Goal: minimize symptoms) -Controlled -Current treatment  Pantoprazole 40 mg 1 tablet three times a week - Appropriate, Effective, Safe, Accessible -Medications previously tried: none  -Counseled on duration of therapy with PPIs and long term risks of taking.   Health Maintenance -Vaccine gaps: tetanus -Current therapy:  Coenzyme Q10 50 mg 1 capsule daily Digestive enzymes Magnesium 100 mg 1 capsule daily Multivitamin 1 tablet daily Potassium gluconate 595 mg 1 tablet daily Selenium daily Milk thistle extract 175 mg 1 tablet daily Milk of magnesia as needed Biotin 1000 mcg 1  tablet daily Vitamin B complex daily Alpha lipoic acid 100 mg 1 tablet daily Olly skin - hyaluronic acid Circularity supplement daily -Educated on Herbal supplement research is limited and benefits usually cannot be proven Cost vs benefit of each product must be carefully weighed by individual consumer Supplements may interfere with prescription drugs -Patient is satisfied with current therapy and denies issues -Recommended to continue current medication  Patient Goals/Self-Care Activities Patient will:  - take medications as prescribed check blood pressure weekly, document, and provide at future appointments target a minimum of 150 minutes of moderate intensity exercise weekly  Follow Up Plan: Telephone follow up appointment with care management team member scheduled for: 4 months       Patient verbalizes understanding of instructions and care plan provided today and agrees to view in [place]. Active MyChart status confirmed with patient.   Telephone follow up appointment with pharmacy team member scheduled for: 4 months  Viona Gilmore, [facility]

## 2021-10-19 ENCOUNTER — Other Ambulatory Visit: Payer: Self-pay

## 2021-10-19 ENCOUNTER — Ambulatory Visit
Admission: RE | Admit: 2021-10-19 | Discharge: 2021-10-19 | Disposition: A | Discharge status: Home / Self Care | Payer: Medicare Other | Source: Ambulatory Visit | Attending: Family Medicine | Admitting: Family Medicine

## 2021-10-19 DIAGNOSIS — E049 Nontoxic goiter, unspecified: Secondary | ICD-10-CM | POA: Diagnosis not present

## 2021-10-19 DIAGNOSIS — E041 Nontoxic single thyroid nodule: Secondary | ICD-10-CM

## 2021-10-22 ENCOUNTER — Encounter: Payer: Self-pay | Admitting: Family Medicine

## 2021-10-24 DIAGNOSIS — H16223 Keratoconjunctivitis sicca, not specified as Sjogren's, bilateral: Secondary | ICD-10-CM | POA: Diagnosis not present

## 2021-11-04 ENCOUNTER — Other Ambulatory Visit: Payer: Self-pay | Admitting: Family Medicine

## 2021-11-04 DIAGNOSIS — F419 Anxiety disorder, unspecified: Secondary | ICD-10-CM

## 2021-11-05 NOTE — Telephone Encounter (Signed)
-   last filled 10/06/21 - last OV 10/10/21 - next OV 01/07/22

## 2021-11-06 DIAGNOSIS — I10 Essential (primary) hypertension: Secondary | ICD-10-CM

## 2021-11-06 DIAGNOSIS — E785 Hyperlipidemia, unspecified: Secondary | ICD-10-CM | POA: Diagnosis not present

## 2021-11-08 ENCOUNTER — Telehealth: Payer: Self-pay | Admitting: Pharmacist

## 2021-11-08 NOTE — Chronic Care Management (AMB) (Signed)
? ? ?Chronic Care Management ?Pharmacy Assistant  ? ?Name: Crystal Levy  MRN: [id number] DOB: 09-05-1941 ? ?Reason for Encounter: Disease State Hypertension Assessment ?  ?Conditions to be addressed/monitored: ?HTN ? ?Recent office visits:  ?None ? ?Recent consult visits:  ?None ? ?Hospital visits:  ?None in previous 6 months ? ?Medications: ?Outpatient Encounter Medications as of 11/08/2021  ?Medication Sig  ? Alpha-Lipoic Acid 100 MG TABS Take by mouth. (Patient not taking: Reported on 10/15/2021)  ? b complex vitamins tablet Take 1 tablet by mouth daily.  ? benazepril (LOTENSIN) 20 MG tablet 0.5 tab am and 1 tab pm  ? Calcium-Vitamin D-Vitamin K (VIACTIV CALCIUM PLUS D) 650-12.5-40 MG-MCG-MCG CHEW Chew 1 tablet by mouth daily.  ? Cholecalciferol (VITAMIN D-3) 1000 UNITS CAPS Take 2 capsules by mouth daily.  ? citalopram (CELEXA) 20 MG tablet TAKE 1 TABLET BY MOUTH  DAILY  ? co-enzyme Q-10 50 MG capsule Take 50 mg by mouth daily.  ? DIGESTIVE ENZYMES PO Take by mouth.  ? levothyroxine (SYNTHROID) 88 MCG tablet TAKE 1 TABLET BY MOUTH  DAILY BEFORE BREAKFAST  ? LORazepam (ATIVAN) 1 MG tablet TAKE 1 TABLET BY MOUTH AT BEDTIME AS NEEDED FOR ANXIETY  ? Magnesium 100 MG CAPS Take by mouth.  ? pantoprazole (PROTONIX) 40 MG tablet Take 40 mg by mouth 3 (three) times a week.  ? potassium gluconate 595 MG TABS tablet Take 595 mg by mouth daily.  ? Prenatal Vit-Fe Fumarate-FA (PRENATAL VITAMIN AND MINERAL PO) Take 1 tablet by mouth.  ? rosuvastatin (CRESTOR) 5 MG tablet TAKE 1 TABLET BY MOUTH  DAILY  ? Selenium (SELENIMIN PO) Take by mouth daily.  ? UNABLE TO FIND Olly Skin  ? ?No facility-administered encounter medications on file as of 11/08/2021.  ?Reviewed chart prior to disease state call. Spoke with patient regarding BP ? ?Recent Office Vitals: ?BP Readings from Last 3 Encounters:  ?10/10/21 (!) 146/62  ?06/15/21 120/76  ?05/28/21 132/70  ? ?Pulse Readings from Last 3 Encounters:  ?10/10/21 89  ?06/15/21 80  ?05/28/21 75   ?  ?Wt Readings from Last 3 Encounters:  ?10/10/21 143 lb 6 oz (65 kg)  ?06/15/21 144 lb 6 oz (65.5 kg)  ?05/28/21 143 lb 6 oz (65 kg)  ?  ? ?Kidney Function ?Lab Results  ?Component Value Date/Time  ? CREATININE 0.60 10/10/2021 12:44 PM  ? CREATININE 0.59 05/28/2021 09:56 AM  ? GFR 84.54 10/10/2021 12:44 PM  ? GFRNONAA >90 12/09/2014 09:29 AM  ? GFRAA >90 12/09/2014 09:29 AM  ? ? ?BMP Latest Ref Rng & Units 10/10/2021 05/28/2021 12/26/2020  ?Glucose 70 - 99 mg/dL 88 97 97  ?BUN 6 - 23 mg/dL 15 7 13   ?Creatinine 0.40 - 1.20 mg/dL 0.60 0.59 0.59  ?Sodium 135 - 145 mEq/L 138 141 139  ?Potassium 3.5 - 5.1 mEq/L 4.5 4.2 4.2  ?Chloride 96 - 112 mEq/L 101 103 101  ?CO2 19 - 32 mEq/L 33(H) 31 32  ?Calcium 8.4 - 10.5 mg/dL 9.6 9.0 8.8  ? ? ?Current antihypertensive regimen:  ?Benazepril 20 mg 1/2 tablet in the morning and 1 tablet at bedtime - Appropriate, Query effective, Safe, Accessible ?How often are you checking your Blood Pressure? twice daily ?Current home BP readings: 122/60 -120/70 ?139/75-130/70 ?122/77-120/63 ?Patient reports one day she felt fluttered and weird 146/75-140/71 ?128/59-130/62 ? She reports she also checked once when she had the sharp pain in her temple and she was surprised to find it on the lower  side in the 120's she reports she has noted twice some lightheadedness upon standing after sitting for a while and has since been mindful when standing. ?What recent interventions/DTPs have been made by any provider to improve Blood Pressure control since last CPP Visit: Patient reports increase in benazepril. ?Any recent hospitalizations or ED visits since last visit with CPP? None ?What diet changes have been made to improve Blood Pressure Control?  ?Patient reports she is still eating well veggies,fruits and good meats. ?What exercise is being done to improve your Blood Pressure Control?  ?Patient reports she crochets and sews and is currently working on some things for a spring fair. ? ?Adherence  Review: ?Is the patient currently on ACE/ARB medication? Yes ?Does the patient have >5 day gap between last estimated fill dates? No ? ? ?Care Gaps: ?BP- 130/62 ( home 11/07/21) ?CCM- 6/23 ?AWV- 6/22 ? ?Star Rating Drugs: ?Benazepril (Losentin) 20 mg - Last filled 10/24/21 90 DS at [facility] ?Rosuvastatin (Crestor) 5 mg - Last filled 10/11/2020 90 DS at [facility] ? ? ?Ned Clines CMA ?Clinical Pharmacist Assistant ?[phone] ? ?

## 2021-11-27 ENCOUNTER — Telehealth: Payer: Self-pay | Admitting: Pharmacist

## 2021-11-27 DIAGNOSIS — E039 Hypothyroidism, unspecified: Secondary | ICD-10-CM

## 2021-11-27 NOTE — Telephone Encounter (Signed)
Patient called me to see if it was possible to get her TSH and T3, T4 checked prior to her appointment in May. She feels like she is having a lot of symptoms of her thyroid being off (brittle nails, hair loss) and wanted to get things checked out sooner rather than later. Will route to PCP to see if this would be possible and will call patient back. ?

## 2021-11-27 NOTE — Telephone Encounter (Signed)
Called patient back and scheduled lab visit for tomorrow for TSH, T3 and T4 as patient was concerned with her levels.  ?

## 2021-11-27 NOTE — Telephone Encounter (Signed)
They are in.

## 2021-11-28 ENCOUNTER — Other Ambulatory Visit (INDEPENDENT_AMBULATORY_CARE_PROVIDER_SITE_OTHER): Payer: Medicare Other

## 2021-11-28 DIAGNOSIS — E039 Hypothyroidism, unspecified: Secondary | ICD-10-CM

## 2021-11-28 LAB — T4, FREE: Free T4: 1.45 ng/dL (ref 0.60–1.60)

## 2021-11-28 LAB — TSH: TSH: 0.22 u[IU]/mL — ABNORMAL LOW (ref 0.35–5.50)

## 2021-11-28 LAB — T3, FREE: T3, Free: 3 pg/mL (ref 2.3–4.2)

## 2021-11-30 ENCOUNTER — Other Ambulatory Visit: Payer: Self-pay

## 2021-11-30 ENCOUNTER — Telehealth: Payer: Self-pay | Admitting: Family Medicine

## 2021-11-30 NOTE — Telephone Encounter (Signed)
Patient stopped by because she received a phone call yesterday, but there was no voicemail. She explained the situation with the lab work and I let her know that the call was for her lab results from yesterday. Patient asked for her results and I printed them off and gave them to patient. ? ? ? ? ? ?FYI  ?

## 2021-11-30 NOTE — Telephone Encounter (Signed)
Patient notified of update  and verbalized understanding. 

## 2022-01-01 ENCOUNTER — Telehealth: Payer: Self-pay | Admitting: Family Medicine

## 2022-01-01 MED ORDER — LEVOTHYROXINE SODIUM 88 MCG PO TABS: 88.0000 ug | ORAL_TABLET | Freq: Every day | ORAL | 2 refills | Status: DC

## 2022-01-01 MED ORDER — LEVOTHYROXINE SODIUM 88 MCG PO TABS: 88.0000 ug | ORAL_TABLET | Freq: Every day | ORAL | 0 refills | Status: DC

## 2022-01-01 NOTE — Telephone Encounter (Signed)
Pt is calling an would like refill on levothyroxine (SYNTHROID) 88 MCG tablet Preferred Pharmacies    ? ?[facility] ([place], [place] Phone:  [phone]  ?Fax:  [phone]  ?  ?Also pt need 10 day supply sent to local phar until mail order arrives ?[place], [place] Phone:  [phone]  ?Fax:  [phone]  ?  ? ?

## 2022-01-01 NOTE — Telephone Encounter (Signed)
Rx's sent as requested. 

## 2022-01-04 NOTE — Progress Notes (Deleted)
HPI:  Crystal Levy is a 81 y.o. female, who is here today for 6 months follow up. She was last seen on ***  HLD and PAD:She is on Crestor 5 mg daily. Lab Results  Component Value Date   CHOL 151 05/28/2021   HDL 45.60 05/28/2021   LDLCALC 78 05/28/2021   LDLDIRECT 111.0 12/26/2020   TRIG 135.0 05/28/2021   CHOLHDL 3 05/28/2021    Hypothyroidism:She is on Levothyroxine 88 mcg daily. Lab Results  Component Value Date   TSH 0.22 (L) 11/28/2021    Review of Systems Rest see pertinent positives and negatives per HPI.  Current Outpatient Medications on File Prior to Visit  Medication Sig Dispense Refill   Alpha-Lipoic Acid 100 MG TABS Take by mouth. (Patient not taking: Reported on 10/15/2021)     benazepril (LOTENSIN) 20 MG tablet 0.5 tab am and 1 tab pm 135 tablet 1   Calcium-Vitamin D-Vitamin K (VIACTIV CALCIUM PLUS D) 650-12.5-40 MG-MCG-MCG CHEW Chew 1 tablet by mouth daily.     Cholecalciferol (VITAMIN D-3) 1000 UNITS CAPS Take 2 capsules by mouth daily.     citalopram (CELEXA) 20 MG tablet TAKE 1 TABLET BY MOUTH  DAILY 90 tablet 3   co-enzyme Q-10 50 MG capsule Take 50 mg by mouth daily.     DIGESTIVE ENZYMES PO Take by mouth.     levothyroxine (SYNTHROID) 88 MCG tablet Take 1 tablet (88 mcg total) by mouth daily before breakfast. 15 tablet 0   LORazepam (ATIVAN) 1 MG tablet TAKE 1 TABLET BY MOUTH AT BEDTIME AS NEEDED FOR ANXIETY 30 tablet 3   Magnesium 100 MG CAPS Take by mouth.     pantoprazole (PROTONIX) 40 MG tablet Take 40 mg by mouth 3 (three) times a week.     potassium gluconate 595 MG TABS tablet Take 595 mg by mouth daily.     Prenatal Vit-Fe Fumarate-FA (PRENATAL VITAMIN AND MINERAL PO) Take 1 tablet by mouth.     rosuvastatin (CRESTOR) 5 MG tablet TAKE 1 TABLET BY MOUTH  DAILY 90 tablet 3   Selenium (SELENIMIN PO) Take by mouth daily.     UNABLE TO FIND Olly Skin     No current facility-administered medications on file prior to visit.    Past  Medical History:  Diagnosis Date   Anxiety    Arthritis    Bicuspid aortic valve    Depression    Hypertension    Hypothyroidism    Polio    Thyroid disease    Allergies  Allergen Reactions   Clindamycin/Lincomycin Diarrhea and Other (See Comments)    C-Diff   Fluticasone     Headache and nosebleed    Azithromycin    Eggs Or Egg-Derived Products    Milk-Related Compounds     Unknown reaction   Penicillins Rash   Sulfa Antibiotics Diarrhea   Sulfamethoxazole-Trimethoprim    Fenofibrate Rash    Social History   Socioeconomic History   Marital status: Divorced    Spouse name: Not on file   Number of children: 2   Years of education: Not on file   Highest education level: Master's degree (e.g., MA, MS, MEng, MEd, MSW, MBA)  Occupational History    Employer: [employer]  Tobacco Use   Smoking status: Never   Smokeless tobacco: Never   Tobacco comments:    Rare smoker in the distant past.   Substance and Sexual Activity   Alcohol use: Yes  Alcohol/week: 0.0 standard drinks    Comment: wine   Drug use: No   Sexual activity: Not on file  Other Topics Concern   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    Social Determinants of Health   Financial Resource Strain: Medium Risk   Difficulty of Paying Living Expenses: Somewhat hard  Food Insecurity: No Food Insecurity   Worried About Charity fundraiser in the Last Year: Never true   Ran Out of Food in the Last Year: Never true  Transportation Needs: No Transportation Needs   Lack of Transportation (Medical): No   Lack of Transportation (Non-Medical): No  Physical Activity: Insufficiently Active   Days of Exercise per Week: 5 days   Minutes of Exercise per Session: 20 min  Stress: Stress Concern Present   Feeling of Stress : To some extent  Social Connections: Moderately Isolated   Frequency of Communication with Friends and Family: More than three  times a week   Frequency of Social Gatherings with Friends and Family: More than three times a week   Attends Religious Services: Never   Marine scientist or Organizations: Yes   Attends Music therapist: More than 4 times per year   Marital Status: Divorced    There were no vitals filed for this visit. There is no height or weight on file to calculate BMI.  Physical Exam  ASSESSMENT AND PLAN:   There are no diagnoses linked to this encounter.  No orders of the defined types were placed in this encounter.   No problem-specific Assessment & Plan notes found for this encounter.   No follow-ups on file.   Crystal G. Martinique, MD  [facility]. [place] office.

## 2022-01-07 ENCOUNTER — Other Ambulatory Visit: Payer: Medicare Other

## 2022-01-07 ENCOUNTER — Telehealth (INDEPENDENT_AMBULATORY_CARE_PROVIDER_SITE_OTHER): Payer: Medicare Other | Admitting: Family Medicine

## 2022-01-07 ENCOUNTER — Encounter: Payer: Self-pay | Admitting: Family Medicine

## 2022-01-07 ENCOUNTER — Other Ambulatory Visit (INDEPENDENT_AMBULATORY_CARE_PROVIDER_SITE_OTHER): Payer: Medicare Other

## 2022-01-07 ENCOUNTER — Ambulatory Visit: Payer: Medicare Other | Admitting: Family Medicine

## 2022-01-07 ENCOUNTER — Other Ambulatory Visit: Payer: Self-pay

## 2022-01-07 VITALS — BP 121/81 | HR 77 | Ht 65.0 in

## 2022-01-07 DIAGNOSIS — E785 Hyperlipidemia, unspecified: Secondary | ICD-10-CM

## 2022-01-07 DIAGNOSIS — E039 Hypothyroidism, unspecified: Secondary | ICD-10-CM | POA: Diagnosis not present

## 2022-01-07 DIAGNOSIS — F419 Anxiety disorder, unspecified: Secondary | ICD-10-CM

## 2022-01-07 DIAGNOSIS — I10 Essential (primary) hypertension: Secondary | ICD-10-CM

## 2022-01-07 LAB — LIPID PANEL
Cholesterol: 196 mg/dL (ref 0–200)
HDL: 58.5 mg/dL (ref >39.00)
LDL Cholesterol: 110 mg/dL — ABNORMAL HIGH (ref 0–99)
NonHDL: 137.62
Total CHOL/HDL Ratio: 3
Triglycerides: 140 mg/dL (ref 0.0–149.0)
VLDL: 28 mg/dL (ref 0.0–40.0)

## 2022-01-07 LAB — BASIC METABOLIC PANEL
BUN: 11 mg/dL (ref 6–23)
CO2: 29 mEq/L (ref 19–32)
Calcium: 8.7 mg/dL (ref 8.4–10.5)
Chloride: 102 mEq/L (ref 96–112)
Creatinine, Ser: 0.63 mg/dL (ref 0.40–1.20)
GFR: 83.41 mL/min (ref >60.00)
Glucose, Bld: 97 mg/dL (ref 70–99)
Potassium: 4.1 mEq/L (ref 3.5–5.1)
Sodium: 139 mEq/L (ref 135–145)

## 2022-01-07 LAB — TSH: TSH: 2.23 u[IU]/mL (ref 0.35–5.50)

## 2022-01-07 NOTE — Assessment & Plan Note (Signed)
BP adequately controlled. ?Continue benazepril 20 mg 1/2 tablet in the morning and 1 tablet at night. ?Continue monitoring BP regularly. ?Continue low-salt diet. ?

## 2022-01-07 NOTE — Assessment & Plan Note (Signed)
Problem is stable. ?Continue Celexa 20 mg daily and lorazepam 1 mg 1/2 to 1 tablet daily as needed. ?

## 2022-01-07 NOTE — Progress Notes (Signed)
Virtual Visit via Video Note ?I connected with Crystal Levy on 01/07/22 by a video enabled telemedicine application and verified that I am speaking with the correct person using two identifiers. ? Location patient: home ?Location provider:work office ?Persons participating in the virtual visit: patient, provider ? ?I discussed the limitations of evaluation and management by telemedicine and the availability of in person appointments. The patient expressed understanding and agreed to proceed. ? ?Chief Complaint  ?Patient presents with  ? Follow-up  ? ?HPI: ?[name] is a 81 yo female with hx of hypothyroidism,PAD,HLD,anxiety being seen today for follow up. ? ?She was last seen on 10/10/2021, no new problems since her last visit. ?No new problems since her last visit. ? ?Hypertension: Currently she is on benazepril 20 mg daily. ?Negative for severe/frequent headache, visual changes, chest pain, dyspnea, focal weakness, or worsening edema. ?Home BP readings usually < 140/90. ?Lab Results  ?Component Value Date  ? CREATININE 0.60 10/10/2021  ? BUN 15 10/10/2021  ? NA 138 10/10/2021  ? K 4.5 10/10/2021  ? CL 101 10/10/2021  ? CO2 33 (H) 10/10/2021  ? ?HLD and PAD: She is on Rosuvastatin 5 mg daily. ?Tolerating medication well. ? ?Lab Results  ?Component Value Date  ? CHOL 151 05/28/2021  ? HDL 45.60 05/28/2021  ? Crystal Levy 78 05/28/2021  ? LDLDIRECT 111.0 12/26/2020  ? TRIG 135.0 05/28/2021  ? CHOLHDL 3 05/28/2021  ? ?Hypothyroidism: Currently she is on levothyroxine 88 mcg daily. ?Negative for abnormal weight loss, changes in bowel habits, cold/heat intolerance, or tremor. ?Occasional palpitation, stable for a while, no associated symptoms. ? ?Lab Results  ?Component Value Date  ? TSH 0.22 (L) 11/28/2021  ? ?Anxiety: She is on Citalopram 20 mg daily and Lorazepam 1 mg 0.5-1 tab daily as needed. ?Tolerating medication well. ?Medications are still helping. ? ?ROS: See pertinent positives and negatives per HPI. ? ?Past  Medical History:  ?Diagnosis Date  ? Anxiety   ? Arthritis   ? Bicuspid aortic valve   ? Depression   ? Hypertension   ? Hypothyroidism   ? Polio   ? Thyroid disease   ? ?Past Surgical History:  ?Procedure Laterality Date  ? ABDOMINAL HYSTERECTOMY    ? BREAST ENHANCEMENT SURGERY    ? BREAST SURGERY    ? BUNIONECTOMY    ? CARDIAC CATHETERIZATION    ? EYE SURGERY    ? HAMMER TOE SURGERY    ? HERNIA REPAIR    ? JOINT REPLACEMENT    ? left hip  ? KNEE SURGERY    ? meniscus  ? RADICAL VAGINAL HYSTERECTOMY  1993  ? TONSILLECTOMY    ? TOTAL KNEE ARTHROPLASTY Left 12/20/2014  ? Procedure: LEFT TOTAL KNEE ARTHROPLASTY;  Surgeon: Melrose Nakayama, MD;  Location: [facility];  Service: Orthopedics;  Laterality: Left;  ? ? ?Family History  ?Problem Relation Age of Onset  ? CAD Mother 28  ? Aortic stenosis Mother   ? ? ?Social History  ? ?Socioeconomic History  ? Marital status: Divorced  ?  Spouse name: Not on file  ? Number of children: 2  ? Years of education: Not on file  ? Highest education level: Master's degree (e.g., MA, MS, MEng, MEd, MSW, MBA)  ?Occupational History  ?  Employer: [employer]  ?Tobacco Use  ? Smoking status: Never  ? Smokeless tobacco: Never  ? Tobacco comments:  ?  Rare smoker in the distant past.   ?Substance and Sexual Activity  ?  Alcohol use: Yes  ?  Alcohol/week: 0.0 standard drinks  ?  Comment: wine  ? Drug use: No  ? Sexual activity: Not on file  ?Other Topics Concern  ? Not on file  ?Social History Narrative  ? Lives alone. Retired psychiatric Education officer, museum.   ? ?Social Determinants of Health  ? ?Financial Resource Strain: Medium Risk  ? Difficulty of Paying Living Expenses: Somewhat hard  ?Food Insecurity: No Food Insecurity  ? Worried About Charity fundraiser in the Last Year: Never true  ? Ran Out of Food in the Last Year: Never true  ?Transportation Needs: No Transportation Needs  ? Lack of Transportation (Medical): No  ? Lack of Transportation (Non-Medical): No  ?Physical Activity:  Insufficiently Active  ? Days of Exercise per Week: 5 days  ? Minutes of Exercise per Session: 20 min  ?Stress: Stress Concern Present  ? Feeling of Stress : To some extent  ?Social Connections: Moderately Isolated  ? Frequency of Communication with Friends and Family: More than three times a week  ? Frequency of Social Gatherings with Friends and Family: More than three times a week  ? Attends Religious Services: Never  ? Active Member of Clubs or Organizations: Yes  ? Attends Archivist Meetings: More than 4 times per year  ? Marital Status: Divorced  ?Intimate Partner Violence: Unknown  ? Fear of Current or Ex-Partner: No  ? Emotionally Abused: No  ? Physically Abused: No  ? Sexually Abused: Not on file  ? ? ?Current Outpatient Medications:  ?  Alpha-Lipoic Acid 100 MG TABS, Take by mouth., Disp: , Rfl:  ?  benazepril (LOTENSIN) 20 MG tablet, 0.5 tab am and 1 tab pm, Disp: 135 tablet, Rfl: 1 ?  Calcium-Vitamin D-Vitamin K (VIACTIV CALCIUM PLUS D) 650-12.5-40 MG-MCG-MCG CHEW, Chew 1 tablet by mouth daily., Disp: , Rfl:  ?  Cholecalciferol (VITAMIN D-3) 1000 UNITS CAPS, Take 2 capsules by mouth daily., Disp: , Rfl:  ?  citalopram (CELEXA) 20 MG tablet, TAKE 1 TABLET BY MOUTH  DAILY, Disp: 90 tablet, Rfl: 3 ?  co-enzyme Q-10 50 MG capsule, Take 50 mg by mouth daily., Disp: , Rfl:  ?  DIGESTIVE ENZYMES PO, Take by mouth., Disp: , Rfl:  ?  levothyroxine (SYNTHROID) 88 MCG tablet, Take 1 tablet (88 mcg total) by mouth daily before breakfast., Disp: 15 tablet, Rfl: 0 ?  LORazepam (ATIVAN) 1 MG tablet, TAKE 1 TABLET BY MOUTH AT BEDTIME AS NEEDED FOR ANXIETY, Disp: 30 tablet, Rfl: 3 ?  Magnesium 100 MG CAPS, Take by mouth., Disp: , Rfl:  ?  potassium gluconate 595 MG TABS tablet, Take 595 mg by mouth daily., Disp: , Rfl:  ?  Prenatal Vit-Fe Fumarate-FA (PRENATAL VITAMIN AND MINERAL PO), Take 1 tablet by mouth., Disp: , Rfl:  ?  rosuvastatin (CRESTOR) 5 MG tablet, TAKE 1 TABLET BY MOUTH  DAILY, Disp: 90 tablet,  Rfl: 3 ?  Selenium (SELENIMIN PO), Take by mouth daily., Disp: , Rfl:  ?  UNABLE TO FIND, Olly Skin, Disp: , Rfl:  ? ?EXAM: ? ?VITALS per patient if applicable:BP 220/25   Pulse 77   Ht '5\' 5"'$  (1.651 m)   SpO2 99%   BMI 23.86 kg/m?  ? ?GENERAL: alert, oriented, appears well and in no acute distress ? ?HEENT: atraumatic, conjunctiva clear, no obvious abnormalities on inspection. ? ?NECK: normal movements of the head and neck ? ?LUNGS: on inspection no signs of respiratory distress, breathing rate appears  normal, no obvious gross SOB, gasping or wheezing ? ?CV: no obvious cyanosis ? ?MS: moves all visible extremities without noticeable abnormality ? ?PSYCH/NEURO: pleasant and cooperative, no obvious depression or anxiety, speech and thought processing grossly intact ? ?ASSESSMENT AND PLAN: ? ?Discussed the following assessment and plan: ? ? ?Lab Results  ?Component Value Date  ? TSH 2.23 01/07/2022  ? ?Lab Results  ?Component Value Date  ? CREATININE 0.63 01/07/2022  ? BUN 11 01/07/2022  ? NA 139 01/07/2022  ? K 4.1 01/07/2022  ? CL 102 01/07/2022  ? CO2 29 01/07/2022  ? ?Lab Results  ?Component Value Date  ? CHOL 196 01/07/2022  ? HDL 58.50 01/07/2022  ? LDLCALC 110 (H) 01/07/2022  ? LDLDIRECT 111.0 12/26/2020  ? TRIG 140.0 01/07/2022  ? CHOLHDL 3 01/07/2022  ? ?Hyperlipidemia ?Continue rosuvastatin 5 mg daily and low-fat diet. ?Lipid panel was done this morning, results still pending. ? ?Anxiety disorder, unspecified ?Problem is stable. ?Continue Celexa 20 mg daily and lorazepam 1 mg 1/2 to 1 tablet daily as needed. ? ?HTN (hypertension) ?BP adequately controlled. ?Continue benazepril 20 mg 1/2 tablet in the morning and 1 tablet at night. ?Continue monitoring BP regularly. ?Continue low-salt diet. ? ?Hypothyroidism (acquired) ?Last TSH abnormal. ?For now continue levothyroxine 88 mcg daily, dose will be adjusted according to TSH result. ? ?We discussed possible serious and likely etiologies, options for  evaluation and workup, limitations of telemedicine visit vs in person visit, treatment, treatment risks and precautions. ?The patient was advised to call back or seek an in-person evaluation if the symptoms worsen or if t

## 2022-01-07 NOTE — Assessment & Plan Note (Signed)
Continue rosuvastatin 5 mg daily and low-fat diet. ?Lipid panel was done this morning, results still pending. ?

## 2022-01-07 NOTE — Assessment & Plan Note (Signed)
Last TSH abnormal. ?For now continue levothyroxine 88 mcg daily, dose will be adjusted according to TSH result. ?

## 2022-02-15 ENCOUNTER — Ambulatory Visit (INDEPENDENT_AMBULATORY_CARE_PROVIDER_SITE_OTHER): Payer: Medicare Other

## 2022-02-15 VITALS — BP 120/62 | HR 65 | Temp 98.5°F | Ht 65.0 in | Wt 146.6 lb

## 2022-02-15 DIAGNOSIS — Z0000 Encounter for general adult medical examination without abnormal findings: Secondary | ICD-10-CM

## 2022-02-15 NOTE — Patient Instructions (Addendum)
Crystal Levy , Thank you for taking time to come for your Medicare Wellness Visit. I appreciate your ongoing commitment to your health goals. Please review the following plan we discussed and let me know if I can assist you in the future.   These are the goals we discussed:  Goals       Exercise 3x per week (30 min per time)      Lose weight (pt-stated)      I want walk better      Track and Manage My Blood Pressure-Hypertension      Timeframe:  Short-Term Goal Priority:  Medium Start Date:                             Expected End Date:                       Follow Up Date 06/02/21    - check blood pressure weekly - choose a place to take my blood pressure (home, clinic or office, retail store) - write blood pressure results in a log or diary    Why is this important?   You won't feel high blood pressure, but it can still hurt your blood vessels.  High blood pressure can cause heart or kidney problems. It can also cause a stroke.  Making lifestyle changes like losing a little weight or eating less salt will help.  Checking your blood pressure at home and at different times of the day can help to control blood pressure.  If the doctor prescribes medicine remember to take it the way the doctor ordered.  Call the office if you cannot afford the medicine or if there are questions about it.     Notes:         This is a list of the screening recommended for you and due dates:  Health Maintenance  Topic Date Due   Flu Shot  04/09/2022   Pneumonia Vaccine  Completed   DEXA scan (bone density measurement)  Completed   COVID-19 Vaccine  Completed   Zoster (Shingles) Vaccine  Completed   HPV Vaccine  Aged Out   Tetanus Vaccine  Discontinued     Advanced directives: Yes   Conditions/risks identified: None  Next appointment: Follow up in one year for your annual wellness visit     Preventive Care 65 Years and Older, Female Preventive care refers to lifestyle choices and  visits with your health care provider that can promote health and wellness. What does preventive care include? A yearly physical exam. This is also called an annual well check. Dental exams once or twice a year. Routine eye exams. Ask your health care provider how often you should have your eyes checked. Personal lifestyle choices, including: Daily care of your teeth and gums. Regular physical activity. Eating a healthy diet. Avoiding tobacco and drug use. Limiting alcohol use. Practicing safe sex. Taking low-dose aspirin every day. Taking vitamin and mineral supplements as recommended by your health care provider. What happens during an annual well check? The services and screenings done by your health care provider during your annual well check will depend on your age, overall health, lifestyle risk factors, and family history of disease. Counseling  Your health care provider may ask you questions about your: Alcohol use. Tobacco use. Drug use. Emotional well-being. Home and relationship well-being. Sexual activity. Eating habits. History of falls. Memory and ability to understand (  cognition). Work and work Statistician. Reproductive health. Screening  You may have the following tests or measurements: Height, weight, and BMI. Blood pressure. Lipid and cholesterol levels. These may be checked every 5 years, or more frequently if you are over 67 years old. Skin check. Lung cancer screening. You may have this screening every year starting at age 59 if you have a 30-pack-year history of smoking and currently smoke or have quit within the past 15 years. Fecal occult blood test (FOBT) of the stool. You may have this test every year starting at age 60. Flexible sigmoidoscopy or colonoscopy. You may have a sigmoidoscopy every 5 years or a colonoscopy every 10 years starting at age 46. Hepatitis C blood test. Hepatitis B blood test. Sexually transmitted disease (STD)  testing. Diabetes screening. This is done by checking your blood sugar (glucose) after you have not eaten for a while (fasting). You may have this done every 1-3 years. Bone density scan. This is done to screen for osteoporosis. You may have this done starting at age 5. Mammogram. This may be done every 1-2 years. Talk to your health care provider about how often you should have regular mammograms. Talk with your health care provider about your test results, treatment options, and if necessary, the need for more tests. Vaccines  Your health care provider may recommend certain vaccines, such as: Influenza vaccine. This is recommended every year. Tetanus, diphtheria, and acellular pertussis (Tdap, Td) vaccine. You may need a Td booster every 10 years. Zoster vaccine. You may need this after age 100. Pneumococcal 13-valent conjugate (PCV13) vaccine. One dose is recommended after age 24. Pneumococcal polysaccharide (PPSV23) vaccine. One dose is recommended after age 63. Talk to your health care provider about which screenings and vaccines you need and how often you need them. This information is not intended to replace advice given to you by your health care provider. Make sure you discuss any questions you have with your health care provider. Document Released: 09/22/2015 Document Revised: 05/15/2016 Document Reviewed: 06/27/2015 Elsevier Interactive Patient Education  2017 [place] Prevention in the Home Falls can cause injuries. They can happen to people of all ages. There are many things you can do to make your home safe and to help prevent falls. What can I do on the outside of my home? Regularly fix the edges of walkways and driveways and fix any cracks. Remove anything that might make you trip as you walk through a door, such as a raised step or threshold. Trim any bushes or trees on the path to your home. Use bright outdoor lighting. Clear any walking paths of anything that  might make someone trip, such as rocks or tools. Regularly check to see if handrails are loose or broken. Make sure that both sides of any steps have handrails. Any raised decks and porches should have guardrails on the edges. Have any leaves, snow, or ice cleared regularly. Use sand or salt on walking paths during winter. Clean up any spills in your garage right away. This includes oil or grease spills. What can I do in the bathroom? Use night lights. Install grab bars by the toilet and in the tub and shower. Do not use towel bars as grab bars. Use non-skid mats or decals in the tub or shower. If you need to sit down in the shower, use a plastic, non-slip stool. Keep the floor dry. Clean up any water that spills on the floor as soon as it happens.  Remove soap buildup in the tub or shower regularly. Attach bath mats securely with double-sided non-slip rug tape. Do not have throw rugs and other things on the floor that can make you trip. What can I do in the bedroom? Use night lights. Make sure that you have a light by your bed that is easy to reach. Do not use any sheets or blankets that are too big for your bed. They should not hang down onto the floor. Have a firm chair that has side arms. You can use this for support while you get dressed. Do not have throw rugs and other things on the floor that can make you trip. What can I do in the kitchen? Clean up any spills right away. Avoid walking on wet floors. Keep items that you use a lot in easy-to-reach places. If you need to reach something above you, use a strong step stool that has a grab bar. Keep electrical cords out of the way. Do not use floor polish or wax that makes floors slippery. If you must use wax, use non-skid floor wax. Do not have throw rugs and other things on the floor that can make you trip. What can I do with my stairs? Do not leave any items on the stairs. Make sure that there are handrails on both sides of the  stairs and use them. Fix handrails that are broken or loose. Make sure that handrails are as long as the stairways. Check any carpeting to make sure that it is firmly attached to the stairs. Fix any carpet that is loose or worn. Avoid having throw rugs at the top or bottom of the stairs. If you do have throw rugs, attach them to the floor with carpet tape. Make sure that you have a light switch at the top of the stairs and the bottom of the stairs. If you do not have them, ask someone to add them for you. What else can I do to help prevent falls? Wear shoes that: Do not have high heels. Have rubber bottoms. Are comfortable and fit you well. Are closed at the toe. Do not wear sandals. If you use a stepladder: Make sure that it is fully opened. Do not climb a closed stepladder. Make sure that both sides of the stepladder are locked into place. Ask someone to hold it for you, if possible. Clearly mark and make sure that you can see: Any grab bars or handrails. First and last steps. Where the edge of each step is. Use tools that help you move around (mobility aids) if they are needed. These include: Canes. Walkers. Scooters. Crutches. Turn on the lights when you go into a dark area. Replace any light bulbs as soon as they burn out. Set up your furniture so you have a clear path. Avoid moving your furniture around. If any of your floors are uneven, fix them. If there are any pets around you, be aware of where they are. Review your medicines with your doctor. Some medicines can make you feel dizzy. This can increase your chance of falling. Ask your doctor what other things that you can do to help prevent falls. This information is not intended to replace advice given to you by your health care provider. Make sure you discuss any questions you have with your health care provider. Document Released: 06/22/2009 Document Revised: 02/01/2016 Document Reviewed: 09/30/2014 Elsevier Interactive  Patient Education  2017 [employer].

## 2022-02-15 NOTE — Progress Notes (Signed)
Subjective:   Crystal Levy is a 81 y.o. female who presents for Medicare Annual (Subsequent) preventive examination.  Review of Systems     Cardiac Risk Factors include: advanced age (>29mn, >>29women);hypertension     Objective:    Today's Vitals   02/15/22 1035  BP: 120/62  Pulse: 65  Temp: 98.5 F (36.9 C)  TempSrc: Oral  SpO2: 98%  Weight: 146 lb 9.6 oz (66.5 kg)  Height: '5\' 5"'$  (1.651 m)   Body mass index is 24.4 kg/m.     02/15/2022   10:55 AM 02/14/2021   11:27 AM 12/21/2014    2:39 PM 12/09/2014    9:07 AM  Advanced Directives  Does Patient Have a Medical Advance Directive? Yes Yes Yes Yes  Type of [place]amed[place]of [place] will [place] will [place] will   Does patient want to make changes to medical advance directive? No - Patient declined  No - Patient declined   Copy of HSweet Homein Chart? Yes - validated most recent copy scanned in chart (See row information) No - copy requested No - copy requested No - copy requested    Current Medications (verified) Outpatient Encounter Medications as of 02/15/2022  Medication Sig   Alpha-Lipoic Acid 100 MG TABS Take by mouth.   benazepril (LOTENSIN) 20 MG tablet 0.5 tab am and 1 tab pm   Calcium-Vitamin D-Vitamin K (VIACTIV CALCIUM PLUS D) 650-12.5-40 MG-MCG-MCG CHEW Chew 1 tablet by mouth daily.   Cholecalciferol (VITAMIN D-3) 1000 UNITS CAPS Take 2 capsules by mouth daily.   citalopram (CELEXA) 20 MG tablet TAKE 1 TABLET BY MOUTH  DAILY   co-enzyme Q-10 50 MG capsule Take 50 mg by mouth daily.   DIGESTIVE ENZYMES PO Take by mouth.   levothyroxine (SYNTHROID) 88 MCG tablet Take 1 tablet (88 mcg total) by mouth daily before breakfast.   LORazepam (ATIVAN) 1 MG tablet TAKE 1 TABLET BY MOUTH AT BEDTIME AS NEEDED FOR ANXIETY   Magnesium 100 MG CAPS Take by mouth.   potassium gluconate 595 MG TABS tablet Take 595 mg by  mouth daily.   Prenatal Vit-Fe Fumarate-FA (PRENATAL VITAMIN AND MINERAL PO) Take 1 tablet by mouth.   rosuvastatin (CRESTOR) 5 MG tablet TAKE 1 TABLET BY MOUTH  DAILY   Selenium (SELENIMIN PO) Take by mouth daily.   UNABLE TO FIND Olly Skin   No facility-administered encounter medications on file as of 02/15/2022.    Allergies (verified) Clindamycin/lincomycin, Fluticasone, Azithromycin, Eggs or egg-derived products, Milk-related compounds, Penicillins, Sulfa antibiotics, Sulfamethoxazole-trimethoprim, and Fenofibrate   History: Past Medical History:  Diagnosis Date   Anxiety    Arthritis    Bicuspid aortic valve    Depression    Hypertension    Hypothyroidism    Polio    Thyroid disease    Past Surgical History:  Procedure Laterality Date   ABDOMINAL HYSTERECTOMY     BREAST ENHANCEMENT SURGERY     BREAST SURGERY     BUNIONECTOMY     CARDIAC CATHETERIZATION     EYE SURGERY     HAMMER TOE SURGERY     HERNIA REPAIR     JOINT REPLACEMENT     left hip   KNEE SURGERY     meniscus   RADICAL VAGINAL HYSTERECTOMY  1993   TONSILLECTOMY     TOTAL KNEE ARTHROPLASTY Left 12/20/2014   Procedure: LEFT TOTAL KNEE ARTHROPLASTY;  Surgeon: PMelrose Nakayama  MD;  Location: [facility];  Service: Orthopedics;  Laterality: Left;   Family History  Problem Relation Age of Onset   CAD Mother 33   Aortic stenosis Mother    Social History   Socioeconomic History   Marital status: Divorced    Spouse name: Not on file   Number of children: 2   Years of education: Not on file   Highest education level: Master's degree (e.g., MA, MS, MEng, MEd, MSW, MBA)  Occupational History    Employer: [employer]  Tobacco Use   Smoking status: Never   Smokeless tobacco: Never   Tobacco comments:    Rare smoker in the distant past.   Substance and Sexual Activity   Alcohol use: Yes    Alcohol/week: 0.0 standard drinks of alcohol    Comment: wine   Drug use: No   Sexual activity: Not on file   Other Topics Concern   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    Social Determinants of Health   Financial Resource Strain: Low Risk  (02/15/2022)   Overall Financial Resource Strain (CARDIA)    Difficulty of Paying Living Expenses: Not hard at all  Recent Concern: Financial Resource Strain - Medium Risk (01/03/2022)   Overall Financial Resource Strain (CARDIA)    Difficulty of Paying Living Expenses: Somewhat hard  Food Insecurity: No Food Insecurity (02/15/2022)   Hunger Vital Sign    Worried About Running Out of Food in the Last Year: Never true    [place] in the Last Year: Never true  Transportation Needs: No Transportation Needs (02/15/2022)   PRAPARE - Hydrologist (Medical): No    Lack of Transportation (Non-Medical): No  Physical Activity: Sufficiently Active (02/15/2022)   Exercise Vital Sign    Days of Exercise per Week: 7 days    Minutes of Exercise per Session: 30 min  Recent Concern: Physical Activity - Insufficiently Active (01/03/2022)   Exercise Vital Sign    Days of Exercise per Week: 5 days    Minutes of Exercise per Session: 20 min  Stress: No Stress Concern Present (02/15/2022)   [place]    Feeling of Stress : Not at all  Recent Concern: Stress - Stress Concern Present (01/03/2022)   [place]    Feeling of Stress : To some extent  Social Connections: Socially Isolated (02/15/2022)   Social Connection and Isolation Panel [NHANES]    Frequency of Communication with Friends and Family: More than three times a week    Frequency of Social Gatherings with Friends and Family: More than three times a week    Attends Religious Services: Never    Marine scientist or Organizations: No    Attends Archivist Meetings: Never    Marital Status:  Divorced    Tobacco Counseling Counseling given: Not Answered Tobacco comments: Rare smoker in the distant past.    Clinical Intake:   Diabetic?  No  Interpreter Needed?: No Activities of Daily Living    02/15/2022   10:52 AM  In your present state of health, do you have any difficulty performing the following activities:  Hearing? 0  Vision? 0  Difficulty concentrating or making decisions? 0  Walking or climbing stairs? 0  Dressing or bathing? 0  Doing errands, shopping? 0  Preparing Food  and eating ? N  Using the Toilet? N  In the past six months, have you accidently leaked urine? Y  Comment Wears Pads Followed by PCP  Do you have problems with loss of bowel control? N  Managing your Medications? N  Managing your Finances? N  Housekeeping or managing your Housekeeping? N    Patient Care Team: [place], Betty G, MD as PCP - General (Family Medicine) Viona Gilmore, [facility] as Pharmacist (Pharmacist)  Indicate any recent Medical Services you may have received from other than [facility] providers in the past year (date may be approximate).     Assessment:   This is a routine wellness examination for [employer].  Hearing/Vision screen Vision Screening - Comments:: Wears glasses. Followed by [place] issues and exercise activities discussed: Exercise limited by: None identified   Goals Addressed               This Visit's Progress     Lose weight (pt-stated)        I want walk better       Depression Screen    02/15/2022   10:49 AM 10/10/2021   12:41 PM 02/14/2021   11:34 AM 02/14/2021   11:27 AM 02/06/2020   11:36 AM  PHQ 2/9 Scores  PHQ - 2 Score 0 0 0 0 1  PHQ- 9 Score  5   8    Fall Risk    02/15/2022   10:53 AM 01/03/2022    4:43 PM 10/10/2021   12:41 PM 02/14/2021   11:33 AM 08/30/2019   12:22 PM  Fall Risk   Falls in the past year? 0 '1 1 1 1  '$ Number falls in past yr: 0 '1 1 1 1  '$ Injury with Fall? 0 '1 1 1 '$ 0  Risk for fall due to : No  Fall Risks   Impaired balance/gait;Orthopedic patient Impaired balance/gait;History of fall(s);Orthopedic patient  Follow up    Falls evaluation completed Education provided    FALL RISK PREVENTION PERTAINING TO THE HOME:  Any stairs in or around the home? No  If so, are there any without handrails? No  Home free of loose throw rugs in walkways, pet beds, electrical cords, etc? Yes  Adequate lighting in your home to reduce risk of falls? Yes   ASSISTIVE DEVICES UTILIZED TO PREVENT FALLS:  Life alert? No  Use of a cane, walker or w/c? Yes  Grab bars in the bathroom? Yes  Shower chair or bench in shower? Yes  Elevated toilet seat or a handicapped toilet? Yes   TIMED UP AND GO:  Was the test performed? Yes .  Length of time to ambulate 10 feet: 5 sec.   Gait steady and fast without use of assistive device  Cognitive Function:        02/15/2022   10:55 AM  6CIT Screen  What Year? 0 points  What month? 0 points  What time? 0 points  Count back from 20 0 points  Months in reverse 0 points  Repeat phrase 0 points  Total Score 0 points    Immunizations Immunization History  Administered Date(s) Administered   Fluad Quad(high Dose 65+) 05/28/2021   Influenza Split 09/07/2018, 04/28/2019   Influenza, High Dose Seasonal PF 08/31/2018, 06/05/2020   PFIZER(Purple Top)SARS-COV-2 Vaccination 09/10/2019, 10/21/2019, 06/19/2020, 12/08/2020   Pfizer Covid-19 Vaccine Bivalent Booster 8yr & up 06/22/2021   Pneumococcal Conjugate-13 01/27/2014   Pneumococcal Polysaccharide-23 09/10/2007   Pneumococcal-Unspecified 09/09/2008  Tdap 09/09/2009, 01/16/2010   Zoster Recombinat (Shingrix) 12/26/2016, 04/12/2017   Zoster, Live 09/09/2009, 03/15/2013      Flu Vaccine status: Up to date  Pneumococcal vaccine status: Up to date  Covid-19 vaccine status: Completed vaccines  Qualifies for Shingles Vaccine? Yes   Zostavax completed Yes   Shingrix Completed?: Yes  Screening  Tests Health Maintenance  Topic Date Due   INFLUENZA VACCINE  04/09/2022   Pneumonia Vaccine 26+ Years old  Completed   DEXA SCAN  Completed   COVID-19 Vaccine  Completed   Zoster Vaccines- Shingrix  Completed   HPV VACCINES  Aged Out   TETANUS/TDAP  Discontinued    Health Maintenance  There are no preventive care reminders to display for this patient.  Colorectal cancer screening: No longer required.   Mammogram status: No longer required due to Age.  Bone Density status: Completed 11/10/20. Results reflect: Bone density results: OSTEOPENIA. Repeat every   years.  Lung Cancer Screening: (Low Dose CT Chest recommended if Age 42-80 years, 30 pack-year currently smoking OR have quit w/in 15years.) does not qualify.     Additional Screening:  Hepatitis C Screening: does not qualify; Completed   Vision Screening: Recommended annual ophthalmology exams for early detection of glaucoma and other disorders of the eye. Is the patient up to date with their annual eye exam?  Yes  Who is the provider or what is the name of the office in which the patient attends annual eye exams? [facility] If pt is not established with a provider, would they like to be referred to a provider to establish care? No .   Dental Screening: Recommended annual dental exams for proper oral hygiene  Community Resource Referral / Chronic Care Management:  CRR required this visit?  No   CCM required this visit?  No      Plan:     I have personally reviewed and noted the following in the patient's chart:   Medical and social history Use of alcohol, tobacco or illicit drugs  Current medications and supplements including opioid prescriptions.  Functional ability and status Nutritional status Physical activity Advanced directives List of other physicians Hospitalizations, surgeries, and ER visits in previous 12 months Vitals Screenings to include cognitive, depression, and falls Referrals  and appointments  In addition, I have reviewed and discussed with patient certain preventive protocols, quality metrics, and best practice recommendations. A written personalized care plan for preventive services as well as general preventive health recommendations were provided to patient.     BRALYN ESPINO, LPN   [id number]   Nurse Notes: None

## 2022-02-21 DIAGNOSIS — H20012 Primary iridocyclitis, left eye: Secondary | ICD-10-CM | POA: Diagnosis not present

## 2022-02-26 DIAGNOSIS — H20012 Primary iridocyclitis, left eye: Secondary | ICD-10-CM | POA: Diagnosis not present

## 2022-03-04 ENCOUNTER — Telehealth: Payer: Self-pay | Admitting: Pharmacist

## 2022-03-04 ENCOUNTER — Telehealth: Payer: Self-pay

## 2022-03-04 DIAGNOSIS — E785 Hyperlipidemia, unspecified: Secondary | ICD-10-CM

## 2022-03-04 DIAGNOSIS — I10 Essential (primary) hypertension: Secondary | ICD-10-CM

## 2022-03-05 ENCOUNTER — Ambulatory Visit (INDEPENDENT_AMBULATORY_CARE_PROVIDER_SITE_OTHER): Payer: Medicare Other | Admitting: Pharmacist

## 2022-03-05 ENCOUNTER — Telehealth: Payer: Self-pay

## 2022-03-05 DIAGNOSIS — E785 Hyperlipidemia, unspecified: Secondary | ICD-10-CM

## 2022-03-05 DIAGNOSIS — I10 Essential (primary) hypertension: Secondary | ICD-10-CM

## 2022-03-05 DIAGNOSIS — H20012 Primary iridocyclitis, left eye: Secondary | ICD-10-CM | POA: Diagnosis not present

## 2022-03-05 MED ORDER — ROSUVASTATIN CALCIUM 10 MG PO TABS: 10.0000 mg | ORAL_TABLET | Freq: Every day | ORAL | 3 refills | Status: DC

## 2022-03-05 NOTE — Progress Notes (Signed)
Chronic Care Management Pharmacy Note  03/05/2022 Name:  Crystal Levy MRN:  [id number] DOB:  [birth year]  Summary: LDL not at goal < 70 Pt was unaware of the recommended dose change with rosuvastatin Pt has been trying to taper off lorazepam with no success  Recommendations/Changes made from today's visit: -Recommended increasing to rosuvastatin 10 mg daily per PCP notes -Recommended trial of 1/2 tablet of lorazepam every third night for several weeks and slow increments of taper rather than 50% dose change   Plan: HLD/BP assessment in 3 months Follow up in 6 months  Subjective: Crystal Levy is an 81 y.o. year old female who is a primary patient of [place], Timoteo Expose, MD.  The CCM team was consulted for assistance with disease management and care coordination needs.    Engaged with patient by telephone for follow up visit in response to provider referral for pharmacy case management and/or care coordination services.   Consent to Services:  The patient was given information about Chronic Care Management services, agreed to services, and gave verbal consent prior to initiation of services.  Please see initial visit note for detailed documentation.   Patient Care Team: [place], Betty G, MD as PCP - General (Family Medicine) Verner Chol, [facility] as Pharmacist (Pharmacist)  Recent office visits: 02/15/22 Theresa Mulligan, LPN: Patient presented for AWV.  01/15/22 Betty [place], MD: Patient presented for video visit for chronic conditions follow up. Recommended increasing rosuvastatin to 10 mg daily.  10/10/21 Betty [place], MD: Patient presented for HTN follow up. Increased benazepril to 1/2 tablet in the morning and 20 mg at night.   Recent consult visits: 08/17/21 Erskine Emery - Patient presented for Swallowing test.   07/30/21 Valetta Fuller) - Patient presented for Constipation. No medication changes. Swallow Eval ordered.  Hospital visits: None in previous 6  months   Objective:  Lab Results  Component Value Date   CREATININE 0.63 01/07/2022   BUN 11 01/07/2022   GFR 83.41 01/07/2022   GFRNONAA >90 12/09/2014   GFRAA >90 12/09/2014   NA 139 01/07/2022   K 4.1 01/07/2022   CALCIUM 8.7 01/07/2022   CO2 29 01/07/2022   GLUCOSE 97 01/07/2022    Lab Results  Component Value Date/Time   GFR 83.41 01/07/2022 08:15 AM   GFR 84.54 10/10/2021 12:44 PM    Last diabetic Eye exam: No results found for: "HMDIABEYEEXA"  Last diabetic Foot exam: No results found for: "HMDIABFOOTEX"   Lab Results  Component Value Date   CHOL 196 01/07/2022   HDL 58.50 01/07/2022   LDLCALC 110 (H) 01/07/2022   LDLDIRECT 111.0 12/26/2020   TRIG 140.0 01/07/2022   CHOLHDL 3 01/07/2022       Latest Ref Rng & Units 12/26/2020    8:02 AM  Hepatic Function  Total Protein 6.0 - 8.3 g/dL 6.1   Albumin 3.5 - 5.2 g/dL 3.9   AST 0 - 37 U/L 16   ALT 0 - 35 U/L 12   Alk Phosphatase 39 - 117 U/L 103   Total Bilirubin 0.2 - 1.2 mg/dL 0.6     Lab Results  Component Value Date/Time   TSH 2.23 01/07/2022 08:15 AM   TSH 0.22 (L) 11/28/2021 08:32 AM   FREET4 1.45 11/28/2021 08:32 AM       Latest Ref Rng & Units 12/09/2014    9:29 AM 02/19/2012    7:26 PM  CBC  WBC 4.0 - 10.5 K/uL 6.4  8.5   Hemoglobin 12.0 - 15.0 g/dL 13.9  14.3   Hematocrit 36.0 - 46.0 % 41.9  42.5   Platelets 150 - 400 K/uL 298  300     No results found for: "VD25OH"  Clinical ASCVD: No  The ASCVD Risk score (Arnett DK, et al., 2019) failed to calculate for the following reasons:   The 2019 ASCVD risk score is only valid for ages 50 to 4       02/15/2022   10:49 AM 10/10/2021   12:41 PM 02/14/2021   11:34 AM  Depression screen PHQ 2/9  Decreased Interest 0 0 0  Down, Depressed, Hopeless 0 0 0  PHQ - 2 Score 0 0 0  Altered sleeping  1   Tired, decreased energy  1   Change in appetite  0   Feeling bad or failure about yourself   0   Trouble concentrating  0   Moving slowly or  fidgety/restless  3   Suicidal thoughts  0   PHQ-9 Score  5   Difficult doing work/chores  Somewhat difficult       Social History   Tobacco Use  Smoking Status Never  Smokeless Tobacco Never  Tobacco Comments   Rare smoker in the distant past.    BP Readings from Last 3 Encounters:  02/15/22 120/62  01/07/22 121/81  10/10/21 (!) 146/62   Pulse Readings from Last 3 Encounters:  02/15/22 65  01/07/22 77  10/10/21 89   Wt Readings from Last 3 Encounters:  02/15/22 146 lb 9.6 oz (66.5 kg)  10/10/21 143 lb 6 oz (65 kg)  06/15/21 144 lb 6 oz (65.5 kg)   BMI Readings from Last 3 Encounters:  02/15/22 24.40 kg/m  01/07/22 23.86 kg/m  10/10/21 23.86 kg/m    Assessment/Interventions: Review of patient past medical history, allergies, medications, health status, including review of consultants reports, laboratory and other test data, was performed as part of comprehensive evaluation and provision of chronic care management services.   SDOH:  (Social Determinants of Health) assessments and interventions performed: Yes SDOH Interventions    Flowsheet Row Most Recent Value  SDOH Interventions   Financial Strain Interventions Intervention Not Indicated       SDOH Screenings   Alcohol Screen: Low Risk  (02/15/2022)   Alcohol Screen    Last Alcohol Screening Score (AUDIT): 3  Depression (PHQ2-9): Low Risk  (02/15/2022)   Depression (PHQ2-9)    PHQ-2 Score: 0  Financial Resource Strain: Low Risk  (03/05/2022)   Overall Financial Resource Strain (CARDIA)    Difficulty of Paying Living Expenses: Not hard at all  Recent Concern: Financial Resource Strain - Medium Risk (01/03/2022)   Overall Financial Resource Strain (CARDIA)    Difficulty of Paying Living Expenses: Somewhat hard  Food Insecurity: No Food Insecurity (02/15/2022)   Hunger Vital Sign    Worried About Running Out of Food in the Last Year: Never true    Ran Out of Food in the Last Year: Never true  Housing: Low  Risk  (02/15/2022)   Housing    Last Housing Risk Score: 0  Physical Activity: Sufficiently Active (02/15/2022)   Exercise Vital Sign    Days of Exercise per Week: 7 days    Minutes of Exercise per Session: 30 min  Recent Concern: Physical Activity - Insufficiently Active (01/03/2022)   Exercise Vital Sign    Days of Exercise per Week: 5 days    Minutes of Exercise per Session:  20 min  Social Connections: Socially Isolated (02/15/2022)   Social Connection and Isolation Panel [NHANES]    Frequency of Communication with Friends and Family: More than three times a week    Frequency of Social Gatherings with Friends and Family: More than three times a week    Attends Religious Services: Never    Marine scientist or Organizations: No    Attends Archivist Meetings: Never    Marital Status: Divorced  Stress: No Stress Concern Present (02/15/2022)   [employer]    Feeling of Stress : Not at all  Recent Concern: Stress - Stress Concern Present (01/03/2022)   [employer]    Feeling of Stress : To some extent  Tobacco Use: Low Risk  (02/15/2022)   Patient History    Smoking Tobacco Use: Never    Smokeless Tobacco Use: Never    Passive Exposure: Not on file  Transportation Needs: No Transportation Needs (02/15/2022)   PRAPARE - Transportation    Lack of Transportation (Medical): No    Lack of Transportation (Non-Medical): No     CCM Care Plan  Allergies  Allergen Reactions   Clindamycin/Lincomycin Diarrhea and Other (See Comments)    C-Diff   Fluticasone     Headache and nosebleed    Azithromycin    Eggs Or Egg-Derived Products    Milk-Related Compounds     Unknown reaction   Penicillins Rash   Sulfa Antibiotics Diarrhea   Sulfamethoxazole-Trimethoprim    Fenofibrate Rash    Medications Reviewed Today     Reviewed by Viona Gilmore, [facility]  (Pharmacist) on 03/05/22 at [place] List Status: <None>   Medication Order Taking? Sig Documenting Provider Last Dose Status Informant  Alpha-Lipoic Acid 100 MG TABS [id number] Yes Take by mouth. [provider] Taking Active   benazepril (LOTENSIN) 20 MG tablet [id number]  0.5 tab am and 1 tab pm [place], Betty G, MD  Active   Calcium-Vitamin D-Vitamin K Hinton Rao CALCIUM PLUS D) 650-12.5-40 MG-MCG-MCG CHEW [id number] Yes Chew 1 tablet by mouth daily. [provider] Taking Active   Cholecalciferol (VITAMIN D-3) 1000 UNITS CAPS [id number] Yes Take 2 capsules by mouth daily. [provider] Taking Active   citalopram (CELEXA) 20 MG tablet [id number]  TAKE 1 TABLET BY MOUTH  DAILY [place], Betty G, MD  Active   co-enzyme Q-10 50 MG capsule [id number] Yes Take 50 mg by mouth daily. [provider] Taking Active   DIGESTIVE ENZYMES PO [id number] Yes Take by mouth. [provider] Taking Active   Iodine, Kelp, (KELP PO) [id number] Yes Take 1 tablet by mouth daily as needed. [provider] Taking Active   levothyroxine (SYNTHROID) 88 MCG tablet [id number]  Take 1 tablet (88 mcg total) by mouth daily before breakfast. [place], Betty G, MD  Active   LORazepam (ATIVAN) 1 MG tablet [id number]  TAKE 1 TABLET BY MOUTH AT BEDTIME AS NEEDED FOR ANXIETY [place], Betty G, MD  Active   Magnesium 100 MG CAPS [id number] Yes Take by mouth. [provider] Taking Active   Prenatal Vit-Fe Fumarate-FA (PRENATAL VITAMIN AND MINERAL PO) [id number] Yes Take 1 tablet by mouth. [provider] Taking Active   rosuvastatin (CRESTOR) 5 MG tablet [id number]  TAKE 1 TABLET BY MOUTH  DAILY [place], Betty G, MD  Active   Selenium (SELENIMIN PO) [id number] Yes Take by mouth daily. [provider]  Taking Active   UNABLE TO FIND [id number] Yes Olly Skin [provider] Taking Active             Patient Active Problem List   Diagnosis Date Noted   Thyroid  nodule 10/10/2021   DNR (do not resuscitate) 10/10/2021   Osteopenia 10/10/2021   Generalized osteoarthritis of multiple sites 04/11/2020   Insomnia 03/21/2020   GERD (gastroesophageal reflux disease) 02/01/2020   Anxiety disorder, unspecified 01/31/2020   PAD (peripheral artery disease) ([place]) 01/31/2020   Hyperlipidemia 08/30/2019   Hypothyroidism (acquired) 03/01/2019   Primary osteoarthritis of left knee 12/20/2014   HTN (hypertension) 04/29/2013   Aortic stenosis 02/06/2013    Immunization History  Administered Date(s) Administered   Fluad Quad(high Dose 65+) 05/28/2021   Influenza Split 09/07/2018, 04/28/2019   Influenza, High Dose Seasonal PF 08/31/2018, 06/05/2020   PFIZER(Purple Top)SARS-COV-2 Vaccination 09/10/2019, 10/21/2019, 06/19/2020, 12/08/2020   Pfizer Covid-19 Vaccine Bivalent Booster 49yrs & up 06/22/2021   Pneumococcal Conjugate-13 01/27/2014   Pneumococcal Polysaccharide-23 09/10/2007   Pneumococcal-Unspecified 09/09/2008   Tdap 09/09/2009, 01/16/2010   Zoster Recombinat (Shingrix) 12/26/2016, 04/12/2017   Zoster, Live 09/09/2009, 03/15/2013   Patient has had only 1-2 episodes of dizziness in the last 6 months. Patient had a sharp headache a whole day a week ago and this usually happens once every week or so. Patient reports she feels like it's blood vessels related and has been a long time since it happened.  Patient was unaware of the change in dose for rosuvastatin to 10 mg. Patient has enough for 2 weeks for the double dose. Patient has been eating better and better and lost 5 lbs and has been walking almost 7 days a week. Patient has been doing the walking/exercising for a while and has been focusing the last month or so with vegetables.    Conditions to be addressed/monitored:  Hypertension, Hyperlipidemia, GERD, Hypothyroidism, Anxiety, Osteoporosis, Osteoarthritis, and Insomnia  Conditions addressed this visit: Hypertension, hyperlipidemia  Care  Plan : CCM Pharmacy Care Plan  Updates made by Viona Gilmore, [place] since 03/05/2022 12:00 AM     Problem: Problem: Hypertension, Hyperlipidemia, GERD, Hypothyroidism, Anxiety, Osteoporosis, Osteoarthritis, and Insomnia      Long-Range Goal: Patient-Specific Goal   Start Date: 03/02/2021  Expected End Date: 03/02/2022  Recent Progress: On track  Priority: High  Note:   Current Barriers:  Unable to independently monitor therapeutic efficacy Unable to achieve control of cholesterol  Unable to maintain control of blood pressure  Pharmacist Clinical Goal(s):  Patient will achieve adherence to monitoring guidelines and medication adherence to achieve therapeutic efficacy achieve control of cholesterol as evidenced by next lipid panel maintain control of blood pressure as evidenced by home and office BP readings  through collaboration with PharmD and provider.   Interventions: 1:1 collaboration with [place], Betty G, MD regarding development and update of comprehensive plan of care as evidenced by provider attestation and co-signature Inter-disciplinary care team collaboration (see longitudinal plan of care) Comprehensive medication review performed; medication list updated in electronic medical record  Hypertension (BP goal <140/90) -Controlled -Current treatment: Benazepril 20 mg 1/2 tablet in the morning and 1 tablet at bedtime - Appropriate, Query effective, Safe, Accessible -Medications previously tried: none  -Current home readings: 121/67, 123/66 (checking every other day - didn't check device at home)  -Current dietary habits: eating leaner meat and fruits and vegetables -Current exercise habits: walking every day -Denies hypotensive/hypertensive symptoms -Educated on BP goals and benefits of medications for prevention  of heart attack, stroke and kidney damage; Exercise goal of 150 minutes per week; Importance of home blood pressure monitoring; Proper BP monitoring  technique; -Counseled to monitor BP at home weekly, document, and provide log at future appointments -Counseled on diet and exercise extensively Recommended to continue current medication  Hyperlipidemia: (LDL goal < 70) -Uncontrolled -Current treatment: Rosuvastatin 5 mg 1 tablet daily - Appropriate, Query effective, Safe, Accessible -Medications previously tried: none  -Current dietary patterns: not eating fried foods and uses olive oil when cooking; drinks 1 glass of wine 4 nights a week; eating a lot more simple foods -Current exercise habits: walking; going to a chiropractor -Educated on Cholesterol goals;  Benefits of statin for ASCVD risk reduction; Importance of limiting foods high in cholesterol; Exercise goal of 150 minutes per week; -Counseled on diet and exercise extensively Counseled on PCP recommendation to increase rosuvastatin to 10 mg daily.  PAD (Goal: prevent heart events and improve circulation) -Controlled -Current treatment  Rosuvastatin 5 mg 1 tablet daily - Appropriate, Effective, Safe, Accessible -Medications previously tried: aspirin (bruising) -Recommended to continue current medication Counseled on benefits of taking aspirin given PAD indication to prevent heart events.  Depression/Anxiety/Insomnia (Goal: minimize symptoms and improve sleep) -Not ideally controlled -Current treatment: Citalopram 20 mg 1 tablet daily - Appropriate, Effective, Safe, Accessible Lorazepam 1 mg 1 tablet at bedtime as needed - Appropriate, Effective, Query Safe, Accessible -Medications previously tried/failed: none -PHQ9: 8 -GAD7: 7 -Educated on Benefits of medication for symptom control -Counseled on risk of falls with use of lorazepam Recommended discussion with PCP about alternative agents for sleep.   Osteopenia (Goal prevent fractures) -Controlled -Last DEXA Scan: 11/17/20  T-Score femoral neck: -2.1  T-Score total hip: n/a  T-Score lumbar spine: 0.7  T-Score  forearm radius: -1.5  10-year probability of major osteoporotic fracture: 16  10-year probability of hip fracture: 4.6% -Patient is not a candidate for pharmacologic treatment -Current treatment  Calcium-vit D-vit K 650-12.5 mcg 1 chew daily - Appropriate, Effective, Safe, Accessible Vitamin D 1000 units 2 capsules daily - Appropriate, Effective, Safe, Accessible -Medications previously tried: Alendronate (side effects) -Recommend [phone] units of vitamin D daily. Recommend 1200 mg of calcium daily from dietary and supplemental sources. Recommend weight-bearing and muscle strengthening exercises for building and maintaining bone density. -Counseled on diet and exercise extensively Recommended to continue current medication  Hypothyroidism (Goal: TSH 2.5-4.5 based on osteoporosis) -Controlled -Current treatment  Levothyroxine 88 mcg 1 tablet daily before breakfast - Appropriate, Effective, Safe, Accessible -Medications previously tried: none  -Recommended to continue current medication  GERD (Goal: minimize symptoms) -Controlled -Current treatment  Pantoprazole 40 mg 1 tablet three times a week - Appropriate, Effective, Safe, Accessible -Medications previously tried: none  -Counseled on duration of therapy with PPIs and long term risks of taking.   Health Maintenance -Vaccine gaps: tetanus -Current therapy:  Coenzyme Q10 50 mg 1 capsule daily Digestive enzymes Magnesium 100 mg 1 capsule daily Multivitamin 1 tablet daily Potassium gluconate 595 mg 1 tablet daily Selenium daily Adrenal support daily CV respiratory/lymphatic support daily Milk of magnesia as needed Biotin 1000 mcg 1 tablet daily Vitamin B complex daily Alpha lipoic acid 100 mg 1 tablet daily Olly skin - hyaluronic acid Circularity supplement daily -Educated on Herbal supplement research is limited and benefits usually cannot be proven Cost vs benefit of each product must be carefully weighed by individual  consumer Supplements may interfere with prescription drugs -Patient is satisfied with current therapy and denies issues -Recommended  to continue current medication  Patient Goals/Self-Care Activities Patient will:  - take medications as prescribed check blood pressure weekly, document, and provide at future appointments target a minimum of 150 minutes of moderate intensity exercise weekly  Follow Up Plan: Telephone follow up appointment with care management team member scheduled for: 6 months      Medication Assistance: None required.  Patient affirms current coverage meets needs.  Compliance/Adherence/Medication fill history: Care Gaps: BP 120/62 (02/15/22)  Star-Rating Drugs: Benazepril (Losentin) 20 mg - Last filled 02/23/22 90 DS at [facility] Rosuvastatin (Crestor) 5 mg - Last filled 01/24/2022 100 DS at [facility]  Patient's preferred pharmacy is:  [facility] ([place], [place] [address] [address] Phone: [phone] Fax: [phone]  [facility] ([facility] ) - [address], [place] [address] Phone: [phone] Fax: [phone]  [facility] [address], [place] [address] Phone: [phone] Fax: [phone]   Uses pill box? No - night medications are by her bed and morning ones are on top of her dresser  We discussed: Current pharmacy is preferred with insurance plan and patient is satisfied with pharmacy services Patient decided to: Continue current medication management strategy  Care Plan and Follow Up Patient Decision:  Patient agrees to Care Plan and Follow-up.  Plan: Telephone follow up appointment with care management team member scheduled for:  6 months  [name], PharmD, [place] Pharmacist [place] at  [place] [phone]

## 2022-03-08 ENCOUNTER — Other Ambulatory Visit: Payer: Self-pay | Admitting: Family Medicine

## 2022-03-08 DIAGNOSIS — M8580 Other specified disorders of bone density and structure, unspecified site: Secondary | ICD-10-CM

## 2022-03-08 DIAGNOSIS — E785 Hyperlipidemia, unspecified: Secondary | ICD-10-CM

## 2022-03-08 DIAGNOSIS — I739 Peripheral vascular disease, unspecified: Secondary | ICD-10-CM | POA: Diagnosis not present

## 2022-03-08 DIAGNOSIS — I10 Essential (primary) hypertension: Secondary | ICD-10-CM | POA: Diagnosis not present

## 2022-03-08 DIAGNOSIS — E039 Hypothyroidism, unspecified: Secondary | ICD-10-CM | POA: Diagnosis not present

## 2022-03-08 DIAGNOSIS — F32A Depression, unspecified: Secondary | ICD-10-CM | POA: Diagnosis not present

## 2022-03-08 DIAGNOSIS — F419 Anxiety disorder, unspecified: Secondary | ICD-10-CM

## 2022-03-08 NOTE — Telephone Encounter (Signed)
Last filled 02/06/22 - follow up scheduled for 11/3.

## 2022-03-29 DIAGNOSIS — M25511 Pain in right shoulder: Secondary | ICD-10-CM | POA: Diagnosis not present

## 2022-04-01 ENCOUNTER — Telehealth: Payer: Self-pay | Admitting: Pharmacist

## 2022-04-01 NOTE — Chronic Care Management (AMB) (Signed)
    Chronic Care Management Pharmacy Assistant   Name: Crystal Levy  MRN: [id number] DOB: Jan 23, 1941  Reason for Encounter: Reschedule follow up per MP   Recent office visits:  None  Recent consult visits:  None  Hospital visits:  None in previous 6 months  Medications: Outpatient Encounter Medications as of 04/01/2022  Medication Sig   Alpha-Lipoic Acid 100 MG TABS Take by mouth.   benazepril (LOTENSIN) 20 MG tablet 0.5 tab am and 1 tab pm   Calcium-Vitamin D-Vitamin K (VIACTIV CALCIUM PLUS D) 650-12.5-40 MG-MCG-MCG CHEW Chew 1 tablet by mouth daily.   Cholecalciferol (VITAMIN D-3) 1000 UNITS CAPS Take 2 capsules by mouth daily.   citalopram (CELEXA) 20 MG tablet TAKE 1 TABLET BY MOUTH  DAILY   co-enzyme Q-10 50 MG capsule Take 50 mg by mouth daily.   DIGESTIVE ENZYMES PO Take by mouth.   Iodine, Kelp, (KELP PO) Take 1 tablet by mouth daily as needed.   levothyroxine (SYNTHROID) 88 MCG tablet Take 1 tablet (88 mcg total) by mouth daily before breakfast.   LORazepam (ATIVAN) 1 MG tablet TAKE 1 TABLET BY MOUTH AT BEDTIME AS NEEDED FOR ANXIETY   Magnesium 100 MG CAPS Take by mouth.   Prenatal Vit-Fe Fumarate-FA (PRENATAL VITAMIN AND MINERAL PO) Take 1 tablet by mouth.   rosuvastatin (CRESTOR) 10 MG tablet Take 1 tablet (10 mg total) by mouth daily.   Selenium (SELENIMIN PO) Take by mouth daily.   UNABLE TO FIND Olly Skin   No facility-administered encounter medications on file as of 04/01/2022.   Notes: Call to patient per MP to move follow up call time to as she will be in a training. Patient aware and ok with the change, she further reports she has about a week ago decreased her Rosuvastatin back to 5 mg. She states her dull headaches had become more severe and persistent. Forwarded information to MP to see if this is ok or if there are any suggestions she has.  Per MP she has mentioned to PCP to see if she is ok to wait till following up with her in Nov or if she would  like to prescribe something else prior, patient aware.  Care Gaps: AWV- 6/22 BP- 120/62 02/15/22 AWV- 6/23 CCM- 1/24  Star Rating Drugs: Benazepril (Losentin) 20 mg - Last filled 02/23/22 90 DS at [facility] Rosuvastatin (Crestor) 5 mg - Last filled 01/24/2021 100 DS at [name] Pharmacist Assistant [phone]

## 2022-04-03 ENCOUNTER — Telehealth: Payer: Self-pay | Admitting: Family Medicine

## 2022-04-03 NOTE — Telephone Encounter (Signed)
Patient is having trouble seeing her messages on MyChart.  She is requesting a call from Maddie about her Crestor giving her headaches.

## 2022-04-08 MED ORDER — ROSUVASTATIN CALCIUM 5 MG PO TABS: 5.0000 mg | ORAL_TABLET | Freq: Every day | ORAL | 3 refills | Status: DC

## 2022-04-08 NOTE — Telephone Encounter (Signed)
Called patient to make her aware of the recommendations from PCP. PCP is ok with patient only taking 5 mg of Crestor and will plan to recheck her cholesterol at follow up in November. Updated medication list to reflect change to 5 mg of rosuvastatin. Requested new rx for 5 mg for patient to have on file at pharmacy when ready to fill.

## 2022-04-22 ENCOUNTER — Emergency Department (HOSPITAL_COMMUNITY): Payer: Medicare Other

## 2022-04-22 ENCOUNTER — Emergency Department (HOSPITAL_COMMUNITY)
Admission: EM | Admit: 2022-04-22 | Discharge: 2022-04-23 | Disposition: A | Discharge status: Home / Self Care | Payer: Medicare Other | Attending: Emergency Medicine | Admitting: Emergency Medicine

## 2022-04-22 DIAGNOSIS — S299XXA Unspecified injury of thorax, initial encounter: Secondary | ICD-10-CM

## 2022-04-22 DIAGNOSIS — M542 Cervicalgia: Secondary | ICD-10-CM | POA: Diagnosis not present

## 2022-04-22 DIAGNOSIS — I700 Atherosclerosis of aorta: Secondary | ICD-10-CM | POA: Diagnosis not present

## 2022-04-22 DIAGNOSIS — Y92410 Unspecified street and highway as the place of occurrence of the external cause: Secondary | ICD-10-CM | POA: Diagnosis not present

## 2022-04-22 DIAGNOSIS — M47814 Spondylosis without myelopathy or radiculopathy, thoracic region: Secondary | ICD-10-CM | POA: Diagnosis not present

## 2022-04-22 DIAGNOSIS — R0789 Other chest pain: Secondary | ICD-10-CM | POA: Diagnosis not present

## 2022-04-22 DIAGNOSIS — S161XXA Strain of muscle, fascia and tendon at neck level, initial encounter: Secondary | ICD-10-CM | POA: Diagnosis not present

## 2022-04-22 DIAGNOSIS — Z743 Need for continuous supervision: Secondary | ICD-10-CM | POA: Diagnosis not present

## 2022-04-22 DIAGNOSIS — M419 Scoliosis, unspecified: Secondary | ICD-10-CM | POA: Diagnosis not present

## 2022-04-22 DIAGNOSIS — S29012A Strain of muscle and tendon of back wall of thorax, initial encounter: Secondary | ICD-10-CM

## 2022-04-22 DIAGNOSIS — S169XXA Unspecified injury of muscle, fascia and tendon at neck level, initial encounter: Secondary | ICD-10-CM | POA: Diagnosis present

## 2022-04-22 DIAGNOSIS — R079 Chest pain, unspecified: Secondary | ICD-10-CM | POA: Diagnosis not present

## 2022-04-22 DIAGNOSIS — R6889 Other general symptoms and signs: Secondary | ICD-10-CM | POA: Diagnosis not present

## 2022-04-22 DIAGNOSIS — R072 Precordial pain: Secondary | ICD-10-CM | POA: Diagnosis not present
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
## 2022-04-23 NOTE — Discharge Instructions (Addendum)
If you develop new or worsening pain, headache, chest pain, trouble breathing, abdominal pain, numbness or weakness that is new, or any other new/concerning symptoms and return to the ER for evaluation.  You may take Tylenol and apply ice to the affected areas.

## 2022-04-23 NOTE — ED Notes (Signed)
DC instructions reviewed with pt.  All questions answered - no needs voiced.  Pt to lobby to wait on her ride to come.

## 2022-04-26 ENCOUNTER — Other Ambulatory Visit: Payer: Self-pay | Admitting: Family Medicine
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
## 2022-05-08 ENCOUNTER — Encounter: Payer: Self-pay | Admitting: Family Medicine

## 2022-05-08 ENCOUNTER — Ambulatory Visit (INDEPENDENT_AMBULATORY_CARE_PROVIDER_SITE_OTHER): Payer: Medicare Other | Admitting: Family Medicine

## 2022-05-08 VITALS — BP 118/70 | HR 78 | Resp 12 | Ht 65.0 in | Wt 144.0 lb

## 2022-05-08 DIAGNOSIS — R42 Dizziness and giddiness: Secondary | ICD-10-CM

## 2022-05-08 DIAGNOSIS — R0789 Other chest pain: Secondary | ICD-10-CM

## 2022-05-08 DIAGNOSIS — I10 Essential (primary) hypertension: Secondary | ICD-10-CM | POA: Diagnosis not present

## 2022-05-08 NOTE — Assessment & Plan Note (Addendum)
Re-checked 138/75 Adequately controlled today. Elevated BP's could be caused by pain. Continue monitoring BP at home. No changes in Benazepril dose. Continue low salt diet.
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
## 2022-05-12 ENCOUNTER — Encounter: Payer: Self-pay | Admitting: Family Medicine

## 2022-05-15 ENCOUNTER — Telehealth: Payer: Self-pay | Admitting: Pharmacist
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
## 2022-05-31 ENCOUNTER — Other Ambulatory Visit: Payer: Self-pay | Admitting: Family Medicine

## 2022-06-04 DIAGNOSIS — L814 Other melanin hyperpigmentation: Secondary | ICD-10-CM | POA: Diagnosis not present

## 2022-06-04 DIAGNOSIS — D225 Melanocytic nevi of trunk: Secondary | ICD-10-CM | POA: Diagnosis not present

## 2022-06-04 DIAGNOSIS — L570 Actinic keratosis: Secondary | ICD-10-CM | POA: Diagnosis not present

## 2022-06-04 DIAGNOSIS — L821 Other seborrheic keratosis: Secondary | ICD-10-CM | POA: Diagnosis not present

## 2022-06-10 ENCOUNTER — Telehealth (INDEPENDENT_AMBULATORY_CARE_PROVIDER_SITE_OTHER): Payer: Medicare Other | Admitting: Family Medicine

## 2022-06-10 ENCOUNTER — Other Ambulatory Visit: Payer: Medicare Other

## 2022-06-10 ENCOUNTER — Encounter: Payer: Self-pay | Admitting: Family Medicine

## 2022-06-10 ENCOUNTER — Ambulatory Visit (INDEPENDENT_AMBULATORY_CARE_PROVIDER_SITE_OTHER): Payer: Medicare Other

## 2022-06-10 VITALS — BP 122/66 | HR 101 | Ht 65.0 in

## 2022-06-10 DIAGNOSIS — R059 Cough, unspecified: Secondary | ICD-10-CM | POA: Diagnosis not present

## 2022-06-10 DIAGNOSIS — R051 Acute cough: Secondary | ICD-10-CM | POA: Diagnosis not present

## 2022-06-10 DIAGNOSIS — R0789 Other chest pain: Secondary | ICD-10-CM | POA: Diagnosis not present

## 2022-06-10 DIAGNOSIS — J069 Acute upper respiratory infection, unspecified: Secondary | ICD-10-CM | POA: Diagnosis not present

## 2022-06-10 DIAGNOSIS — R509 Fever, unspecified: Secondary | ICD-10-CM | POA: Diagnosis not present

## 2022-06-10 NOTE — Progress Notes (Signed)
Virtual Visit via Video Note I connected with Crystal Levy on 06/10/22 by a video enabled telemedicine application and verified that I am speaking with the correct person using two identifiers.  Location patient: home Location provider:work office Persons participating in the virtual visit: patient, provider  I discussed the limitations of evaluation and management by telemedicine and the availability of in person appointments. The patient expressed understanding and agreed to proceed.  Chief Complaint  Patient presents with   flu like symptoms    X 6 days   HPI: Crystal Levy is a 81 year old pleasant female with history of hypertension, hypothyroidism, GERD, PAD, and anxiety complaining of 6 days of respiratory symptoms. This past Wednesday she started with fatigue, fever, chills, sore throat, productive cough, and mild nausea. 2 to 3 days ago she woke up around 2:30 AM with wheezing and not able to breathe for a few minutes.  She did not seek medical attention, these symptoms have resolved. Fever lasted 3 days, max temp 100.2 F. Productive cough with yellowish sputum and sometimes greenish. Chest wall pain with coughing spells. Cough does not interfere with his sleep.  Today she is feeling better. She has taken Benadryl and Tylenol. No known sick contact. She is concerned about possible pneumonia.  Reporting their her HR is elevated, 101/min, earlier it was 78/min. Negative for CP, palpitation, diaphoresis, worsening LE edema,or erythema..  ROS: See pertinent positives and negatives per HPI.  Past Medical History:  Diagnosis Date   Anxiety    Arthritis    Bicuspid aortic valve    Depression    Hypertension    Hypothyroidism    Polio    Thyroid disease     Past Surgical History:  Procedure Laterality Date   ABDOMINAL HYSTERECTOMY     BREAST ENHANCEMENT SURGERY     BREAST SURGERY     BUNIONECTOMY     CARDIAC CATHETERIZATION     EYE SURGERY     HAMMER TOE SURGERY      HERNIA REPAIR     JOINT REPLACEMENT     left hip   KNEE SURGERY     meniscus   RADICAL VAGINAL HYSTERECTOMY  1993   TONSILLECTOMY     TOTAL KNEE ARTHROPLASTY Left 12/20/2014   Procedure: LEFT TOTAL KNEE ARTHROPLASTY;  Surgeon: Melrose Nakayama, MD;  Location: [facility];  Service: Orthopedics;  Laterality: Left;    Family History  Problem Relation Age of Onset   CAD Mother 78   Aortic stenosis Mother     Social History   Socioeconomic History   Marital status: Divorced    Spouse name: Not on file   Number of children: 2   Years of education: Not on file   Highest education level: Master's degree (e.g., MA, MS, MEng, MEd, MSW, MBA)  Occupational History    Employer: [employer]  Tobacco Use   Smoking status: Never   Smokeless tobacco: Never   Tobacco comments:    Rare smoker in the distant past.   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    Social Determinants of Health   Financial Resource Strain: Low Risk  (03/05/2022)   Overall Financial Resource Strain (CARDIA)  Difficulty of Paying Living Expenses: Not hard at all  Recent Concern: Financial Resource Strain - Medium Risk (01/03/2022)   Overall Financial Resource Strain (CARDIA)    Difficulty of Paying Living Expenses: Somewhat hard  Food Insecurity: No Food Insecurity (02/15/2022)   Hunger Vital Sign    Worried About Running Out of Food in the Last Year: Never true    [place] in the Last Year: Never true  Transportation Needs: No Transportation Needs (02/15/2022)   PRAPARE - Hydrologist (Medical): No    Lack of Transportation (Non-Medical): No  Physical Activity: Sufficiently Active (02/15/2022)   Exercise Vital Sign    Days of Exercise  per Week: 7 days    Minutes of Exercise per Session: 30 min  Recent Concern: Physical Activity - Insufficiently Active (01/03/2022)   Exercise Vital Sign    Days of Exercise per Week: 5 days    Minutes of Exercise per Session: 20 min  Stress: No Stress Concern Present (02/15/2022)   [place]    Feeling of Stress : Not at all  Recent Concern: Stress - Stress Concern Present (01/03/2022)   [place]    Feeling of Stress : To some extent  Social Connections: Socially Isolated (02/15/2022)   Social Connection and Isolation Panel [NHANES]    Frequency of Communication with Friends and Family: More than three times a week    Frequency of Social Gatherings with Friends and Family: More than three times a week    Attends Religious Services: Never    Marine scientist or Organizations: No    Attends Archivist Meetings: Never    Marital Status: Divorced  Human resources officer Violence: Not At Risk (02/15/2022)   Humiliation, Afraid, Rape, and Kick questionnaire    Fear of Current or Ex-Partner: No    Emotionally Abused: No    Physically Abused: No    Sexually Abused: No    Current Outpatient Medications:    benazepril (LOTENSIN) 20 MG tablet, TAKE ONE-HALF TABLET BY MOUTH IN THE MORNING AND 1 TABLET BY  MOUTH IN THE EVENING, Disp: 135 tablet, Rfl: 1   Calcium-Vitamin D-Vitamin K (VIACTIV CALCIUM PLUS D) 650-12.5-40 MG-MCG-MCG CHEW, Chew 1 tablet by mouth daily., Disp: , Rfl:    Cholecalciferol (VITAMIN D-3) 1000 UNITS CAPS, Take 2 capsules by mouth daily., Disp: , Rfl:    citalopram (CELEXA) 20 MG tablet, TAKE 1 TABLET BY MOUTH  DAILY, Disp: 90 tablet, Rfl: 3   co-enzyme Q-10 50 MG capsule, Take 50 mg by mouth daily., Disp: , Rfl:    DIGESTIVE ENZYMES PO, Take by mouth., Disp: , Rfl:    Iodine, Kelp, (KELP PO), Take 1 tablet by mouth daily as needed.,  Disp: , Rfl:    levothyroxine (SYNTHROID) 88 MCG tablet, TAKE 1 TABLET BY MOUTH DAILY  BEFORE BREAKFAST, Disp: 100 tablet, Rfl: 2   LORazepam (ATIVAN) 1 MG tablet, TAKE 1 TABLET BY MOUTH AT BEDTIME AS NEEDED FOR ANXIETY, Disp: 30 tablet, Rfl: 3   Magnesium 100 MG CAPS, Take by mouth., Disp: , Rfl:    Prenatal Vit-Fe Fumarate-FA (PRENATAL VITAMIN AND MINERAL PO), Take 1 tablet by mouth., Disp: , Rfl:    rosuvastatin (CRESTOR) 5 MG tablet, Take 1 tablet (5 mg total) by mouth daily., Disp: 90 tablet, Rfl: 3   Selenium (SELENIMIN PO), Take by mouth daily., Disp: ,  Rfl:    UNABLE TO FIND, Olly Skin, Disp: , Rfl:   EXAM:  VITALS per patient if applicable:BP 233/00   Pulse (!) 101 Comment: Initially 78/min and right before visit 101/min  Ht '5\' 5"'$  (1.651 m)   BMI 23.96 kg/m   GENERAL: alert, oriented, appears well and in no acute distress  HEENT: atraumatic, conjunctiva clear, no obvious abnormalities on inspection of external nose and ears  NECK: normal movements of the head and neck  LUNGS: on inspection no signs of respiratory distress, breathing rate appears normal, no obvious gross SOB, gasping or wheezing. Productive cough once during visit.  CV: no obvious cyanosis. Mildly tachycardic.  MS: moves all visible extremities without noticeable abnormality  PSYCH/NEURO: pleasant and cooperative, no obvious depression, mild anxiety. Speech and thought processing grossly intact  ASSESSMENT AND PLAN:  Discussed the following assessment and plan:  URI, acute Symptoms suggests a viral etiology and improving, recommend continuing symptomatic treatment. Adequate hydration. Tylenol 500 mg 3-4 times as needed. Instructed to monitor for signs of complications, including recurrent  fever among some, clearly instructed about warning signs. F/U as needed.  Acute cough - Plan: DG Chest 2 View Explained that cough and congestion can last a few more days and even weeks after acute symptoms have  resolved. OTC plain Mucinex may help. She is concerned about serious infectious process, she will be coming to the clinic today at 2 PM to have a chest x-ray done.  HR mildly elevated, history does not suggest a serious process.  Recommend continued monitoring HR.  We discussed possible serious and likely etiologies, options for evaluation and workup, limitations of telemedicine visit vs in person visit, treatment, treatment risks and precautions. The patient was advised to call back or seek an in-person evaluation if the symptoms worsen or if the condition fails to improve as anticipated. I discussed the assessment and treatment plan with the patient. The patient was provided an opportunity to ask questions and all were answered. The patient agreed with the plan and demonstrated an understanding of the instructions.  Return if symptoms worsen or fail to improve.  Mathilde Mcwherter Martinique, MD

## 2022-06-12 ENCOUNTER — Encounter: Payer: Self-pay | Admitting: Family Medicine

## 2022-06-12 DIAGNOSIS — I700 Atherosclerosis of aorta: Secondary | ICD-10-CM | POA: Insufficient documentation

## 2022-07-01 ENCOUNTER — Telehealth: Payer: Self-pay | Admitting: Pharmacist

## 2022-07-01 MED ORDER — BENAZEPRIL HCL 20 MG PO TABS: ORAL_TABLET | ORAL | 0 refills | Status: DC

## 2022-07-01 NOTE — Telephone Encounter (Signed)
Patient called as she is completely out of benazepril and wanted a week supply sent in to [facility] on [address]. [facility] is sending her a 90 ds but it won't arrive until anywhere from 10/25-10/28 so she does not want to be without the medication. Recommended for patient to call [facility] in a couple of hours if she doesn't hear anything as they may get a rejection for a refill too soon.

## 2022-07-11 ENCOUNTER — Other Ambulatory Visit: Payer: Self-pay | Admitting: Family Medicine

## 2022-07-11 DIAGNOSIS — F419 Anxiety disorder, unspecified: Secondary | ICD-10-CM

## 2022-07-12 ENCOUNTER — Ambulatory Visit: Payer: Medicare Other | Admitting: Family Medicine

## 2022-07-12 NOTE — Telephone Encounter (Signed)
Last filled 06/11/22 Upcoming appt on 07/17/22

## 2022-07-16 NOTE — Progress Notes (Deleted)
HPI:  [name] is a 81 y.o. female, who is here today to follow on recent visit.  Review of Systems Rest see pertinent positives and negatives per HPI.  Current Outpatient Medications on File Prior to Visit  Medication Sig Dispense Refill   benazepril (LOTENSIN) 20 MG tablet TAKE ONE-HALF TABLET BY MOUTH IN THE MORNING AND 1 TABLET BY  MOUTH IN THE EVENING 15 tablet 0   Calcium-Vitamin D-Vitamin K (VIACTIV CALCIUM PLUS D) 650-12.5-40 MG-MCG-MCG CHEW Chew 1 tablet by mouth daily.     Cholecalciferol (VITAMIN D-3) 1000 UNITS CAPS Take 2 capsules by mouth daily.     citalopram (CELEXA) 20 MG tablet TAKE 1 TABLET BY MOUTH  DAILY 90 tablet 3   co-enzyme Q-10 50 MG capsule Take 50 mg by mouth daily.     DIGESTIVE ENZYMES PO Take by mouth.     Iodine, Kelp, (KELP PO) Take 1 tablet by mouth daily as needed.     levothyroxine (SYNTHROID) 88 MCG tablet TAKE 1 TABLET BY MOUTH DAILY  BEFORE BREAKFAST 100 tablet 2   LORazepam (ATIVAN) 1 MG tablet TAKE 1 TABLET BY MOUTH AT BEDTIME AS NEEDED FOR ANXIETY 30 tablet 3   Magnesium 100 MG CAPS Take by mouth.     Prenatal Vit-Fe Fumarate-FA (PRENATAL VITAMIN AND MINERAL PO) Take 1 tablet by mouth.     rosuvastatin (CRESTOR) 5 MG tablet Take 1 tablet (5 mg total) by mouth daily. 90 tablet 3   Selenium (SELENIMIN PO) Take by mouth daily.     UNABLE TO FIND Olly Skin     No current facility-administered medications on file prior to visit.    Past Medical History:  Diagnosis Date   Anxiety    Arthritis    Bicuspid aortic valve    Depression    Hypertension    Hypothyroidism    Polio    Thyroid disease    Allergies  Allergen Reactions   Clindamycin/Lincomycin Diarrhea and Other (See Comments)    C-Diff   Fluticasone     Headache and nosebleed    Azithromycin    Eggs Or Egg-Derived Products    Milk-Related Compounds     Unknown reaction   Penicillins Rash   Sulfa Antibiotics Diarrhea   Sulfamethoxazole-Trimethoprim     Fenofibrate Rash    Social History   Socioeconomic History   Marital status: Divorced    Spouse name: Not on file   Number of children: 2   Years of education: Not on file   Highest education level: Master's degree (e.g., MA, MS, MEng, MEd, MSW, MBA)  Occupational History    Employer: [employer]  Tobacco Use   Smoking status: Never   Smokeless tobacco: Never   Tobacco comments:    Rare smoker in the distant past.   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    Social Determinants of Health   Financial Resource Strain: Low Risk  (03/05/2022)   Overall Financial Resource Strain (CARDIA)    Difficulty of Paying Living Expenses: Not hard at all  Recent Concern: Financial Resource Strain - Medium Risk (01/03/2022)   Overall Financial Resource Strain (CARDIA)    Difficulty of Paying  Living Expenses: Somewhat hard  Food Insecurity: No Food Insecurity (02/15/2022)   Hunger Vital Sign    Worried About Running Out of Food in the Last Year: Never true    Ran Out of Food in the Last Year: Never true  Transportation Needs: No Transportation Needs (02/15/2022)   PRAPARE - Hydrologist (Medical): No    Lack of Transportation (Non-Medical): No  Physical Activity: Sufficiently Active (02/15/2022)   Exercise Vital Sign    Days of Exercise per Week: 7 days    Minutes of Exercise per Session: 30 min  Recent Concern: Physical Activity - Insufficiently Active (01/03/2022)   Exercise Vital Sign    Days of Exercise per Week: 5 days    Minutes of Exercise per Session: 20 min  Stress: No Stress Concern Present (02/15/2022)   [place]    Feeling of Stress : Not at all   Recent Concern: Stress - Stress Concern Present (01/03/2022)   [place]    Feeling of Stress : To some extent  Social Connections: Socially Isolated (02/15/2022)   Social Connection and Isolation Panel [NHANES]    Frequency of Communication with Friends and Family: More than three times a week    Frequency of Social Gatherings with Friends and Family: More than three times a week    Attends Religious Services: Never    Marine scientist or Organizations: No    Attends Archivist Meetings: Never    Marital Status: Divorced    There were no vitals filed for this visit. There is no height or weight on file to calculate BMI.  Physical Exam  ASSESSMENT AND PLAN:   There are no diagnoses linked to this encounter.  No orders of the defined types were placed in this encounter.   No problem-specific Assessment & Plan notes found for this encounter.   No follow-ups on file.   Crystal G. Martinique, MD  [facility]. [place] office.

## 2022-07-17 ENCOUNTER — Telehealth (INDEPENDENT_AMBULATORY_CARE_PROVIDER_SITE_OTHER): Payer: Medicare Other | Admitting: Family Medicine

## 2022-07-17 ENCOUNTER — Ambulatory Visit: Payer: Medicare Other | Admitting: Family Medicine

## 2022-07-17 ENCOUNTER — Telehealth: Payer: Self-pay | Admitting: Family Medicine

## 2022-07-17 ENCOUNTER — Encounter: Payer: Self-pay | Admitting: Family Medicine

## 2022-07-17 VITALS — BP 120/71 | HR 92 | Ht 65.0 in

## 2022-07-17 DIAGNOSIS — I739 Peripheral vascular disease, unspecified: Secondary | ICD-10-CM

## 2022-07-17 DIAGNOSIS — I10 Essential (primary) hypertension: Secondary | ICD-10-CM | POA: Diagnosis not present

## 2022-07-17 DIAGNOSIS — B349 Viral infection, unspecified: Secondary | ICD-10-CM | POA: Diagnosis not present

## 2022-07-17 DIAGNOSIS — R002 Palpitations: Secondary | ICD-10-CM | POA: Diagnosis not present

## 2022-07-17 DIAGNOSIS — I700 Atherosclerosis of aorta: Secondary | ICD-10-CM | POA: Diagnosis not present

## 2022-07-17 DIAGNOSIS — F419 Anxiety disorder, unspecified: Secondary | ICD-10-CM

## 2022-07-17 MED ORDER — LORAZEPAM 1 MG PO TABS: ORAL_TABLET | ORAL | 0 refills | Status: DC

## 2022-07-17 NOTE — Assessment & Plan Note (Signed)
We discussed chest x-ray findings. She has had some headache since she started rosuvastatin, so she will hold the medication for about 3 weeks and if headache resolved, we can try lovastatin, if it does not she can resume rosuvastatin and we need to consider other possible causes of headache. Continue low-fat diet. We discussed LDL goals. Follow-up in 5 to 6 months.

## 2022-07-17 NOTE — Telephone Encounter (Signed)
Error. Please disregard

## 2022-07-17 NOTE — Progress Notes (Signed)
Virtual Visit via Video Note I connected with Crystal Levy on 07/17/22 by a video enabled telemedicine application and verified that I am speaking with the correct person using two identifiers.  Location patient: home Location provider:work or home office Persons participating in the virtual visit: patient, provider  I discussed the limitations of evaluation and management by telemedicine and the availability of in person appointments. The patient expressed understanding and agreed to proceed.  Chief Complaint  Patient presents with   Fever    Started today   Follow-up   HPI: Crystal Levy is a 81 year old female with history of aortic atherosclerosis, PAD, hypertension, GERD, hypothyroidism, generalized OA, hyperlipidemia, and anxiety being seen today through video for follow-up.  She is reporting fever of 100.90F, which started this morning. She reports increased fatigue, watery eyes, and body aches in the past two days. Today she started with generalized body aches. No known sick contacts or recent travel. States that she has not fully recovered from last respiratory tract infection, she has been experiencing a dry cough since the end of September/2023. Negative for dyspnea or wheezing.  She reports experiencing a rapid heart rate last night, with readings of 94, 91, and 92 bpm. She has also experienced lightheadedness on two occasions in the past two weeks while grocery shopping, last episode yesterday. She denies any chest pain,diaphoresis, or SOB during these episodes. She has had episodes of lightheadedness in the past but not as bad as the last 2 episodes. She has been drinking fluids as usual, 7 glasses of water per day.  Hypertension: Currently she is on benazepril 20 mg daily. She has been monitoring her blood pressure at home, which has been fluctuating between the 120s and 140s/70's.  Lab Results  Component Value Date   CREATININE 0.63 01/07/2022   BUN 11 01/07/2022    NA 139 01/07/2022   K 4.1 01/07/2022   CL 102 01/07/2022   CO2 29 01/07/2022   Hyperlipidemia: She has been taking rosuvastatin (Crestor) 5 mg daily.  She reports experiencing daily headaches since starting the medication, did not improve after decreasing dose from 10 mg daily. PAD, she denies claudication-like symptoms. History of polio with residual right-sided weakness. She is trying to follow a healthier diet trying to lose some weight given the fact he has become more difficult for her to exercise regularly. ABI in 03/2020: Right: Resting right ankle-brachial index indicates moderate right lower extremity arterial disease. The right toe-brachial index is abnormal. RT great toe pressure = 94 mmHg.  Left: Resting left ankle-brachial index is within normal range. The left toe-brachial index is abnormal. LT Great toe pressure = 85 mmHg.   Lab Results  Component Value Date   CHOL 196 01/07/2022   HDL 58.50 01/07/2022   LDLCALC 110 (H) 01/07/2022   LDLDIRECT 111.0 12/26/2020   TRIG 140.0 01/07/2022   CHOLHDL 3 01/07/2022   Anxiety: Currently she is on lorazepam 1 mg 1/2 to 1 tablet daily as needed.  Her pharmacy does not have medication, she would like a prescription to be sent to a different pharmacy. She is also on Celexa 20 mg daily. She has been on this regimen for a few years,symptoms as stable.  ROS: See pertinent positives and negatives per HPI.  Past Medical History:  Diagnosis Date   Anxiety    Arthritis    Bicuspid aortic valve    Depression    Hypertension    Hypothyroidism    Polio    Thyroid  disease    Past Surgical History:  Procedure Laterality Date   ABDOMINAL HYSTERECTOMY     BREAST ENHANCEMENT SURGERY     BREAST SURGERY     BUNIONECTOMY     CARDIAC CATHETERIZATION     EYE SURGERY     HAMMER TOE SURGERY     HERNIA REPAIR     JOINT REPLACEMENT     left hip   KNEE SURGERY     meniscus   RADICAL VAGINAL HYSTERECTOMY  1993   TONSILLECTOMY     TOTAL  KNEE ARTHROPLASTY Left 12/20/2014   Procedure: LEFT TOTAL KNEE ARTHROPLASTY;  Surgeon: Melrose Nakayama, MD;  Location: [facility];  Service: Orthopedics;  Laterality: Left;   Family History  Problem Relation Age of Onset   CAD Mother 27   Aortic stenosis Mother     Social History   Socioeconomic History   Marital status: Divorced    Spouse name: Not on file   Number of children: 2   Years of education: Not on file   Highest education level: Master's degree (e.g., MA, MS, MEng, MEd, MSW, MBA)  Occupational History    Employer: [employer]  Tobacco Use   Smoking status: Never   Smokeless tobacco: Never   Tobacco comments:    Rare smoker in the distant past.   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    Social Determinants of Health   Financial Resource Strain: Low Risk  (03/05/2022)   Overall Financial Resource Strain (CARDIA)    Difficulty of Paying Living Expenses: Not hard at all  Recent Concern: Financial Resource Strain - Medium Risk (01/03/2022)   Overall Financial Resource Strain (CARDIA)    Difficulty of Paying Living Expenses: Somewhat hard  Food Insecurity: No Food Insecurity (02/15/2022)   Hunger Vital Sign    Worried About Running Out of Food in the Last Year: Never true    [place] in the Last Year: Never true  Transportation Needs: No Transportation Needs (02/15/2022)   PRAPARE - Hydrologist (Medical): No    Lack of Transportation (Non-Medical): No  Physical Activity: Sufficiently Active (02/15/2022)   Exercise Vital Sign    Days of Exercise per Week: 7 days    Minutes of Exercise per Session: 30 min  Recent Concern: Physical Activity - Insufficiently Active (01/03/2022)   Exercise Vital Sign     Days of Exercise per Week: 5 days    Minutes of Exercise per Session: 20 min  Stress: No Stress Concern Present (02/15/2022)   [place]    Feeling of Stress : Not at all  Recent Concern: Stress - Stress Concern Present (01/03/2022)   [place]    Feeling of Stress : To some extent  Social Connections: Socially Isolated (02/15/2022)   Social Connection and Isolation Panel [NHANES]    Frequency of Communication with Friends and Family: More than three times a week    Frequency of Social Gatherings with Friends and Family: More than three times a week    Attends Religious Services: Never    Active Member of  Clubs or Organizations: No    Attends Archivist Meetings: Never    Marital Status: Divorced  Human resources officer Violence: Not At Risk (02/15/2022)   Humiliation, Afraid, Rape, and Kick questionnaire    Fear of Current or Ex-Partner: No    Emotionally Abused: No    Physically Abused: No    Sexually Abused: No    Current Outpatient Medications:    benazepril (LOTENSIN) 20 MG tablet, TAKE ONE-HALF TABLET BY MOUTH IN THE MORNING AND 1 TABLET BY  MOUTH IN THE EVENING, Disp: 15 tablet, Rfl: 0   Calcium-Vitamin D-Vitamin K (VIACTIV CALCIUM PLUS D) 650-12.5-40 MG-MCG-MCG CHEW, Chew 1 tablet by mouth daily., Disp: , Rfl:    Cholecalciferol (VITAMIN D-3) 1000 UNITS CAPS, Take 2 capsules by mouth daily., Disp: , Rfl:    citalopram (CELEXA) 20 MG tablet, TAKE 1 TABLET BY MOUTH  DAILY, Disp: 90 tablet, Rfl: 3   co-enzyme Q-10 50 MG capsule, Take 50 mg by mouth daily., Disp: , Rfl:    DIGESTIVE ENZYMES PO, Take by mouth., Disp: , Rfl:    Iodine, Kelp, (KELP PO), Take 1 tablet by mouth daily as needed., Disp: , Rfl:    levothyroxine (SYNTHROID) 88 MCG tablet, TAKE 1 TABLET BY MOUTH DAILY  BEFORE BREAKFAST, Disp: 100 tablet, Rfl: 2   Magnesium 100 MG CAPS, Take  by mouth., Disp: , Rfl:    Prenatal Vit-Fe Fumarate-FA (PRENATAL VITAMIN AND MINERAL PO), Take 1 tablet by mouth., Disp: , Rfl:    rosuvastatin (CRESTOR) 5 MG tablet, Take 1 tablet (5 mg total) by mouth daily., Disp: 90 tablet, Rfl: 3   Selenium (SELENIMIN PO), Take by mouth daily., Disp: , Rfl:    UNABLE TO FIND, Olly Skin, Disp: , Rfl:    LORazepam (ATIVAN) 1 MG tablet, TAKE 1 TABLET BY MOUTH AT BEDTIME AS NEEDED FOR ANXIETY, Disp: 30 tablet, Rfl: 0  EXAM:  VITALS per patient if applicable:BP 244/01   Pulse 92   Ht '5\' 5"'$  (1.651 m)   BMI 23.96 kg/m   GENERAL: alert, oriented, appears well and in no acute distress  HEENT: atraumatic, conjunctiva clear, no obvious abnormalities on inspection.  NECK: normal movements of the head and neck  LUNGS: on inspection no signs of respiratory distress, breathing rate appears normal, no obvious gross SOB, gasping or wheezing  CV: no obvious cyanosis  MS: moves all visible extremities without noticeable abnormality  PSYCH/NEURO: pleasant and cooperative, no obvious depression or anxiety, speech and thought processing grossly intact  ASSESSMENT AND PLAN:  Discussed the following assessment and plan: Orders Placed This Encounter  Procedures   Ambulatory referral to Cardiology   Palpitation - Plan: Ambulatory referral to Cardiology HR still in normal range but higher than her baseline. She is concerned about serious process and given her recent episodes of lightheadedness, I think it is appropriate to arrange cardiac evaluation. Continue monitoring HR. Instructed about warning signs.  Acute viral syndrome Continue monitoring temperature. Tylenol 500 mg 3-4 times per day as needed. Monitor for new symptoms. Instructed to perform a COVID-19 test, she has son at home, and to let me know if positive.  HTN (hypertension) BP adequately controlled. Continue benazepril 20 mg daily and low-salt diet. Continue monitoring BP  regularly.  Atherosclerosis of aorta ([place]) We discussed chest x-ray findings. She has had some headache since she started rosuvastatin, so she will hold the medication for about 3 weeks and if headache resolved, we can try lovastatin, if it does  not she can resume rosuvastatin and we need to consider other possible causes of headache. Continue low-fat diet. We discussed LDL goals. Follow-up in 5 to 6 months.  PAD (peripheral artery disease) ([place]) She is not reporting claudication-like symptoms. Having some side effects from rosuvastatin, headaches. We can consider lovastatin if in fact rosuvastatin is causing headaches.  Anxiety disorder, unspecified Problem is stable. [facility] pharmacy does not have l[facility]azepam available. Continue Celexa 20 mg daily and lorazepam 1 mg 1/2 to 1 tablet daily as needed. Follow-up in 5 to 6 months, before if needed.  We discussed possible serious and likely etiologies, options for evaluation and workup, limitations of telemedicine visit vs in person visit, treatment, treatment risks and precautions. The patient was advised to call back or seek an in-person evaluation if the symptoms worsen or if the condition fails to improve as anticipated. I discussed the assessment and treatment plan with the patient. The patient was provided an opportunity to ask questions and all were answered. The patient agreed with the plan and demonstrated an understanding of the instructions.  I spent a total of 41 minutes in both face to face and non face to face activities for this visit on the date of this encounter. During this time history was obtained and documented, examination was performed, prior labs/imaging reviewed, and assessment/plan discussed.  Return in about 6 months (around 01/15/2023).  Calinda Stockinger Martinique, MD

## 2022-07-17 NOTE — Telephone Encounter (Signed)
Pt called to ask if the Rx for the  LORazepam (ATIVAN) 1 MG tablet   can be sent to the:   [id number] [facility] Phone: [phone] Fax: [phone]   Pt wanted to inform MD that she has been cutting them in half, to ration them out, because she only has a few left.

## 2022-07-17 NOTE — Assessment & Plan Note (Signed)
She is not reporting claudication-like symptoms. Having some side effects from rosuvastatin, headaches. We can consider lovastatin if in fact rosuvastatin is causing headaches.

## 2022-07-17 NOTE — Addendum Note (Signed)
Addended by: Nathanial Millman E on: 07/17/2022 10:12 AM   Modules accepted: Orders

## 2022-07-17 NOTE — Assessment & Plan Note (Signed)
Problem is stable. [facility] because [facility] pharmacy does not have lorazepam available. Continue Celexa 20 mg daily and lorazepam 1 mg 1/2 to 1 tablet daily as needed. Follow-up in 5 to 6 months, before if needed.

## 2022-07-17 NOTE — Assessment & Plan Note (Signed)
BP adequately controlled. Continue benazepril 20 mg daily and low-salt diet. Continue monitoring BP regularly.

## 2022-07-19 ENCOUNTER — Telehealth: Payer: Medicare Other | Admitting: Family Medicine

## 2022-07-23 NOTE — Progress Notes (Signed)
CARDIOLOGY CONSULT NOTE       Patient ID: Crystal Levy MRN: [id number] DOB/AGE: August 25, 1941 81 y.o.  Admit date: (Not on file) Referring Physician: Martinique Primary Physician: Martinique, Betty G, MD Primary Cardiologist: New Reason for Consultation: Palpitations  Active Problems:   * No active hospital problems. *   HPI:  81 y.o. referred by Dr Martinique for palpitations History of PAD, HTN, GERD, hypothyroidism, OA, HLD and anxiety Seen by primary 07/17/22 with  URI and low grade fever With this felt rapid heart beating but pulse only in 90's Associated with some lightheadedness BP at home fine not low Using Benazepril 20 mg daily for HTN  She has some RLE weakness from polio   LDL 111 She complained of Headache with crestor  TSH normal 2.23   ABI 03/30/20 Right 0.68 left normal   ECG:  SR rate 93 nonspecific ST changes 04/23/22  She saw Dr Percival Spanish in 2014 TTE report suggested possible bicuspid AV but no significant AR/AS  No f/u   She is widowed. Has a son/grandson in [place] Another son in [place] with Asperger estranged She has a new splint for her right foot and walks daily with cane and has hand weights No claudication Or chest pain    ROS  All other systems reviewed and negative except as noted above  Past Medical History:  Diagnosis Date   Anxiety    Arthritis    Bicuspid aortic valve    Depression    Hypertension    Hypothyroidism    Polio    Thyroid disease     Family History  Problem Relation Age of Onset   CAD Mother 12   Aortic stenosis Mother     Social History   Socioeconomic History   Marital status: Divorced    Spouse name: Not on file   Number of children: 2   Years of education: Not on file   Highest education level: Master's degree (e.g., MA, MS, MEng, MEd, MSW, MBA)  Occupational History    Employer: [employer]  Tobacco Use   Smoking status: Never   Smokeless tobacco: Never   Tobacco comments:    Rare smoker in the distant  past.   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    Social Determinants of Health   Financial Resource Strain: Low Risk  (03/05/2022)   Overall Financial Resource Strain (CARDIA)    Difficulty of Paying Living Expenses: Not hard at all  Recent Concern: Financial Resource Strain - Medium Risk (01/03/2022)   Overall Financial Resource Strain (CARDIA)    Difficulty of Paying Living Expenses: Somewhat hard  Food Insecurity: No Food Insecurity (02/15/2022)   Hunger Vital Sign    Worried About Running Out of Food in the Last Year: Never true    [place] in the Last Year: Never true  Transportation Needs: No Transportation Needs (02/15/2022)   PRAPARE - Hydrologist (Medical): No    Lack of Transportation (Non-Medical): No  Physical Activity: Sufficiently Active (02/15/2022)   Exercise Vital Sign    Days of Exercise per Week: 7 days    Minutes of Exercise per Session: 30 min  Recent Concern: Physical Activity - Insufficiently Active (01/03/2022)   Exercise Vital Sign    Days of Exercise per Week: 5 days    Minutes of Exercise per Session: 20 min  Stress: No Stress Concern Present (02/15/2022)   [place]    Feeling of Stress : Not at all  Recent Concern: Stress - Stress Concern Present (01/03/2022)   [place]    Feeling of Stress : To some extent  Social Connections: Socially Isolated (02/15/2022)   Social Connection and Isolation Panel [NHANES]    Frequency of Communication with Friends and Family: More than three times a week    Frequency of Social Gatherings with Friends and Family: More than  three times a week    Attends Religious Services: Never    Marine scientist or Organizations: No    Attends Archivist Meetings: Never    Marital Status: Divorced  Human resources officer Violence: Not At Risk (02/15/2022)   Humiliation, Afraid, Rape, and Kick questionnaire    Fear of Current or Ex-Partner: No    Emotionally Abused: No    Physically Abused: No    Sexually Abused: No    Past Surgical History:  Procedure Laterality Date   ABDOMINAL HYSTERECTOMY     BREAST ENHANCEMENT SURGERY     BREAST SURGERY     BUNIONECTOMY     CARDIAC CATHETERIZATION     EYE SURGERY     HAMMER TOE SURGERY     HERNIA REPAIR     JOINT REPLACEMENT     left hip   KNEE SURGERY     meniscus   RADICAL VAGINAL HYSTERECTOMY  1993   TONSILLECTOMY     TOTAL KNEE ARTHROPLASTY Left 12/20/2014   Procedure: LEFT TOTAL KNEE ARTHROPLASTY;  Surgeon: Melrose Nakayama, MD;  Location: [facility];  Service: Orthopedics;  Laterality: Left;      Current Outpatient Medications:    benazepril (LOTENSIN) 20 MG tablet, TAKE ONE-HALF TABLET BY MOUTH IN THE MORNING AND 1 TABLET BY  MOUTH IN THE EVENING, Disp: 15 tablet, Rfl: 0   Cholecalciferol (VITAMIN D-3) 1000 UNITS CAPS, Take 2 capsules by mouth daily., Disp: , Rfl:    citalopram (CELEXA) 20 MG tablet, TAKE 1 TABLET BY MOUTH DAILY, Disp: 90 tablet, Rfl: 3   levothyroxine (SYNTHROID) 88 MCG tablet, TAKE 1 TABLET BY MOUTH DAILY  BEFORE BREAKFAST, Disp: 100 tablet, Rfl: 2   LORazepam (ATIVAN) 1 MG tablet, TAKE 1 TABLET BY MOUTH AT BEDTIME AS NEEDED FOR ANXIETY, Disp: 30 tablet, Rfl: 0   Selenium (SELENIMIN PO), Take by mouth daily., Disp: , Rfl:    Calcium-Vitamin D-Vitamin K (VIACTIV CALCIUM PLUS D) 650-12.5-40 MG-MCG-MCG CHEW, Chew 1 tablet by mouth daily. (Patient not taking: Reported on 08/05/2022), Disp: , Rfl:    co-enzyme Q-10 50 MG capsule, Take 50 mg by mouth daily. (Patient not taking: Reported on 08/05/2022), Disp: , Rfl:    DIGESTIVE ENZYMES PO, Take by  mouth. (Patient not taking: Reported on 08/05/2022), Disp: , Rfl:    Iodine, Kelp, (KELP PO), Take 1 tablet by mouth daily as needed. (Patient not taking: Reported on 08/05/2022), Disp: , Rfl:    Magnesium 100 MG CAPS, Take by mouth. (Patient not taking: Reported on 08/05/2022), Disp: , Rfl:    Prenatal Vit-Fe Fumarate-FA (PRENATAL VITAMIN AND MINERAL PO), Take 1 tablet by mouth. (Patient not taking: Reported on 08/05/2022), Disp: ,  Rfl:    rosuvastatin (CRESTOR) 5 MG tablet, Take 1 tablet (5 mg total) by mouth daily. (Patient not taking: Reported on 08/05/2022), Disp: 90 tablet, Rfl: 3   UNABLE TO FIND, Olly Skin (Patient not taking: Reported on 08/05/2022), Disp: , Rfl:     Physical Exam: Blood pressure 128/60, pulse 80, height '5\' 5"'$  (1.651 m), weight 143 lb 6.4 oz (65 kg), SpO2 99 %.   Affect appropriate Healthy:  appears stated age 57: normal Neck supple with no adenopathy JVP normal bilateral  bruits no thyromegaly Lungs clear with no wheezing and good diaphragmatic motion Heart:  S1/S2 SEM mild AS  murmur, no rub, gallop or click PMI normal Abdomen: benighn, BS positve, no tenderness, no AAA no bruit.  No HSM or HJR Distal pulses intact  but  No edema Neuro non-focal Skin warm and dry No muscular weakness   Labs:   Lab Results  Component Value Date   WBC 6.4 12/09/2014   HGB 13.9 12/09/2014   HCT 41.9 12/09/2014   MCV 88.2 12/09/2014   PLT 298 12/09/2014   No results for input(s): "NA", "K", "CL", "CO2", "BUN", "CREATININE", "CALCIUM", "PROT", "BILITOT", "ALKPHOS", "ALT", "AST", "GLUCOSE" in the last 168 hours.  Invalid input(s): "LABALBU" Lab Results  Component Value Date   TROPONINI <0.30 02/19/2012    Lab Results  Component Value Date   CHOL 196 01/07/2022   CHOL 151 05/28/2021   CHOL 173 12/26/2020   Lab Results  Component Value Date   HDL 58.50 01/07/2022   HDL 45.60 05/28/2021   HDL 50.80 12/26/2020   Lab Results  Component Value Date    LDLCALC 110 (H) 01/07/2022   LDLCALC 78 05/28/2021   Lab Results  Component Value Date   TRIG 140.0 01/07/2022   TRIG 135.0 05/28/2021   TRIG 201.0 (H) 12/26/2020   Lab Results  Component Value Date   CHOLHDL 3 01/07/2022   CHOLHDL 3 05/28/2021   CHOLHDL 3 12/26/2020   Lab Results  Component Value Date   LDLDIRECT 111.0 12/26/2020      Radiology: No results found.  EKG: See HPI      ASSESSMENT AND PLAN:   Palpitations: benign sounding with unrevealing ECG and HR in normal range TSH fine Hct with primary no need for monitor PVD: no claudication update ABI's moderate reduction on right in 2021 HTN:  continue lotensin Thyroid:  continue synthroid replacement TSH normal HLD:  on low dose Crestor   AVdx:  TTE 2014 questioned bicuspid AV update TTE Bruit:  bilateral carotid vs referred murmur check duplex    Carotid Duplex ABI';s  TTE   F/U PRN pending test results   Signed: Jenkins Rouge 08/05/2022, 12:59 PM

## 2022-07-31 ENCOUNTER — Other Ambulatory Visit: Payer: Self-pay | Admitting: Family Medicine

## 2022-07-31 DIAGNOSIS — F419 Anxiety disorder, unspecified: Secondary | ICD-10-CM

## 2022-08-05 ENCOUNTER — Encounter: Payer: Self-pay | Admitting: Cardiovascular Disease

## 2022-08-05 ENCOUNTER — Ambulatory Visit: Payer: Medicare Other | Attending: Cardiovascular Disease | Admitting: Cardiovascular Disease

## 2022-08-05 VITALS — BP 128/60 | HR 80 | Ht 65.0 in | Wt 143.4 lb

## 2022-08-05 DIAGNOSIS — R0989 Other specified symptoms and signs involving the circulatory and respiratory systems: Secondary | ICD-10-CM

## 2022-08-05 DIAGNOSIS — R002 Palpitations: Secondary | ICD-10-CM | POA: Diagnosis not present

## 2022-08-05 DIAGNOSIS — E782 Mixed hyperlipidemia: Secondary | ICD-10-CM

## 2022-08-05 DIAGNOSIS — I739 Peripheral vascular disease, unspecified: Secondary | ICD-10-CM

## 2022-08-05 DIAGNOSIS — R011 Cardiac murmur, unspecified: Secondary | ICD-10-CM

## 2022-08-05 NOTE — Patient Instructions (Signed)
Medication Instructions:  NO CHANGES *If you need a refill on your cardiac medications before your next appointment, please call your pharmacy*   Lab Work: NONE If you have labs (blood work) drawn today and your tests are completely normal, you will receive your results only by: [place] (if you have MyChart) OR A paper copy in the mail If you have any lab test that is abnormal or we need to change your treatment, we will call you to review the results.   Testing/Procedures: Your physician has requested that you have an echocardiogram. Echocardiography is a painless test that uses sound waves to create images of your heart. It provides your doctor with information about the size and shape of your heart and how well your heart's chambers and valves are working. This procedure takes approximately one hour. There are no restrictions for this procedure. Please do NOT wear cologne, perfume, aftershave, or lotions (deodorant is allowed). Please arrive 15 minutes prior to your appointment time.   Your physician has requested that you have an ankle brachial index (ABI). During this test an ultrasound and blood pressure cuff are used to evaluate the arteries that supply the arms and legs with blood. Allow thirty minutes for this exam. There are no restrictions or special instructions.   Your physician has requested that you have a lower extremity arterial duplex. This test is an ultrasound of the arteries in the legs or arms. It looks at arterial blood flow in the legs and arms. Allow one hour for Lower and Upper Arterial scans. There are no restrictions or special instructions    Your physician has requested that you have a carotid duplex. This test is an ultrasound of the carotid arteries in your neck. It looks at blood flow through these arteries that supply the brain with blood. Allow one hour for this exam. There are no restrictions or special instructions.    Follow-Up: At [facility], you and your health needs are our priority.  As part of our continuing mission to provide you with exceptional heart care, we have created designated Provider Care Teams.  These Care Teams include your primary Cardiologist (physician) and Advanced Practice Providers (APPs -  Physician Assistants and Nurse Practitioners) who all work together to provide you with the care you need, when you need it.  We recommend signing up for the patient portal called "MyChart".  Sign up information is provided on this After Visit Summary.  MyChart is used to connect with patients for Virtual Visits (Telemedicine).  Patients are able to view lab/test results, encounter notes, upcoming appointments, etc.  Non-urgent messages can be sent to your provider as well.   To learn more about what you can do with MyChart, go to [email or web address].    Your next appointment:   AS NEEDED  The format for your next appointment:     Provider:   Other Instructions NONE  Important Information About [place]

## 2022-08-12 ENCOUNTER — Telehealth: Payer: Self-pay | Admitting: Pharmacist
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
## 2022-08-19 ENCOUNTER — Ambulatory Visit (HOSPITAL_BASED_OUTPATIENT_CLINIC_OR_DEPARTMENT_OTHER)
Admission: RE | Admit: 2022-08-19 | Discharge: 2022-08-19 | Disposition: A | Discharge status: Home / Self Care | Payer: Medicare Other | Source: Ambulatory Visit | Attending: Cardiovascular Disease | Admitting: Cardiovascular Disease

## 2022-08-19 ENCOUNTER — Ambulatory Visit (HOSPITAL_COMMUNITY)
Admission: RE | Admit: 2022-08-19 | Discharge: 2022-08-19 | Disposition: A | Discharge status: Home / Self Care | Payer: Medicare Other | Source: Ambulatory Visit | Attending: Cardiology | Admitting: Cardiology

## 2022-08-19 DIAGNOSIS — R0989 Other specified symptoms and signs involving the circulatory and respiratory systems: Secondary | ICD-10-CM | POA: Insufficient documentation

## 2022-08-19 DIAGNOSIS — I739 Peripheral vascular disease, unspecified: Secondary | ICD-10-CM | POA: Diagnosis not present

## 2022-08-28 ENCOUNTER — Ambulatory Visit (HOSPITAL_COMMUNITY): Payer: Medicare Other | Attending: Cardiovascular Disease

## 2022-08-28 DIAGNOSIS — R011 Cardiac murmur, unspecified: Secondary | ICD-10-CM | POA: Diagnosis not present

## 2022-08-28 LAB — ECHOCARDIOGRAM COMPLETE
AR max vel: 2.22 cm2
AV Area VTI: 2.3 cm2
AV Area mean vel: 1.91 cm2
AV Mean grad: 8.5 mmHg
AV Peak grad: 16.3 mmHg
Ao pk vel: 2.02 m/s
Area-P 1/2: 3.15 cm2
S' Lateral: 2.1 cm

## 2022-09-01 ENCOUNTER — Other Ambulatory Visit: Payer: Self-pay | Admitting: Family Medicine

## 2022-09-06 ENCOUNTER — Telehealth: Payer: Self-pay | Admitting: Pharmacist

## 2022-09-06 NOTE — Chronic Care Management (AMB) (Signed)
    Chronic Care Management Pharmacy Assistant   Name: Crystal Levy  MRN: [id number] DOB: 1941-08-20  09/10/2022 APPOINTMENT REMINDER  Crystal Levy was reminded to have all medications, supplements and any blood glucose and blood pressure readings available for review with [name], Pharm. D, at her telephone visit on 09/10/2022 at 12:00.  Care Gaps: AWV - scheduled 02/18/2023 Last BP - 128/60 on 08/05/2022 Tdap - overdue Flu - overdue Covid - overdue  Star Rating Drug: Benazepril 20 mg - last filled 06/28/2022 100 DS at [facility] Rosuvastatin 5 mg - last filled 07/10/2022 100 DS at [facility]  Any gaps in medications fill history? No  Gennie Alma [facility]  Catering manager [phone]

## 2022-09-10 ENCOUNTER — Telehealth: Payer: Self-pay

## 2022-09-10 ENCOUNTER — Telehealth: Payer: Medicare Other

## 2022-09-10 ENCOUNTER — Ambulatory Visit: Payer: Self-pay | Admitting: Pharmacist

## 2022-09-10 ENCOUNTER — Encounter: Payer: Self-pay | Admitting: Pharmacist

## 2022-09-10 DIAGNOSIS — I10 Essential (primary) hypertension: Secondary | ICD-10-CM

## 2022-09-10 DIAGNOSIS — E785 Hyperlipidemia, unspecified: Secondary | ICD-10-CM

## 2022-09-10 MED ORDER — VITAMIN B12 500 MCG PO TABS: 1.0000 | ORAL_TABLET | Freq: Every day | ORAL | 3 refills | Status: DC
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
## 2022-09-10 NOTE — Telephone Encounter (Signed)
-----   Message from Viona Gilmore, [facility] sent at 09/10/2022 12:45 PM EST ----- Regarding: Chart updates Hi,  Somehow I have lost the ability to add vaccines and update the medication list for patients. Can you please add vitamin B12 500 mcg daily to her list of meds and remove the magnesium? Also, can you add that she got Arexvy on 05/15/22 with lot: [id number]?  Thank you! Maddie

## 2022-09-10 NOTE — Patient Instructions (Signed)
Hi [place],  It was great to catch up again!   Please reach out to me if you have any questions or need anything!  Don't forget to restart the calcium as we discussed.  Best, Maddie  [name], PharmD, [facility] [place]    Visit Information   Goals Addressed   None    Patient Care Plan: CCM Pharmacy Care Plan     Problem Identified: Problem: Hypertension, Hyperlipidemia, GERD, Hypothyroidism, Anxiety, Osteoporosis, Osteoarthritis, and Insomnia      Long-Range Goal: Patient-Specific Goal   Start Date: 03/02/2021  Expected End Date: 03/02/2022  Recent Progress: On track  Priority: High  Note:   Current Barriers:  Unable to independently monitor therapeutic efficacy Unable to achieve control of cholesterol  Unable to maintain control of blood pressure  Pharmacist Clinical Goal(s):  Patient will achieve adherence to monitoring guidelines and medication adherence to achieve therapeutic efficacy achieve control of cholesterol as evidenced by next lipid panel maintain control of blood pressure as evidenced by home and office BP readings  through collaboration with PharmD and provider.   Interventions: 1:1 collaboration with [place], Betty G, MD regarding development and update of comprehensive plan of care as evidenced by provider attestation and co-signature Inter-disciplinary care team collaboration (see longitudinal plan of care) Comprehensive medication review performed; medication list updated in electronic medical record  Hypertension (BP goal <140/90) -Controlled -Current treatment: Benazepril 20 mg 1/2 tablet in the morning and 1 tablet at bedtime - Appropriate, Effective, Safe, Accessible -Medications previously tried: none  -Current home readings: 130/70, 128/67, 120/68; Dec 26th had a high of 157/65, 156/81; (checking every other day - didn't check device at home)  -Current dietary habits: eating  leaner meat and fruits and vegetables -Current exercise habits: walking every day -Denies hypotensive/hypertensive symptoms -Educated on BP goals and benefits of medications for prevention of heart attack, stroke and kidney damage; Exercise goal of 150 minutes per week; Importance of home blood pressure monitoring; Proper BP monitoring technique; -Counseled to monitor BP at home weekly, document, and provide log at future appointments -Counseled on diet and exercise extensively Recommended to continue current medication  Hyperlipidemia: (LDL goal < 70) -Uncontrolled -Current treatment: Rosuvastatin 5 mg 1 tablet daily - not taking -Medications previously tried: none  -Current dietary patterns: not eating fried foods and uses olive oil when cooking; drinks 1 glass of wine 4 nights a week; eating a lot more simple foods -Current exercise habits: walking; going to a chiropractor -Educated on Cholesterol goals;  Benefits of statin for ASCVD risk reduction; Importance of limiting foods high in cholesterol; Exercise goal of 150 minutes per week; -Counseled on diet and exercise extensively Recommended to continue current medication Recommended trial of alternative statin such as pravastatin for LDL lowering.  PAD (Goal: prevent heart events and improve circulation) -Controlled -Current treatment  Rosuvastatin 5 mg 1 tablet daily - Appropriate, Effective, Safe, Accessible -Medications previously tried: aspirin (bruising) -Recommended to continue current medication Counseled on benefits of taking aspirin given PAD indication to prevent heart events.  Depression/Anxiety/Insomnia (Goal: minimize symptoms and improve sleep) -Not ideally controlled -Current treatment: Citalopram 20 mg 1 tablet daily - Appropriate, Effective, Safe, Accessible Lorazepam 1 mg 1 tablet at bedtime as needed - Appropriate, Effective, Query Safe, Accessible -Medications previously tried/failed: none -PHQ9:  8 -GAD7: 7 -Educated on Benefits of medication for symptom control -Counseled on risk of falls with use of lorazepam Recommended discussion with PCP about alternative agents for sleep.  Osteopenia (Goal prevent fractures) -Controlled -Last DEXA Scan: 11/17/20  T-Score femoral neck: -2.1  T-Score total hip: n/a  T-Score lumbar spine: 0.7  T-Score forearm radius: -1.5  10-year probability of major osteoporotic fracture: 16  10-year probability of hip fracture: 4.6% -Patient is not a candidate for pharmacologic treatment -Current treatment  Calcium-vit D-vit K 650-12.5 mcg 1 chew daily - Appropriate, Effective, Safe, Accessible Vitamin D 1000 units 2 capsules daily - Appropriate, Effective, Safe, Accessible -Medications previously tried: Alendronate (side effects) -Recommend [phone] units of vitamin D daily. Recommend 1200 mg of calcium daily from dietary and supplemental sources. Recommend weight-bearing and muscle strengthening exercises for building and maintaining bone density. -Counseled on diet and exercise extensively Recommended to continue current medication  Hypothyroidism (Goal: TSH 2.5-4.5 based on osteoporosis) -Controlled -Current treatment  Levothyroxine 88 mcg 1 tablet daily before breakfast - Appropriate, Effective, Safe, Accessible -Medications previously tried: none  -Recommended to continue current medication  GERD (Goal: minimize symptoms) -Controlled -Current treatment  No medications -Medications previously tried: pantoprazole -Counseled on diet and exercise extensively   Health Maintenance -Vaccine gaps: tetanus -Current therapy:  Coenzyme Q10 50 mg 1 capsule daily Digestive enzymes Magnesium 100 mg 1 capsule daily Multivitamin 1 tablet daily Potassium gluconate 595 mg 1 tablet daily Selenium daily Adrenal support daily CV respiratory/lymphatic support daily Milk of magnesia as needed Biotin 1000 mcg 1 tablet daily Vitamin B complex  daily Alpha lipoic acid 100 mg 1 tablet daily Olly skin - hyaluronic acid Circularity supplement daily -Educated on Herbal supplement research is limited and benefits usually cannot be proven Cost vs benefit of each product must be carefully weighed by individual consumer Supplements may interfere with prescription drugs -Patient is satisfied with current therapy and denies issues -Recommended to continue current medication  Patient Goals/Self-Care Activities Patient will:  - take medications as prescribed check blood pressure weekly, document, and provide at future appointments target a minimum of 150 minutes of moderate intensity exercise weekly  Follow Up Plan: Telephone follow up appointment with care management team member scheduled for: 6 months       Patient verbalizes understanding of instructions and care plan provided today and agrees to view in [place]. Active MyChart status and patient understanding of how to access instructions and care plan via MyChart confirmed with patient.    The pharmacy team will reach out to the patient again over the next 90 days.   Viona Gilmore, [facility]

## 2022-09-10 NOTE — Telephone Encounter (Signed)
This encounter was created in error - please disregard.

## 2022-10-24 DIAGNOSIS — H43813 Vitreous degeneration, bilateral: Secondary | ICD-10-CM | POA: Diagnosis not present

## 2022-10-24 DIAGNOSIS — H16223 Keratoconjunctivitis sicca, not specified as Sjogren's, bilateral: Secondary | ICD-10-CM | POA: Diagnosis not present

## 2022-11-01 NOTE — Progress Notes (Unsigned)
ACUTE VISIT No chief complaint on file.  HPI: [name] is a 82 y.o. female, who is here today complaining of *** HPI  Review of Systems See other pertinent positives and negatives in HPI.  Current Outpatient Medications on File Prior to Visit  Medication Sig Dispense Refill   benazepril (LOTENSIN) 20 MG tablet TAKE ONE-HALF TABLET BY MOUTH IN THE MORNING AND 1 TABLET BY  MOUTH IN THE EVENING 150 tablet 1   Cholecalciferol (VITAMIN D-3) 1000 UNITS CAPS Take 2 capsules by mouth daily.     citalopram (CELEXA) 20 MG tablet TAKE 1 TABLET BY MOUTH DAILY 90 tablet 3   Cyanocobalamin (VITAMIN B12) 500 MCG TABS Take 1 tablet by mouth daily. 30 tablet 3   levothyroxine (SYNTHROID) 88 MCG tablet TAKE 1 TABLET BY MOUTH DAILY  BEFORE BREAKFAST 100 tablet 2   LORazepam (ATIVAN) 1 MG tablet TAKE 1 TABLET BY MOUTH AT BEDTIME AS NEEDED FOR ANXIETY 30 tablet 0   Selenium (SELENIMIN PO) Take by mouth daily.     No current facility-administered medications on file prior to visit.    Past Medical History:  Diagnosis Date   Anxiety    Arthritis    Bicuspid aortic valve    Depression    Hypertension    Hypothyroidism    Polio    Thyroid disease    Allergies  Allergen Reactions   Clindamycin/Lincomycin Diarrhea and Other (See Comments)    C-Diff   Fluticasone     Headache and nosebleed    Azithromycin    Eggs Or Egg-Derived Products    Milk-Related Compounds     Unknown reaction   Penicillins Rash   Sulfa Antibiotics Diarrhea   Sulfamethoxazole-Trimethoprim    Fenofibrate Rash    Social History   Socioeconomic History   Marital status: Divorced    Spouse name: Not on file   Number of children: 2   Years of education: Not on file   Highest education level: Master's degree (e.g., MA, MS, MEng, MEd, MSW, MBA)  Occupational History    Employer: [employer]  Tobacco Use   Smoking status: Never   Smokeless tobacco: Never   Tobacco comments:    Rare smoker  in the distant past.   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    Social Determinants of Health   Financial Resource Strain: Low Risk  (03/05/2022)   Overall Financial Resource Strain (CARDIA)    Difficulty of Paying Living Expenses: Not hard at all  Recent Concern: Financial Resource Strain - Medium Risk (01/03/2022)   Overall Financial Resource Strain (CARDIA)    Difficulty of Paying Living Expenses: Somewhat hard  Food Insecurity: No Food Insecurity (02/15/2022)   Hunger Vital Sign    Worried About Running Out of Food in the Last Year: Never true    [place] in the Last Year: Never true  Transportation Needs: No Transportation Needs (02/15/2022)   PRAPARE - Hydrologist (Medical): No    Lack of Transportation (Non-Medical): No  Physical Activity: Sufficiently Active (02/15/2022)   Exercise Vital Sign    Days of Exercise per Week: 7 days  Minutes of Exercise per Session: 30 min  Recent Concern: Physical Activity - Insufficiently Active (01/03/2022)   Exercise Vital Sign    Days of Exercise per Week: 5 days    Minutes of Exercise per Session: 20 min  Stress: No Stress Concern Present (02/15/2022)   [place]    Feeling of Stress : Not at all  Recent Concern: Stress - Stress Concern Present (01/03/2022)   [place]    Feeling of Stress : To some extent  Social Connections: Socially Isolated (02/15/2022)   Social Connection and Isolation Panel [NHANES]    Frequency of Communication with Friends and Family: More than three times a week    Frequency of Social Gatherings with Friends and  Family: More than three times a week    Attends Religious Services: Never    Marine scientist or Organizations: No    Attends Archivist Meetings: Never    Marital Status: Divorced    There were no vitals filed for this visit. There is no height or weight on file to calculate BMI.  Physical Exam  ASSESSMENT AND PLAN: There are no diagnoses linked to this encounter.  No follow-ups on file.  Crystal Capelle G. [place], MD  [facility]. [place] office.  Discharge Instructions   None

## 2022-11-04 ENCOUNTER — Ambulatory Visit (INDEPENDENT_AMBULATORY_CARE_PROVIDER_SITE_OTHER): Payer: Medicare Other | Admitting: Family Medicine

## 2022-11-04 ENCOUNTER — Encounter: Payer: Self-pay | Admitting: Family Medicine

## 2022-11-04 VITALS — BP 120/70 | HR 76 | Resp 16 | Ht 65.0 in | Wt 143.1 lb

## 2022-11-04 DIAGNOSIS — E785 Hyperlipidemia, unspecified: Secondary | ICD-10-CM | POA: Diagnosis not present

## 2022-11-04 DIAGNOSIS — E039 Hypothyroidism, unspecified: Secondary | ICD-10-CM | POA: Diagnosis not present

## 2022-11-04 DIAGNOSIS — K623 Rectal prolapse: Secondary | ICD-10-CM | POA: Diagnosis not present

## 2022-11-04 DIAGNOSIS — F419 Anxiety disorder, unspecified: Secondary | ICD-10-CM

## 2022-11-04 DIAGNOSIS — K582 Mixed irritable bowel syndrome: Secondary | ICD-10-CM

## 2022-11-04 DIAGNOSIS — I739 Peripheral vascular disease, unspecified: Secondary | ICD-10-CM | POA: Diagnosis not present

## 2022-11-04 NOTE — Patient Instructions (Addendum)
A few things to remember from today's visit:  Irritable bowel syndrome with both constipation and diarrhea  Anxiety disorder, unspecified type  Rectal prolapse - Plan: Ambulatory referral to General Surgery, CANCELED: Ambulatory referral to General Surgery  Peripheral vascular disease, unspecified ([place]), Chronic  Hypothyroidism (acquired)  Miralax 1/2-1 of the dose daily as needed.  If you need refills for medications you take chronically, please call your pharmacy. Do not use My Chart to request refills or for acute issues that need immediate attention. If you send a my chart message, it may take a few days to be addressed, specially if I am not in the office.  Please be sure medication list is accurate. If a new problem present, please set up appointment sooner than planned today.

## 2022-11-04 NOTE — Assessment & Plan Note (Signed)
Problem is stable. Continue Lorazepam 1 mg daily prn and Celexa 20 mg daily. F/U in 3 months.

## 2022-11-04 NOTE — Assessment & Plan Note (Signed)
We reviewed thyroid US. Last TSH 2.2 in 01/2022, she prefers to hold on labs until next visit.

## 2022-11-04 NOTE — Assessment & Plan Note (Addendum)
LDL 110 in 01/2022. She has not tolerated Rosuvastatin 5 mg daily, it caused headache. She agrees with taking medication 3 times per week.

## 2022-11-04 NOTE — Assessment & Plan Note (Addendum)
Continue adequate fiber and fluid intake. Miralax 1/2 or whole dose as tolerated daily or every other day for constipation. Avoid prolonged toilet time and straining.

## 2022-11-05 NOTE — Assessment & Plan Note (Signed)
She will try Rosuvastatin 5 mg 3 times per week. Continue low fat diet. Will plan on checking FLP next visit.

## 2022-11-05 NOTE — Assessment & Plan Note (Signed)
We discussed Dx,prognosis,and treatment options. Problem has been going on for years and getting worse. She agrees with arranging consultation with general surgeon. Instructed about warnings signs.

## 2022-11-08 ENCOUNTER — Telehealth: Payer: Self-pay | Admitting: Family Medicine

## 2022-11-08 NOTE — Telephone Encounter (Signed)
Patient asking if a surgeon was contacted

## 2022-11-26 NOTE — Telephone Encounter (Signed)
Pt called to ask if [facility] has approved the surgery yet?  Pt states she really needs to know something, anything.  Pt is asking for a call back either way.

## 2022-11-26 NOTE — Telephone Encounter (Signed)
Can you check on the status of her general surgery referral?

## 2022-11-26 NOTE — Telephone Encounter (Signed)
I spoke to [facility], they will move the referral over and call patient to get her scheduled. I called patient and left her a message letting her know to be on the lookout for a call from them.

## 2022-11-27 DIAGNOSIS — M25511 Pain in right shoulder: Secondary | ICD-10-CM | POA: Diagnosis not present

## 2022-11-27 DIAGNOSIS — M25562 Pain in left knee: Secondary | ICD-10-CM | POA: Diagnosis not present

## 2022-11-29 ENCOUNTER — Encounter: Payer: Self-pay | Admitting: Family Medicine

## 2022-11-29 ENCOUNTER — Ambulatory Visit (INDEPENDENT_AMBULATORY_CARE_PROVIDER_SITE_OTHER): Payer: Medicare Other | Admitting: Family Medicine

## 2022-11-29 VITALS — BP 128/70 | HR 80 | Resp 16 | Ht 65.0 in | Wt 139.5 lb

## 2022-11-29 DIAGNOSIS — D1730 Benign lipomatous neoplasm of skin and subcutaneous tissue of unspecified sites: Secondary | ICD-10-CM

## 2022-11-29 DIAGNOSIS — F419 Anxiety disorder, unspecified: Secondary | ICD-10-CM | POA: Diagnosis not present

## 2022-11-29 DIAGNOSIS — R1032 Left lower quadrant pain: Secondary | ICD-10-CM | POA: Diagnosis not present

## 2022-11-29 DIAGNOSIS — R1031 Right lower quadrant pain: Secondary | ICD-10-CM | POA: Diagnosis not present

## 2022-11-29 NOTE — Progress Notes (Signed)
ACUTE VISIT Chief Complaint  Patient presents with  . Abdominal Pain  . spots on chest   HPI: [name] is a 82 y.o. female with past medical history significant for anxiety, hypertension, PAD,and OA here today complaining of bilateral inguinal/groin pain, R>L, and "spots" on her chest skin.  Inguinal pain, which has been persistent for a couple of weeks, is particularly noted in the right inguinal area. She suspects this may be related to a hernia she had repaired  in 2014.Right-sided pain is radiating down the right inner thigh. Pain is characterized as constant, dull, and cramp at times. She denies experiencing associated fever, appetite changes,changes in bowel habits, nausea,vomiting,urinary symptoms, vaginal bleeding, or discharge. S/P hysterectomy. She has an appointment with general surgeon on 12/16/2022 to address rectal prolapse.  Additionally, she reports noticing lesions under skin, left upper chest,infraclavicular, she found when palpating area about a week ago. She describes it as small pearly bumps, with no associated pain or pruritus reported. She has not noted skin changes. She follows with dermatology annually, Dr. Ouida Sills.  She also mentions feeling generally unwell over the past few weeks,she can specified symptoms, feels fatigue and experiencing both physical and mental discomfort. She has had a "dripping nose" since September,  but no other new symptoms have been noted. Frustrated with recent health issues. She has seen ortho and received a right shoulder injection. Anxiety on Celexa 20 mg daily and lorazepam 1 mg daily at bedtime.  Review of Systems  Constitutional:  Positive for fatigue. Negative for chills.  HENT:  Negative for mouth sores and sore throat.   Respiratory:  Negative for cough, shortness of breath and wheezing.   Cardiovascular:  Negative for chest pain and palpitations.  Endocrine: Negative for cold intolerance and heat intolerance.   Genitourinary:  Negative for decreased urine volume, dysuria and hematuria.  Musculoskeletal:  Positive for arthralgias and gait problem.  Psychiatric/Behavioral:  Negative for confusion. The patient is nervous/anxious.   See other pertinent positives and negatives in HPI.  Current Outpatient Medications on File Prior to Visit  Medication Sig Dispense Refill  . benazepril (LOTENSIN) 20 MG tablet TAKE ONE-HALF TABLET BY MOUTH IN THE MORNING AND 1 TABLET BY  MOUTH IN THE EVENING 150 tablet 1  . Cholecalciferol (VITAMIN D-3) 1000 UNITS CAPS Take 2 capsules by mouth daily.    . citalopram (CELEXA) 20 MG tablet TAKE 1 TABLET BY MOUTH DAILY 90 tablet 3  . Cyanocobalamin (VITAMIN B12) 500 MCG TABS Take 1 tablet by mouth daily. 30 tablet 3  . levothyroxine (SYNTHROID) 88 MCG tablet TAKE 1 TABLET BY MOUTH DAILY  BEFORE BREAKFAST 100 tablet 2  . LORazepam (ATIVAN) 1 MG tablet TAKE 1 TABLET BY MOUTH AT BEDTIME AS NEEDED FOR ANXIETY 30 tablet 0  . Selenium (SELENIMIN PO) Take by mouth daily.     No current facility-administered medications on file prior to visit.   Past Medical History:  Diagnosis Date  . Anxiety   . Arthritis   . Bicuspid aortic valve   . Depression   . Hypertension   . Hypothyroidism   . Polio   . Thyroid disease    Allergies  Allergen Reactions  . Clindamycin/Lincomycin Diarrhea and Other (See Comments)    C-Diff  . Fluticasone     Headache and nosebleed   . Azithromycin   . Egg-Derived Products   . Milk-Related Compounds     Unknown reaction  . Penicillins Rash  . Sulfa Antibiotics Diarrhea  .  Sulfamethoxazole-Trimethoprim   . Fenofibrate Rash   Social History   Socioeconomic History  . Marital status: Divorced    Spouse name: Not on file  . Number of children: 2  . Years of education: Not on file  . Highest education level: Master's degree (e.g., MA, MS, MEng, MEd, MSW, MBA)  Occupational History    Employer: [employer]  Tobacco Use  .  Smoking status: Never  . Smokeless tobacco: Never  . Tobacco comments:    Rare smoker in the distant past.   Substance and Sexual Activity  . Alcohol use: Yes    Alcohol/week: 0.0 standard drinks of alcohol    Comment: wine  . Drug use: No  . Sexual activity: Not on file  Other Topics Concern  . 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    Social Determinants of Health   Financial Resource Strain: Low Risk  (03/05/2022)   Overall Financial Resource Strain (CARDIA)   . Difficulty of Paying Living Expenses: Not hard at all  Recent Concern: Financial Resource Strain - Medium Risk (01/03/2022)   Overall Financial Resource Strain (CARDIA)   . Difficulty of Paying Living Expenses: Somewhat hard  Food Insecurity: No Food Insecurity (02/15/2022)   Hunger Vital Sign   . Worried About Charity fundraiser in the Last Year: Never true   . Ran Out of Food in the Last Year: Never true  Transportation Needs: No Transportation Needs (02/15/2022)   PRAPARE - Transportation   . Lack of Transportation (Medical): No   . Lack of Transportation (Non-Medical): No  Physical Activity: Sufficiently Active (02/15/2022)   Exercise Vital Sign   . Days of Exercise per Week: 7 days   . Minutes of Exercise per Session: 30 min  Recent Concern: Physical Activity - Insufficiently Active (01/03/2022)   Exercise Vital Sign   . Days of Exercise per Week: 5 days   . Minutes of Exercise per Session: 20 min  Stress: No Stress Concern Present (02/15/2022)   [place]   . Feeling of Stress : Not at all  Recent Concern: Stress - Stress Concern Present (01/03/2022)   [place]   . Feeling of Stress : To some extent  Social Connections: Socially Isolated (02/15/2022)   Social Connection and Isolation Panel [NHANES]   . Frequency of Communication with  Friends and Family: More than three times a week   . Frequency of Social Gatherings with Friends and Family: More than three times a week   . Attends Religious Services: Never   . Active Member of Clubs or Organizations: No   . Attends Archivist Meetings: Never   . Marital Status: Divorced   Vitals:   11/29/22 1540  BP: 128/70  Pulse: 80  Resp: 16  SpO2: 97%   Wt Readings from Last 3 Encounters:  11/29/22 139 lb 8 oz (63.3 kg)  11/04/22 143 lb 2 oz (64.9 kg)  08/05/22 143 lb 6.4 oz (65 kg)   Body mass index is 23.21 kg/m.  Physical Exam Vitals and nursing note reviewed.  Constitutional:      General: She is not in acute distress.    Appearance: She is well-developed.  HENT:     Head: Normocephalic and atraumatic.  Eyes:     Conjunctiva/sclera: Conjunctivae normal.  Cardiovascular:     Rate and Rhythm: Normal  rate and regular rhythm.     Heart sounds: Murmur (SEM I/VI RUSB) heard.  Pulmonary:     Effort: Pulmonary effort is normal. No respiratory distress.     Breath sounds: Normal breath sounds.  Abdominal:     Palpations: Abdomen is soft. There is no hepatomegaly or mass.     Tenderness: There is no abdominal tenderness.     Comments: Inguinal area protrude with valsalva but no hernia defect appreciated. Right inguinal tenderness with deep palpation, I did not noted masses or adenopathy.  Skin:    General: Skin is warm.     Findings: No erythema or rash.       Neurological:     Mental Status: She is alert and oriented to person, place, and time.     Cranial Nerves: No cranial nerve deficit.     Comments: Right LE weakness, it is at her baseline.  Antalgic gait, not assisted.  Psychiatric:        Mood and Affect: Affect normal. Mood is anxious.  ASSESSMENT AND PLAN:  Ms. Korba was seen today for inguinal pain and skin lesions.  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  Geordie Nooney G. [place], MD  [facility]. [place] office.

## 2022-11-29 NOTE — Patient Instructions (Signed)
A few things to remember from today's visit:  Bilateral groin pain  Benign lipomatous neoplasm of skin and subcutaneous tissue  Skin lesion do not seem to be worrisome, continue monitoring for new symptoms and if skin changes or pain, arrange an appt with your dermatologist.  In regard to groin pain I did not feel a hernia. ? Tendinitis. Discuss it with general surgeon to have it check again. Monitor for new symptoms.  If you need refills for medications you take chronically, please call your pharmacy. Do not use My Chart to request refills or for acute issues that need immediate attention. If you send a my chart message, it may take a few days to be addressed, specially if I am not in the office.  Please be sure medication list is accurate. If a new problem present, please set up appointment sooner than planned today.

## 2022-11-30 NOTE — Assessment & Plan Note (Signed)
Stable. Continue Celexa 20 mg daily and lorazepam 1 mg daily at bedtime.

## 2022-12-09 ENCOUNTER — Other Ambulatory Visit: Payer: Self-pay | Admitting: Family Medicine

## 2022-12-09 DIAGNOSIS — F419 Anxiety disorder, unspecified: Secondary | ICD-10-CM

## 2022-12-09 NOTE — Telephone Encounter (Signed)
Last OV 11/29/22 Last filled 11/11/22

## 2022-12-12 DIAGNOSIS — M7632 Iliotibial band syndrome, left leg: Secondary | ICD-10-CM | POA: Diagnosis not present

## 2022-12-12 DIAGNOSIS — S76012D Strain of muscle, fascia and tendon of left hip, subsequent encounter: Secondary | ICD-10-CM | POA: Diagnosis not present

## 2022-12-17 ENCOUNTER — Other Ambulatory Visit: Payer: Self-pay | Admitting: Family Medicine

## 2022-12-23 DIAGNOSIS — M7632 Iliotibial band syndrome, left leg: Secondary | ICD-10-CM | POA: Diagnosis not present

## 2022-12-23 DIAGNOSIS — S76012D Strain of muscle, fascia and tendon of left hip, subsequent encounter: Secondary | ICD-10-CM | POA: Diagnosis not present

## 2022-12-24 ENCOUNTER — Ambulatory Visit: Payer: Self-pay | Admitting: General Surgery

## 2022-12-24 DIAGNOSIS — K623 Rectal prolapse: Secondary | ICD-10-CM | POA: Diagnosis not present

## 2022-12-24 NOTE — H&P (Signed)
REFERRING PHYSICIAN:  Swaziland, Betty G, MD  PROVIDER:  Elenora Gamma, MD  MRN: [id number] DOB: 01-01-1941 DATE OF ENCOUNTER: 12/24/2022  Subjective  Chief Complaint: New Consultation ( Rectal prolapse,)     History of Present Illness: Crystal Levy is a 82 y.o. female who is seen today as an office consultation at the request of Dr. Swaziland for evaluation of New Consultation ( Rectal prolapse,) .  She states that for the past 6 to 8 months she has had worsening problems with rectal prolapse.  This used to occur occasionally but now it occurs almost every day.  It is worse when she is walking, she does for exercise every day.  He is having mucosal drainage.  Patient states she has suffered from constipation her whole life.  She controls this with a high-fiber diet and small doses of MiraLAX daily.  Patient has polio with some right-sided muscle weakness.  Last colonoscopy was approximately 4 years ago at [facility].  It was normal except for some diverticulosis and a cecal polyp.   Review of Systems: A complete review of systems was obtained from the patient.  I have reviewed this information and discussed as appropriate with the patient.  See HPI as well for other ROS.    Medical History: Past Medical History: Diagnosis Date  Anxiety   Hyperlipidemia   Hypertension   Thyroid disease    There is no problem list on file for this patient.   Past Surgical History: Procedure Laterality Date  Cardiac catheterization    CATARACT EXTRACTION    HERNIA REPAIR    HYSTERECTOMY    JOINT REPLACEMENT    TONSILLECTOMY      Allergies Allergen Reactions  Fenofibrate Itching and Rash  Fluticasone Other (See Comments)   Headache and nosebleed  Azithromycin Other (See Comments)  Clindamycin Hcl Other (See Comments)  Penicillins Rash  Sulfa (Sulfonamide Antibiotics) Diarrhea   Current Outpatient Medications on File Prior to Visit Medication Sig Dispense Refill  benazepriL  (LOTENSIN) 20 MG tablet TAKE ONE-HALF TABLET BY MOUTH IN THE MORNING AND 1 TABLET BY  MOUTH IN THE EVENING    calcium carbonate 600 mg calcium (1,500 mg) Tab tablet Take 600 mg by mouth 2 (two) times daily with meals    cholecalciferol (VITAMIN D3) 1000 unit capsule Take 2 capsules by mouth once daily    citalopram (CELEXA) 20 MG tablet Take 1 tablet by mouth once daily    levothyroxine (SYNTHROID) 88 MCG tablet Take by mouth    LORazepam (ATIVAN) 1 MG tablet TAKE 1 TABLET BY MOUTH AT BEDTIME AS NEEDED FOR ANXIETY    rosuvastatin (CRESTOR) 5 MG tablet Take by mouth    No current facility-administered medications on file prior to visit.   Family History Problem Relation Age of Onset  Coronary Artery Disease (Blocked arteries around heart) Mother   High blood pressure (Hypertension) Brother     Social History  Tobacco Use Smoking Status Never Smokeless Tobacco Never    Social History  Socioeconomic History  Marital status: Divorced Tobacco Use  Smoking status: Never  Smokeless tobacco: Never Substance and Sexual Activity  Alcohol use: Not Currently   Alcohol/week: 1.0 standard drink of alcohol   Types: 1 Glasses of wine per week  Drug use: Never  Social Determinants of Health  Financial Resource Strain: Low Risk  (03/05/2022)  Received from [facility], [place]  Overall Financial Resource Strain (CARDIA)   Difficulty of Paying Living Expenses: Not hard at  all Food Insecurity: No Food Insecurity (02/15/2022)  Received from [facility], [place]  Hunger Vital Sign   Worried About Running Out of Food in the Last Year: Never true   Ran Out of Food in the Last Year: Never true Transportation Needs: No Transportation Needs (02/15/2022)  Received from [facility], [place]  [facility] - Transportation   Lack of Transportation (Medical): No   Lack of Transportation (Non-Medical): No Physical Activity: Sufficiently Active (02/15/2022)  [facility]  Exercise Vital Sign   Days of Exercise per Week: 7 days   Minutes of Exercise per Session: 30 min Stress: No Stress Concern Present (02/15/2022)  Received from [facility], [facility]  [facility] of Occupational Health - Occupational Stress Questionnaire   Feeling of Stress : Not at all Social Connections: Socially Isolated (02/15/2022)  Received from [facility], [place]  Social Connection and Isolation Panel [NHANES]   Frequency of Communication with Friends and Family: More than three times a week   Frequency of Social Gatherings with Friends and Family: More than three times a week   Attends Religious Services: Never   Database administrator or Organizations: No   Attends Banker Meetings: Never   Marital Status: Divorced   Objective:   Vitals:  12/24/22 1026 BP: (!) 159/71 Pulse: 84 Temp: 37.2 C (99 F) SpO2: 97% Weight: 64 kg (141 lb) Height: 165.1 cm ( )    Exam Gen: NAD Abd: soft Rectal: Minimal anal gape.  Moderate rectal tone.  No anal rectal masses noted minimal squeeze pressure noted   Labs, Imaging and Diagnostic Testing:  Procedure: Anoscopy Surgeon: Maisie Fus After the risks and benefits were explained, written consent was obtained for above procedure.  A medical assistant chaperone was present thoroughout the entire procedure.  Anesthesia: none Diagnosis: rectal prolapse Findings: ~6cm complete rectal prolapse  Assessment and Plan: Diagnoses and all orders for this visit:  Rectal prolapse  82 year old female with significant rectal prolapse.  She is still very active and this is precluding her quality of life.  I have recommended proceeding with robotic assisted rectopexy.  We have discussed this in detail including alternative treatments.  We discussed risk of the procedure including ongoing or worsening constipation, difficulty with bladder function, bleeding, infection and the small chance of recurrence.  I believe  the benefits of the surgery outweigh the risks.  I have recommended proceeding.  Patient agrees.  All questions were answered.   Vanita Panda, MD Colon and Rectal Surgery [facility]

## 2022-12-25 DIAGNOSIS — M7632 Iliotibial band syndrome, left leg: Secondary | ICD-10-CM | POA: Diagnosis not present

## 2022-12-25 DIAGNOSIS — S76012D Strain of muscle, fascia and tendon of left hip, subsequent encounter: Secondary | ICD-10-CM | POA: Diagnosis not present

## 2023-01-01 DIAGNOSIS — M7632 Iliotibial band syndrome, left leg: Secondary | ICD-10-CM | POA: Diagnosis not present

## 2023-01-01 DIAGNOSIS — S76012D Strain of muscle, fascia and tendon of left hip, subsequent encounter: Secondary | ICD-10-CM | POA: Diagnosis not present

## 2023-01-03 ENCOUNTER — Other Ambulatory Visit: Payer: Self-pay | Admitting: Family Medicine

## 2023-01-03 DIAGNOSIS — S76012D Strain of muscle, fascia and tendon of left hip, subsequent encounter: Secondary | ICD-10-CM | POA: Diagnosis not present

## 2023-01-03 DIAGNOSIS — M7632 Iliotibial band syndrome, left leg: Secondary | ICD-10-CM | POA: Diagnosis not present

## 2023-01-06 DIAGNOSIS — M7632 Iliotibial band syndrome, left leg: Secondary | ICD-10-CM | POA: Diagnosis not present

## 2023-01-06 DIAGNOSIS — S76012D Strain of muscle, fascia and tendon of left hip, subsequent encounter: Secondary | ICD-10-CM | POA: Diagnosis not present

## 2023-01-10 DIAGNOSIS — S76012D Strain of muscle, fascia and tendon of left hip, subsequent encounter: Secondary | ICD-10-CM | POA: Diagnosis not present

## 2023-01-10 DIAGNOSIS — M7632 Iliotibial band syndrome, left leg: Secondary | ICD-10-CM | POA: Diagnosis not present

## 2023-01-13 DIAGNOSIS — M7632 Iliotibial band syndrome, left leg: Secondary | ICD-10-CM | POA: Diagnosis not present

## 2023-01-13 DIAGNOSIS — S76012D Strain of muscle, fascia and tendon of left hip, subsequent encounter: Secondary | ICD-10-CM | POA: Diagnosis not present

## 2023-01-13 NOTE — Patient Instructions (Signed)
DUE TO COVID-19 ONLY TWO VISITORS  (aged 82 and older)  ARE ALLOWED TO COME WITH YOU AND STAY IN THE WAITING ROOM ONLY DURING PRE OP AND PROCEDURE.   **NO VISITORS ARE ALLOWED IN THE SHORT STAY AREA OR RECOVERY ROOM!!**  IF YOU WILL BE ADMITTED INTO THE HOSPITAL YOU ARE ALLOWED ONLY FOUR SUPPORT PEOPLE DURING VISITATION HOURS ONLY (7 AM -8PM)   The support person(s) must pass our screening, gel in and out, and wear a mask at all times, including in the patient's room. Patients must also wear a mask when staff or their support person are in the room. Visitors GUEST BADGE MUST BE WORN VISIBLY  One adult visitor may remain with you overnight and MUST be in the room by 8 P.M.     Your procedure is scheduled on: 01/29/23   Report to [facility] Main Entrance    Report to admitting at : 9:45 AM   Call this number if you have problems the morning of surgery [phone]   Do not eat food :After Midnight.   After Midnight you may have the following liquids until: 9:00 AM DAY OF SURGERY  Water Black Coffee (sugar ok, NO MILK/CREAM OR CREAMERS)  Tea (sugar ok, NO MILK/CREAM OR CREAMERS) regular and decaf                             Plain Jell-O (NO RED)                                           Fruit ices (not with fruit pulp, NO RED)                                     Popsicles (NO RED)                                                                  Juice: apple, WHITE grape, WHITE cranberry Sports drinks like Gatorade (NO RED)              FOLLOW BOWEL PREP AND ANY ADDITIONAL PRE OP INSTRUCTIONS YOU RECEIVED FROM YOUR SURGEON'S OFFICE!!!   Oral Hygiene is also important to reduce your risk of infection.                                    Remember - BRUSH YOUR TEETH THE MORNING OF SURGERY WITH YOUR REGULAR TOOTHPASTE  DENTURES WILL BE REMOVED PRIOR TO SURGERY PLEASE DO NOT APPLY "Poly grip" OR ADHESIVES!!!   Do NOT smoke after Midnight   Take these medicines the morning of  surgery with A SIP OF WATER: citalopram,levothyroxine.                              You may not have any metal on your body including hair pins, jewelry, and body piercing  Do not wear make-up, lotions, powders, perfumes/cologne, or deodorant  Do not wear nail polish including gel and S&S, artificial/acrylic nails, or any other type of covering on natural nails including finger and toenails. If you have artificial nails, gel coating, etc. that needs to be removed by a nail salon please have this removed prior to surgery or surgery may need to be canceled/ delayed if the surgeon/ anesthesia feels like they are unable to be safely monitored.   Do not shave  48 hours prior to surgery.    Do not bring valuables to the hospital. [place].   Contacts, glasses, or bridgework may not be worn into surgery.   Bring small overnight bag day of surgery.   DO NOT [place]. PHARMACY WILL DISPENSE MEDICATIONS LISTED ON YOUR MEDICATION LIST TO YOU DURING YOUR ADMISSION [place]!    Patients discharged on the day of surgery will not be allowed to drive home.  Someone NEEDS to stay with you for the first 24 hours after anesthesia.   Special Instructions: Bring a copy of your healthcare power of attorney and living will documents         the day of surgery if you haven't scanned them before.              Please read over the following fact sheets you were given: IF YOU HAVE QUESTIONS ABOUT YOUR PRE-OP INSTRUCTIONS PLEASE CALL [phone]    [facility] - Preparing for Surgery Before surgery, you can play an important role.  Because skin is not sterile, your skin needs to be as free of germs as possible.  You can reduce the number of germs on your skin by washing with CHG (chlorahexidine gluconate) soap before surgery.  CHG is an antiseptic cleaner which kills germs and bonds with the skin to continue  killing germs even after washing. Please DO NOT use if you have an allergy to CHG or antibacterial soaps.  If your skin becomes reddened/irritated stop using the CHG and inform your nurse when you arrive at Short Stay. Do not shave (including legs and underarms) for at least 48 hours prior to the first CHG shower.  You may shave your face/neck. Please follow these instructions carefully:  1.  Shower with CHG Soap the night before surgery and the  morning of Surgery.  2.  If you choose to wash your hair, wash your hair first as usual with your  normal  shampoo.  3.  After you shampoo, rinse your hair and body thoroughly to remove the  shampoo.                           4.  Use CHG as you would any other liquid soap.  You can apply chg directly  to the skin and wash                       Gently with a scrungie or clean washcloth.  5.  Apply the CHG Soap to your body ONLY FROM THE NECK DOWN.   Do not use on face/ open                           Wound or open sores. Avoid contact with eyes,  ears mouth and genitals (private parts).                       Wash face,  Genitals (private parts) with your normal soap.             6.  Wash thoroughly, paying special attention to the area where your surgery  will be performed.  7.  Thoroughly rinse your body with warm water from the neck down.  8.  DO NOT shower/wash with your normal soap after using and rinsing off  the CHG Soap.                9.  Pat yourself dry with a clean towel.            10.  Wear clean pajamas.            11.  Place clean sheets on your bed the night of your first shower and do not  sleep with pets. Day of Surgery : Do not apply any lotions/deodorants the morning of surgery.  Please wear clean clothes to the hospital/surgery center.  FAILURE TO FOLLOW THESE INSTRUCTIONS MAY RESULT IN THE CANCELLATION OF YOUR SURGERY PATIENT SIGNATURE_________________________________  NURSE  SIGNATURE__________________________________  ________________________________________________________________________

## 2023-01-14 ENCOUNTER — Encounter (HOSPITAL_COMMUNITY)
Admission: RE | Admit: 2023-01-14 | Discharge: 2023-01-14 | Disposition: A | Discharge status: Home / Self Care | Payer: Medicare Other | Source: Ambulatory Visit | Attending: General Surgery | Admitting: General Surgery

## 2023-01-14 ENCOUNTER — Encounter (HOSPITAL_COMMUNITY): Payer: Self-pay

## 2023-01-14 ENCOUNTER — Other Ambulatory Visit: Payer: Self-pay

## 2023-01-14 VITALS — BP 136/60 | HR 73 | Temp 98.8°F | Ht 65.0 in | Wt 139.0 lb

## 2023-01-14 DIAGNOSIS — Z01812 Encounter for preprocedural laboratory examination: Secondary | ICD-10-CM | POA: Insufficient documentation

## 2023-01-14 DIAGNOSIS — I10 Essential (primary) hypertension: Secondary | ICD-10-CM | POA: Diagnosis not present

## 2023-01-14 HISTORY — DX: Pneumonia, unspecified organism: J18.9

## 2023-01-14 HISTORY — DX: Cardiac murmur, unspecified: R01.1

## 2023-01-14 HISTORY — DX: Peripheral vascular disease, unspecified: I73.9

## 2023-01-14 HISTORY — DX: Palpitations: R00.2

## 2023-01-14 LAB — CBC
HCT: 40.1 % (ref 36.0–46.0)
Hemoglobin: 13.1 g/dL (ref 12.0–15.0)
MCH: 29.4 pg (ref 26.0–34.0)
MCHC: 32.7 g/dL (ref 30.0–36.0)
MCV: 90.1 fL (ref 80.0–100.0)
Platelets: 287 10*3/uL (ref 150–400)
RBC: 4.45 MIL/uL (ref 3.87–5.11)
RDW: 13.1 % (ref 11.5–15.5)
WBC: 7.4 10*3/uL (ref 4.0–10.5)
nRBC: 0 % (ref 0.0–0.2)

## 2023-01-14 LAB — BASIC METABOLIC PANEL
Anion gap: 4 — ABNORMAL LOW (ref 5–15)
BUN: 13 mg/dL (ref 8–23)
CO2: 29 mmol/L (ref 22–32)
Calcium: 8.8 mg/dL — ABNORMAL LOW (ref 8.9–10.3)
Chloride: 103 mmol/L (ref 98–111)
Creatinine, Ser: 0.55 mg/dL (ref 0.44–1.00)
GFR, Estimated: >60 mL/min (ref >60)
Glucose, Bld: 100 mg/dL — ABNORMAL HIGH (ref 70–99)
Potassium: 3.8 mmol/L (ref 3.5–5.1)
Sodium: 136 mmol/L (ref 135–145)

## 2023-01-14 NOTE — Progress Notes (Signed)
For Short Stay: COVID SWAB appointment date:  Bowel Prep reminder:   For Anesthesia: PCP - Dr. Betty Swaziland. LOV: 11/29/22 Cardiologist - Dr. Charlton Haws. LOV: 08/05/22  Chest x-ray - 06/12/22 EKG - 04/23/22 Stress Test -  ECHO -  Cardiac Cath -  Pacemaker/ICD device last checked: Pacemaker orders received: Device Rep notified:  Spinal Cord Stimulator: N/A  Sleep Study - N/A CPAP -   Fasting Blood Sugar - N/A Checks Blood Sugar _____ times a day Date and result of last Hgb A1c-  Last dose of GLP1 agonist- N/A GLP1 instructions:   Last dose of SGLT-2 inhibitors- N/A SGLT-2 instructions:   Blood Thinner Instructions:N/A Aspirin Instructions: Last Dose:  Activity level: Can go up a flight of stairs and activities of daily living without stopping and without chest pain and/or shortness of breath   Able to exercise without chest pain and/or shortness of breath  Anesthesia review: Hx: HTN,Palpitations,PAD,Bicuspid aortic valve.  Patient denies shortness of breath, fever, cough and chest pain at PAT appointment   Patient verbalized understanding of instructions that were given to them at the PAT appointment. Patient was also instructed that they will need to review over the PAT instructions again at home before surgery.

## 2023-01-15 DIAGNOSIS — S76012D Strain of muscle, fascia and tendon of left hip, subsequent encounter: Secondary | ICD-10-CM | POA: Diagnosis not present

## 2023-01-15 DIAGNOSIS — M7632 Iliotibial band syndrome, left leg: Secondary | ICD-10-CM | POA: Diagnosis not present

## 2023-01-29 ENCOUNTER — Encounter (HOSPITAL_COMMUNITY)
Admission: RE | Disposition: A | Discharge status: Home / Self Care | Payer: Self-pay | Source: Home / Self Care | Attending: General Surgery

## 2023-01-29 ENCOUNTER — Other Ambulatory Visit: Payer: Self-pay

## 2023-01-29 ENCOUNTER — Inpatient Hospital Stay (HOSPITAL_COMMUNITY)
Admission: RE | Admit: 2023-01-29 | Discharge: 2023-02-01 | DRG: 330 | Disposition: A | Discharge status: Home / Self Care | Payer: Medicare Other | Attending: General Surgery | Admitting: General Surgery

## 2023-01-29 ENCOUNTER — Inpatient Hospital Stay (HOSPITAL_COMMUNITY): Payer: Medicare Other | Admitting: Physician Assistant

## 2023-01-29 ENCOUNTER — Encounter (HOSPITAL_COMMUNITY): Payer: Self-pay | Admitting: General Surgery

## 2023-01-29 DIAGNOSIS — I739 Peripheral vascular disease, unspecified: Secondary | ICD-10-CM

## 2023-01-29 DIAGNOSIS — K623 Rectal prolapse: Secondary | ICD-10-CM

## 2023-01-29 DIAGNOSIS — E785 Hyperlipidemia, unspecified: Secondary | ICD-10-CM | POA: Diagnosis not present

## 2023-01-29 DIAGNOSIS — I10 Essential (primary) hypertension: Secondary | ICD-10-CM

## 2023-01-29 DIAGNOSIS — K5900 Constipation, unspecified: Secondary | ICD-10-CM | POA: Diagnosis present

## 2023-01-29 DIAGNOSIS — A809 Acute poliomyelitis, unspecified: Secondary | ICD-10-CM | POA: Diagnosis present

## 2023-01-29 DIAGNOSIS — K219 Gastro-esophageal reflux disease without esophagitis: Secondary | ICD-10-CM | POA: Diagnosis not present

## 2023-01-29 DIAGNOSIS — Z79890 Hormone replacement therapy (postmenopausal): Secondary | ICD-10-CM

## 2023-01-29 DIAGNOSIS — E039 Hypothyroidism, unspecified: Secondary | ICD-10-CM | POA: Diagnosis not present

## 2023-01-29 DIAGNOSIS — Z90710 Acquired absence of both cervix and uterus: Secondary | ICD-10-CM | POA: Diagnosis not present

## 2023-01-29 DIAGNOSIS — Z8249 Family history of ischemic heart disease and other diseases of the circulatory system: Secondary | ICD-10-CM

## 2023-01-29 DIAGNOSIS — F419 Anxiety disorder, unspecified: Secondary | ICD-10-CM | POA: Diagnosis present

## 2023-01-29 DIAGNOSIS — F418 Other specified anxiety disorders: Secondary | ICD-10-CM | POA: Diagnosis not present

## 2023-01-29 DIAGNOSIS — Q438 Other specified congenital malformations of intestine: Secondary | ICD-10-CM | POA: Diagnosis not present

## 2023-01-29 DIAGNOSIS — Z79899 Other long term (current) drug therapy: Secondary | ICD-10-CM | POA: Diagnosis not present

## 2023-01-29 HISTORY — PX: XI ROBOT ASSISTED RECTOPEXY: SHX6788

## 2023-01-29 SURGERY — RECTOPEXY, ROBOT-ASSISTED
Anesthesia: General

## 2023-01-29 MED ORDER — EPHEDRINE 5 MG/ML INJ
INTRAVENOUS | Status: AC
  Filled 2023-01-29: qty 5

## 2023-01-29 MED ORDER — FENTANYL CITRATE PF 50 MCG/ML IJ SOSY
25.0000 ug | PREFILLED_SYRINGE | INTRAMUSCULAR | Status: DC | PRN
  Administered 2023-01-29: 50 ug via INTRAVENOUS

## 2023-01-29 MED ORDER — ONDANSETRON HCL 4 MG/2ML IJ SOLN
INTRAMUSCULAR | Status: AC
  Filled 2023-01-29: qty 2

## 2023-01-29 MED ORDER — ALVIMOPAN 12 MG PO CAPS
12.0000 mg | ORAL_CAPSULE | Freq: Two times a day (BID) | ORAL | Status: DC
  Administered 2023-01-30: 12 mg via ORAL
  Filled 2023-01-29: qty 1

## 2023-01-29 MED ORDER — LACTATED RINGERS IV SOLN: INTRAVENOUS | Status: DC | PRN

## 2023-01-29 MED ORDER — HYDROMORPHONE HCL 1 MG/ML IJ SOLN
0.5000 mg | INTRAMUSCULAR | Status: DC | PRN
  Administered 2023-01-31 – 2023-02-01 (×3): 0.5 mg via INTRAVENOUS
  Filled 2023-01-29 (×3): qty 0.5

## 2023-01-29 MED ORDER — ROSUVASTATIN CALCIUM 5 MG PO TABS
5.0000 mg | ORAL_TABLET | ORAL | Status: DC
  Administered 2023-01-31: 5 mg via ORAL
  Filled 2023-01-29: qty 1

## 2023-01-29 MED ORDER — FENTANYL CITRATE (PF) 100 MCG/2ML IJ SOLN
INTRAMUSCULAR | Status: AC
  Filled 2023-01-29: qty 2

## 2023-01-29 MED ORDER — ENOXAPARIN SODIUM 40 MG/0.4ML IJ SOSY
40.0000 mg | PREFILLED_SYRINGE | INTRAMUSCULAR | Status: DC
  Administered 2023-01-30 – 2023-01-31 (×2): 40 mg via SUBCUTANEOUS
  Filled 2023-01-29 (×2): qty 0.4

## 2023-01-29 MED ORDER — GABAPENTIN 300 MG PO CAPS
300.0000 mg | ORAL_CAPSULE | ORAL | Status: AC
  Administered 2023-01-29: 300 mg via ORAL
  Filled 2023-01-29: qty 1

## 2023-01-29 MED ORDER — PROPOFOL 10 MG/ML IV BOLUS
INTRAVENOUS | Status: AC
  Filled 2023-01-29: qty 20

## 2023-01-29 MED ORDER — ONDANSETRON HCL 4 MG/2ML IJ SOLN
INTRAMUSCULAR | Status: DC | PRN
  Administered 2023-01-29: 4 mg via INTRAVENOUS

## 2023-01-29 MED ORDER — SACCHAROMYCES BOULARDII 250 MG PO CAPS
250.0000 mg | ORAL_CAPSULE | Freq: Two times a day (BID) | ORAL | Status: DC
  Administered 2023-01-29 – 2023-01-31 (×5): 250 mg via ORAL
  Filled 2023-01-29 (×5): qty 1

## 2023-01-29 MED ORDER — TRAMADOL HCL 50 MG PO TABS
50.0000 mg | ORAL_TABLET | Freq: Four times a day (QID) | ORAL | Status: DC | PRN
  Administered 2023-01-29 – 2023-01-30 (×2): 50 mg via ORAL
  Administered 2023-01-30: 100 mg via ORAL
  Administered 2023-01-30: 50 mg via ORAL
  Administered 2023-01-31 – 2023-02-01 (×2): 100 mg via ORAL
  Filled 2023-01-29: qty 1
  Filled 2023-01-29: qty 2
  Filled 2023-01-29: qty 1
  Filled 2023-01-29: qty 2
  Filled 2023-01-29: qty 1
  Filled 2023-01-29: qty 2

## 2023-01-29 MED ORDER — ONDANSETRON HCL 4 MG/2ML IJ SOLN
4.0000 mg | Freq: Four times a day (QID) | INTRAMUSCULAR | Status: DC | PRN
  Administered 2023-01-30: 4 mg via INTRAVENOUS
  Filled 2023-01-29: qty 2

## 2023-01-29 MED ORDER — ACETAMINOPHEN 500 MG PO TABS
1000.0000 mg | ORAL_TABLET | Freq: Four times a day (QID) | ORAL | Status: DC
  Administered 2023-01-29 – 2023-02-01 (×11): 1000 mg via ORAL
  Filled 2023-01-29 (×11): qty 2

## 2023-01-29 MED ORDER — DEXAMETHASONE SODIUM PHOSPHATE 10 MG/ML IJ SOLN
INTRAMUSCULAR | Status: AC
  Filled 2023-01-29: qty 1

## 2023-01-29 MED ORDER — LORAZEPAM 0.5 MG PO TABS: 0.5000 mg | ORAL_TABLET | Freq: Four times a day (QID) | ORAL | Status: DC | PRN

## 2023-01-29 MED ORDER — ROCURONIUM BROMIDE 10 MG/ML (PF) SYRINGE
PREFILLED_SYRINGE | INTRAVENOUS | Status: AC
  Filled 2023-01-29: qty 10

## 2023-01-29 MED ORDER — DEXAMETHASONE SODIUM PHOSPHATE 10 MG/ML IJ SOLN
INTRAMUSCULAR | Status: DC | PRN
  Administered 2023-01-29: 8 mg via INTRAVENOUS

## 2023-01-29 MED ORDER — DIPHENHYDRAMINE HCL 12.5 MG/5ML PO ELIX: 12.5000 mg | ORAL_SOLUTION | Freq: Four times a day (QID) | ORAL | Status: DC | PRN

## 2023-01-29 MED ORDER — CITALOPRAM HYDROBROMIDE 20 MG PO TABS
20.0000 mg | ORAL_TABLET | Freq: Every day | ORAL | Status: DC
  Administered 2023-01-30 – 2023-02-01 (×3): 20 mg via ORAL
  Filled 2023-01-29 (×3): qty 1

## 2023-01-29 MED ORDER — FENTANYL CITRATE PF 50 MCG/ML IJ SOSY
PREFILLED_SYRINGE | INTRAMUSCULAR | Status: AC
  Administered 2023-01-29: 50 ug via INTRAVENOUS
  Filled 2023-01-29: qty 3

## 2023-01-29 MED ORDER — CHLORHEXIDINE GLUCONATE 0.12 % MT SOLN
15.0000 mL | Freq: Once | OROMUCOSAL | Status: AC
  Administered 2023-01-29: 15 mL via OROMUCOSAL

## 2023-01-29 MED ORDER — ROCURONIUM BROMIDE 10 MG/ML (PF) SYRINGE
PREFILLED_SYRINGE | INTRAVENOUS | Status: DC | PRN
  Administered 2023-01-29: 20 mg via INTRAVENOUS
  Administered 2023-01-29: 60 mg via INTRAVENOUS

## 2023-01-29 MED ORDER — BENAZEPRIL HCL 20 MG PO TABS
20.0000 mg | ORAL_TABLET | Freq: Every day | ORAL | Status: DC
  Administered 2023-01-29 – 2023-01-31 (×3): 20 mg via ORAL
  Filled 2023-01-29 (×4): qty 1

## 2023-01-29 MED ORDER — LEVOTHYROXINE SODIUM 88 MCG PO TABS
88.0000 ug | ORAL_TABLET | Freq: Every day | ORAL | Status: DC
  Administered 2023-01-30 – 2023-02-01 (×3): 88 ug via ORAL
  Filled 2023-01-29 (×3): qty 1

## 2023-01-29 MED ORDER — LIDOCAINE HCL (PF) 2 % IJ SOLN
INTRAMUSCULAR | Status: AC
  Filled 2023-01-29: qty 5

## 2023-01-29 MED ORDER — ORAL CARE MOUTH RINSE: 15.0000 mL | Freq: Once | OROMUCOSAL | Status: AC

## 2023-01-29 MED ORDER — ONDANSETRON HCL 4 MG/2ML IJ SOLN: 4.0000 mg | Freq: Once | INTRAMUSCULAR | Status: DC | PRN

## 2023-01-29 MED ORDER — LIDOCAINE HCL (PF) 2 % IJ SOLN
INTRAMUSCULAR | Status: DC | PRN
  Administered 2023-01-29: 1.5 mg/kg/h via INTRADERMAL

## 2023-01-29 MED ORDER — 0.9 % SODIUM CHLORIDE (POUR BTL) OPTIME
TOPICAL | Status: DC | PRN
  Administered 2023-01-29: 1000 mL

## 2023-01-29 MED ORDER — ONDANSETRON HCL 4 MG PO TABS: 4.0000 mg | ORAL_TABLET | Freq: Four times a day (QID) | ORAL | Status: DC | PRN

## 2023-01-29 MED ORDER — BUPIVACAINE LIPOSOME 1.3 % IJ SUSP
INTRAMUSCULAR | Status: DC | PRN
  Administered 2023-01-29: 50 mL

## 2023-01-29 MED ORDER — KCL IN DEXTROSE-NACL 20-5-0.45 MEQ/L-%-% IV SOLN
INTRAVENOUS | Status: DC
  Filled 2023-01-29 (×2): qty 1000

## 2023-01-29 MED ORDER — ACETAMINOPHEN 500 MG PO TABS
1000.0000 mg | ORAL_TABLET | Freq: Once | ORAL | Status: AC
  Administered 2023-01-29: 1000 mg via ORAL
  Filled 2023-01-29: qty 2

## 2023-01-29 MED ORDER — BUPIVACAINE LIPOSOME 1.3 % IJ SUSP
INTRAMUSCULAR | Status: AC
  Filled 2023-01-29: qty 20

## 2023-01-29 MED ORDER — ALUM & MAG HYDROXIDE-SIMETH 200-200-20 MG/5ML PO SUSP: 30.0000 mL | Freq: Four times a day (QID) | ORAL | Status: DC | PRN

## 2023-01-29 MED ORDER — HEPARIN SODIUM (PORCINE) 5000 UNIT/ML IJ SOLN
5000.0000 [IU] | Freq: Once | INTRAMUSCULAR | Status: AC
  Administered 2023-01-29: 5000 [IU] via SUBCUTANEOUS
  Filled 2023-01-29: qty 1

## 2023-01-29 MED ORDER — CEFAZOLIN SODIUM-DEXTROSE 2-4 GM/100ML-% IV SOLN
2.0000 g | INTRAVENOUS | Status: AC
  Administered 2023-01-29: 2 g via INTRAVENOUS
  Filled 2023-01-29: qty 100

## 2023-01-29 MED ORDER — PHENYLEPHRINE 80 MCG/ML (10ML) SYRINGE FOR IV PUSH (FOR BLOOD PRESSURE SUPPORT)
PREFILLED_SYRINGE | INTRAVENOUS | Status: AC
  Filled 2023-01-29: qty 10

## 2023-01-29 MED ORDER — SIMETHICONE 80 MG PO CHEW
40.0000 mg | CHEWABLE_TABLET | Freq: Four times a day (QID) | ORAL | Status: DC | PRN
  Administered 2023-01-29 – 2023-01-30 (×3): 40 mg via ORAL
  Filled 2023-01-29 (×3): qty 1

## 2023-01-29 MED ORDER — GABAPENTIN 100 MG PO CAPS
300.0000 mg | ORAL_CAPSULE | Freq: Two times a day (BID) | ORAL | Status: DC
  Administered 2023-01-29 – 2023-01-31 (×5): 300 mg via ORAL
  Filled 2023-01-29 (×5): qty 3

## 2023-01-29 MED ORDER — PHENYLEPHRINE 80 MCG/ML (10ML) SYRINGE FOR IV PUSH (FOR BLOOD PRESSURE SUPPORT)
PREFILLED_SYRINGE | INTRAVENOUS | Status: DC | PRN
  Administered 2023-01-29: 160 ug via INTRAVENOUS

## 2023-01-29 MED ORDER — DIPHENHYDRAMINE HCL 50 MG/ML IJ SOLN: 12.5000 mg | Freq: Four times a day (QID) | INTRAMUSCULAR | Status: DC | PRN

## 2023-01-29 MED ORDER — EPHEDRINE SULFATE-NACL 50-0.9 MG/10ML-% IV SOSY
PREFILLED_SYRINGE | INTRAVENOUS | Status: DC | PRN
  Administered 2023-01-29: 5 mg via INTRAVENOUS

## 2023-01-29 MED ORDER — BENAZEPRIL HCL 20 MG PO TABS: 20.0000 mg | ORAL_TABLET | Freq: Two times a day (BID) | ORAL | Status: DC

## 2023-01-29 MED ORDER — LIDOCAINE 2% (20 MG/ML) 5 ML SYRINGE
INTRAMUSCULAR | Status: DC | PRN
  Administered 2023-01-29: 60 mg via INTRAVENOUS

## 2023-01-29 MED ORDER — PROPOFOL 10 MG/ML IV BOLUS
INTRAVENOUS | Status: DC | PRN
  Administered 2023-01-29: 20 mg via INTRAVENOUS
  Administered 2023-01-29: 130 mg via INTRAVENOUS

## 2023-01-29 MED ORDER — BUPIVACAINE LIPOSOME 1.3 % IJ SUSP: 20.0000 mL | Freq: Once | INTRAMUSCULAR | Status: DC

## 2023-01-29 MED ORDER — SUGAMMADEX SODIUM 200 MG/2ML IV SOLN
INTRAVENOUS | Status: DC | PRN
  Administered 2023-01-29: 200 mg via INTRAVENOUS

## 2023-01-29 MED ORDER — BENAZEPRIL HCL 5 MG PO TABS
10.0000 mg | ORAL_TABLET | Freq: Every day | ORAL | Status: DC
  Administered 2023-01-30 – 2023-01-31 (×2): 10 mg via ORAL
  Filled 2023-01-29 (×3): qty 2

## 2023-01-29 MED ORDER — FENTANYL CITRATE (PF) 100 MCG/2ML IJ SOLN
INTRAMUSCULAR | Status: DC | PRN
  Administered 2023-01-29: 50 ug via INTRAVENOUS
  Administered 2023-01-29: 25 ug via INTRAVENOUS
  Administered 2023-01-29: 50 ug via INTRAVENOUS
  Administered 2023-01-29: 75 ug via INTRAVENOUS

## 2023-01-29 MED ORDER — BUPIVACAINE HCL 0.25 % IJ SOLN
INTRAMUSCULAR | Status: AC
  Filled 2023-01-29: qty 1

## 2023-01-29 MED ORDER — LACTATED RINGERS IV SOLN: INTRAVENOUS | Status: DC

## 2023-01-29 SURGICAL SUPPLY — 65 items
ADH SKN CLS APL DERMABOND .7 (GAUZE/BANDAGES/DRESSINGS) ×2
ANTIFOG SOL W/FOAM PAD STRL (MISCELLANEOUS) ×1
BAG COUNTER SPONGE SURGICOUNT (BAG) ×1 IMPLANT
BAG SPNG CNTER NS LX DISP (BAG) ×1
BLADE SURG 15 STRL LF DISP TIS (BLADE) ×1 IMPLANT
BLADE SURG 15 STRL SS (BLADE) ×1
COVER MAYO STAND STRL (DRAPES) ×1 IMPLANT
COVER SURGICAL LIGHT HANDLE (MISCELLANEOUS) ×1 IMPLANT
COVER TIP SHEARS 8 DVNC (MISCELLANEOUS) ×1 IMPLANT
DERMABOND ADVANCED .7 DNX12 (GAUZE/BANDAGES/DRESSINGS) ×2 IMPLANT
DRAIN CHANNEL 10M FLAT 3/4 FLT (DRAIN) IMPLANT
DRAIN CHANNEL 19F RND (DRAIN) IMPLANT
DRAPE ARM DVNC X/XI (DISPOSABLE) ×4 IMPLANT
DRAPE COLUMN DVNC XI (DISPOSABLE) ×1 IMPLANT
DRAPE SURG IRRIG POUCH 19X23 (DRAPES) ×1 IMPLANT
DRIVER NDL LRG 8 DVNC XI (INSTRUMENTS) ×2 IMPLANT
DRIVER NDLE LRG 8 DVNC XI (INSTRUMENTS) ×1 IMPLANT
ELECT PENCIL ROCKER SW 15FT (MISCELLANEOUS) IMPLANT
ELECT REM PT RETURN 15FT ADLT (MISCELLANEOUS) ×1 IMPLANT
EVACUATOR DRAINAGE 7X20 100CC (MISCELLANEOUS) IMPLANT
EVACUATOR SILICONE 100CC (DRAIN) IMPLANT
EVACUATOR SILICONE 100CC (MISCELLANEOUS) ×1
FORCEPS BPLR FENES DVNC XI (FORCEP) IMPLANT
GLOVE BIO SURGEON STRL SZ 6.5 (GLOVE) ×2 IMPLANT
GLOVE BIOGEL PI IND STRL 7.0 (GLOVE) ×2 IMPLANT
GLOVE INDICATOR 6.5 STRL GRN (GLOVE) ×1 IMPLANT
GOWN STRL REUS W/ TWL XL LVL3 (GOWN DISPOSABLE) ×2 IMPLANT
GOWN STRL REUS W/TWL XL LVL3 (GOWN DISPOSABLE) ×2
GRASPER TIP-UP FEN DVNC XI (INSTRUMENTS) ×1 IMPLANT
HOLDER FOLEY CATH W/STRAP (MISCELLANEOUS) ×1 IMPLANT
IRRIG SUCT STRYKERFLOW 2 WTIP (MISCELLANEOUS) ×1
IRRIGATION SUCT STRKRFLW 2 WTP (MISCELLANEOUS) ×1 IMPLANT
KIT BASIN OR (CUSTOM PROCEDURE TRAY) ×1 IMPLANT
KIT TURNOVER KIT A (KITS) IMPLANT
LEGGING LITHOTOMY PAIR STRL (DRAPES) ×1 IMPLANT
LUBRICANT JELLY K Y 4OZ (MISCELLANEOUS) ×1 IMPLANT
NDL HYPO 22X1.5 SAFETY MO (MISCELLANEOUS) ×1 IMPLANT
NDL INSUFFLATION 14GA 120MM (NEEDLE) ×1 IMPLANT
NEEDLE HYPO 22X1.5 SAFETY MO (MISCELLANEOUS) ×1 IMPLANT
NEEDLE INSUFFLATION 14GA 120MM (NEEDLE) ×1 IMPLANT
PACK CARDIOVASCULAR III (CUSTOM PROCEDURE TRAY) ×1 IMPLANT
PAD POSITIONING PINK XL (MISCELLANEOUS) ×1 IMPLANT
PROTECTOR NERVE ULNAR (MISCELLANEOUS) ×2 IMPLANT
SCISSORS LAP 5X45 EPIX DISP (ENDOMECHANICALS) ×1 IMPLANT
SCISSORS MNPLR CVD DVNC XI (INSTRUMENTS) ×1 IMPLANT
SEAL UNIV 5-12 XI (MISCELLANEOUS) ×3 IMPLANT
SEALER VESSEL EXT DVNC XI (MISCELLANEOUS) IMPLANT
SOL ELECTROSURG ANTI STICK (MISCELLANEOUS) ×1
SOLUTION ANTFG W/FOAM PAD STRL (MISCELLANEOUS) ×1 IMPLANT
SOLUTION ELECTROSURG ANTI STCK (MISCELLANEOUS) ×1 IMPLANT
SPIKE FLUID TRANSFER (MISCELLANEOUS) ×1 IMPLANT
SUT ETHIBOND 2 0 SH (SUTURE) ×1
SUT ETHIBOND 2 0 SH 36X2 (SUTURE) ×1 IMPLANT
SUT ETHILON 2 0 PS N (SUTURE) IMPLANT
SUT VIC AB 4-0 PS2 18 (SUTURE) ×1 IMPLANT
SUT VLOC 180 2-0 6IN GS21 (SUTURE) ×2 IMPLANT
SYR 20ML ECCENTRIC (SYRINGE) ×1 IMPLANT
SYR CONTROL 10ML LL (SYRINGE) ×1 IMPLANT
TOWEL OR 17X26 10 PK STRL BLUE (TOWEL DISPOSABLE) ×1 IMPLANT
TOWEL OR NON WOVEN STRL DISP B (DISPOSABLE) ×1 IMPLANT
TRAY FOLEY MTR SLVR 14FR STAT (SET/KITS/TRAYS/PACK) ×1 IMPLANT
TRAY FOLEY MTR SLVR 16FR STAT (SET/KITS/TRAYS/PACK) ×1 IMPLANT
TROCAR ADV FIXATION 5X100MM (TROCAR) ×1 IMPLANT
TUBING CONNECTING 10 (TUBING) IMPLANT
TUBING INSUFFLATION 10FT LAP (TUBING) ×1 IMPLANT

## 2023-01-29 NOTE — Transfer of Care (Signed)
Immediate Anesthesia Transfer of Care Note  Patient: Crystal Levy  Procedure(s) Performed: Desma Paganini ASSISTED RECTOPEXY  Patient Location: PACU  Anesthesia Type:General  Level of Consciousness: oriented, sedated, and patient cooperative  Airway & Oxygen Therapy: Patient Spontanous Breathing and Patient connected to face mask oxygen  Post-op Assessment: Report given to RN and Post -op Vital signs reviewed and stable  Post vital signs: Reviewed  Last Vitals:  Vitals Value Taken Time  BP 157/63 01/29/23 1445  Temp 36.3 C 01/29/23 1439  Pulse 72 01/29/23 1447  Resp 31 01/29/23 1447  SpO2 92 % 01/29/23 1447  Vitals shown include unvalidated device data.  Last Pain:  Vitals:   01/29/23 0946  TempSrc:   PainSc: 0-No pain         Complications: No notable events documented.

## 2023-01-29 NOTE — Anesthesia Postprocedure Evaluation (Signed)
Anesthesia Post Note  Patient: Crystal Levy  Procedure(s) Performed: XI ROBOT ASSISTED RECTOPEXY     Patient location during evaluation: PACU Anesthesia Type: General Level of consciousness: awake and alert Pain management: pain level controlled Vital Signs Assessment: post-procedure vital signs reviewed and stable Respiratory status: spontaneous breathing, nonlabored ventilation, respiratory function stable and patient connected to nasal cannula oxygen Cardiovascular status: blood pressure returned to baseline and stable Postop Assessment: no apparent nausea or vomiting Anesthetic complications: no   No notable events documented.  Last Vitals:  Vitals:   01/29/23 1530 01/29/23 1545  BP: (!) 141/63 (!) 135/55  Pulse: 74 73  Resp: 11 13  Temp:    SpO2: (!) 89% 94%    Last Pain:  Vitals:   01/29/23 1545  TempSrc:   PainSc: 2                  Collene Schlichter

## 2023-01-29 NOTE — H&P (Signed)
REFERRING PHYSICIAN:  Swaziland, Betty G, MD   PROVIDER:  Elenora Gamma, MD   MRN: [id number] DOB: 1940/12/06    Subjective   Chief Complaint: New Consultation ( Rectal prolapse,)       History of Present Illness: Crystal Levy is a 82 y.o. female who is seen today as an office consultation at the request of Dr. Swaziland for evaluation of New Consultation ( Rectal prolapse,) .  She states that for the past 6 to 8 months she has had worsening problems with rectal prolapse.  This used to occur occasionally but now it occurs almost every day.  It is worse when she is walking, she does for exercise every day.  He is having mucosal drainage.  Patient states she has suffered from constipation her whole life.  She controls this with a high-fiber diet and small doses of MiraLAX daily.  Patient has polio with some right-sided muscle weakness.  Last colonoscopy was approximately 4 years ago at [facility].  It was normal except for some diverticulosis and a cecal polyp.     Review of Systems: A complete review of systems was obtained from the patient.  I have reviewed this information and discussed as appropriate with the patient.  See HPI as well for other ROS.       Medical History: Past Medical History: Diagnosis        Date            Anxiety                        Hyperlipidemia                        Hypertension               Thyroid disease               There is no problem list on file for this patient.     Past Surgical History: Procedure       Laterality         Date            Cardiac catheterization                                   CATARACT EXTRACTION                            HERNIA REPAIR                                HYSTERECTOMY                              JOINT REPLACEMENT                                 TONSILLECTOMY                      Allergies Allergen           Reactions            Fenofibrate      Itching and Rash  Fluticasone      Other  (See Comments)                         Headache and nosebleed            Azithromycin   Other (See Comments)            Clindamycin Hcl          Other (See Comments)            Penicillins        Rash            Sulfa (Sulfonamide Antibiotics)          Diarrhea     Current Outpatient Medications on File Prior to Visit Medication       Sig       Dispense         Refill            benazepriL (LOTENSIN) 20 MG tablet          TAKE ONE-HALF TABLET BY MOUTH IN THE MORNING AND 1 TABLET BY  MOUTH IN THE EVENING                               calcium carbonate 600 mg calcium (1,500 mg) Tab tablet    Take 600 mg by mouth 2 (two) times daily with meals                                    cholecalciferol (VITAMIN D3) 1000 unit capsule       Take 2 capsules by mouth once daily                               citalopram (CELEXA) 20 MG tablet    Take 1 tablet by mouth once daily                              levothyroxine (SYNTHROID) 88 MCG tablet Take by mouth                                    LORazepam (ATIVAN) 1 MG tablet   TAKE 1 TABLET BY MOUTH AT BEDTIME AS NEEDED FOR ANXIETY                         rosuvastatin (CRESTOR) 5 MG tablet           Take by mouth                           No current facility-administered medications on file prior to visit.     Family History Problem           Relation           Age of Onset            Coronary Artery Disease (Blocked arteries around heart)     Mother             High blood pressure (Hypertension)   Brother  Social History   Tobacco Use Smoking Status           Never Smokeless Tobacco   Never     Social History   Socioeconomic History            Marital status:  Divorced Tobacco Use            Smoking status:          Never            Smokeless tobacco:    Never Substance and Sexual Activity            Alcohol use:    Not Currently                         Alcohol/week:  1.0 standard drink of alcohol                          Types: 1 Glasses of wine per week            Drug use:        Never   Social Determinants of Health   Financial Resource Strain: Low Risk  (03/05/2022)             Received from [facility], [place]             Overall Financial Resource Strain (CARDIA)                        Difficulty of Paying Living Expenses: Not hard at all Food Insecurity: No Food Insecurity (02/15/2022)             Received from [facility], [place]             Hunger Vital Sign                        Worried About Running Out of Food in the Last Year: Never true                        Ran Out of Food in the Last Year: Never true Transportation Needs: No Transportation Needs (02/15/2022)             Received from [facility], [place]             [facility] - Transportation                        Lack of Transportation (Medical): No                        Lack of Transportation (Non-Medical): No Physical Activity: Sufficiently Active (02/15/2022)             Received from [facility], [place]             Exercise Vital Sign                        Days of Exercise per Week: 7 days                        Minutes of Exercise per Session: 30 min Stress: No Stress Concern Present (02/15/2022)             Received from [facility], [place]             [employer] of Occupational Health - Occupational Stress Questionnaire                        Feeling of Stress : Not at all Social Connections: Socially Isolated (02/15/2022)             Received from [facility], [place]             Social Connection and Isolation Panel [NHANES]                        Frequency of Communication with Friends and Family: More than three times a week                        Frequency of Social Gatherings with Friends and Family: More than three times a week                        Attends Religious Services: Never                        Database administrator or Organizations: No                        Attends Tax inspector Meetings: Never                        Marital Status: Divorced     Objective:     Vitals:   01/29/23 0936  BP: 138/62  Pulse: 63  Resp: 16  Temp: 98.3 F (36.8 C)  SpO2: 99%      Exam Gen: NAD Abd: soft Rectal: Minimal anal gape.  Moderate rectal tone.  No anal rectal masses noted minimal squeeze pressure noted     Labs, Imaging and Diagnostic Testing:   Procedure: Anoscopy 12/24/2022 Surgeon: Maisie Fus After the risks and benefits were explained, written consent was obtained for above procedure.  A medical assistant chaperone was present thoroughout the entire procedure.  Anesthesia: none Diagnosis: rectal prolapse Findings: ~6cm complete rectal prolapse   Assessment and Plan: Diagnoses and all orders for this visit:   Rectal prolapse   82 year old female with significant rectal prolapse.  She is still very active and this is precluding her quality of life.  I have recommended proceeding with robotic assisted rectopexy.  We have discussed this in detail including alternative treatments.  We discussed risk of the procedure including ongoing or worsening constipation, difficulty with bladder function, bleeding, infection and the small chance of recurrence.  I believe the benefits of the surgery outweigh the risks.  I have recommended proceeding.  Patient agrees.  All questions were answered.     Vanita Panda, MD Colon and Rectal Surgery [facility]

## 2023-01-29 NOTE — Anesthesia Preprocedure Evaluation (Addendum)
Anesthesia Evaluation  Patient identified by MRN, date of birth, ID band Patient awake    Reviewed: Allergy & Precautions, NPO status , Patient's Chart, lab work & pertinent test results  Airway Mallampati: I  TM Distance: >3 FB Neck ROM: Full    Dental  (+) Teeth Intact, Dental Advisory Given, Chipped,    Pulmonary neg pulmonary ROS   Pulmonary exam normal breath sounds clear to auscultation       Cardiovascular hypertension, Pt. on medications (-) angina + Peripheral Vascular Disease  (-) CAD and (-) Past MI Normal cardiovascular exam Rhythm:Regular Rate:Normal     Neuro/Psych  PSYCHIATRIC DISORDERS Anxiety Depression    negative neurological ROS     GI/Hepatic Neg liver ROS,GERD  ,,RECTAL PROLAPSE   Endo/Other  Hypothyroidism    Renal/GU negative Renal ROS     Musculoskeletal  (+) Arthritis ,    Abdominal   Peds  Hematology negative hematology ROS (+)   Anesthesia Other Findings Day of surgery medications reviewed with the patient.  Reproductive/Obstetrics                              Anesthesia Physical Anesthesia Plan  ASA: 3  Anesthesia Plan: General   Post-op Pain Management: Tylenol PO (pre-op)* and Toradol IV (intra-op)*   Induction: Intravenous  PONV Risk Score and Plan: 4 or greater and Dexamethasone, Ondansetron and Treatment may vary due to age or medical condition  Airway Management Planned: Oral ETT  Additional Equipment:   Intra-op Plan:   Post-operative Plan: Extubation in OR  Informed Consent: I have reviewed the patients History and Physical, chart, labs and discussed the procedure including the risks, benefits and alternatives for the proposed anesthesia with the patient or authorized representative who has indicated his/her understanding and acceptance.     Dental advisory given  Plan Discussed with: CRNA  Anesthesia Plan Comments:           Anesthesia Quick Evaluation

## 2023-01-29 NOTE — Anesthesia Procedure Notes (Signed)
Procedure Name: Intubation Date/Time: 01/29/2023 12:06 PM  Performed by: Adria Dill, CRNAPre-anesthesia Checklist: Patient identified, Emergency Drugs available, Suction available and Patient being monitored Patient Re-evaluated:Patient Re-evaluated prior to induction Oxygen Delivery Method: Circle system utilized Preoxygenation: Pre-oxygenation with 100% oxygen Induction Type: IV induction Ventilation: Mask ventilation without difficulty Laryngoscope Size: Miller and 2 Grade View: Grade I Tube type: Oral Tube size: 7.0 mm Number of attempts: 1 Airway Equipment and Method: Stylet and Oral airway Placement Confirmation: ETT inserted through vocal cords under direct vision, positive ETCO2 and breath sounds checked- equal and bilateral Secured at: 21 cm Tube secured with: Tape Dental Injury: Teeth and Oropharynx as per pre-operative assessment

## 2023-01-29 NOTE — Op Note (Signed)
01/29/2023  2:14 PM  PATIENT:  Crystal Levy  82 y.o. female  Patient Care Team: [place], Betty G, MD as PCP - General (Family Medicine) Verner Chol, [facility] (Inactive) as Pharmacist (Pharmacist)  PRE-OPERATIVE DIAGNOSIS:  RECTAL PROLAPSE  POST-OPERATIVE DIAGNOSIS:  RECTAL PROLAPSE  PROCEDURE:  XI ROBOT ASSISTED RECTOPEXY    Surgeon(s): Romie Levee, MD Karie Soda, MD  ASSISTANT: Dr Michaell Cowing   ANESTHESIA:   local and general  EBL:20 ml  Total I/O In: 900 [I.V.:800; IV Piggyback:100] Out: 320 [Urine:300; Blood:20]  Delay start of Pharmacological VTE agent (>24hrs) due to surgical blood loss or risk of bleeding:  no  DRAINS: (30F) Jackson-Pratt drain(s) with closed bulb suction in the pelvis    SPECIMEN:  Source of Specimen:  none  DISPOSITION OF SPECIMEN:  PATHOLOGY  COUNTS:  YES  PLAN OF CARE: Admit to inpatient   PATIENT DISPOSITION:  PACU - hemodynamically stable.  INDICATION:    82 year old female with full-thickness rectal prolapse.  I recommended robotic rectopexy:  The anatomy & physiology of the digestive tract was discussed.  The pathophysiology was discussed.  Natural history risks without surgery was discussed.   I worked to give an overview of the disease and the frequent need to have multispecialty involvement.  I feel the risks of no intervention will lead to serious problems that outweigh the operative risks; therefore, I recommended proceeding with surgery. Risks such as bleeding, infection, abscess, leak, reoperation, possible ostomy, hernia, heart attack, death, and other risks were discussed.  I noted a good likelihood this will help address the problem.   Goals of post-operative recovery were discussed as well.    The patient expressed understanding & wished to proceed with surgery.  OR FINDINGS:   Patient had a long redundant sigmoid colon   DESCRIPTION:   Informed consent was confirmed.  The patient underwent general anaesthesia  without difficulty.  The patient was positioned appropriately.  VTE prevention in place.  The patient's abdomen was clipped, prepped, & draped in a sterile fashion.  Surgical timeout confirmed our plan.  The patient was positioned in reverse Trendelenburg.  Abdominal entry was gained using a Varies needle in the LUQ.  Entry was clean.  I induced carbon dioxide insufflation.  An 8mm robotic port was placed in the LUQ.  Camera inspection revealed no injury.  Extra ports were carefully placed under direct laparoscopic visualization.  I laparoscopically reflected the greater omentum and the upper abdomen the small bowel in the upper abdomen. The patient was appropriately positioned and the robot was docked to the patient's left side.  Instruments were placed under direct visualization.    I mobilized the sigmoid colon off of the pelvic sidewall.  I scored the base of peritoneum of the right side of the mesentery of the left colon to the peritoneal reflection of the mid rectum.  The patient had some scarring from her hysterectomy.  I elevated the sigmoid mesentery and enetered into the retro-mesenteric plane. We were able to identify the left ureter and gonadal vessels. We kept those posterior within the retroperitoneum and elevated the left colon mesentery off that. I did isolated IMA pedicle but did not ligate it yet.  I continued distally and got into the avascular plane posterior to the mesorectum. This allowed me to help mobilize the rectum as well by freeing the mesorectum off the sacrum.  I mobilized the right peritoneal coverings towards the peritoneal reflection.  I continued to mobilize the rectum down to  the pelvic floor posteriorly and on the right side.  I retracted the peritoneal reflection to the sacrum and evaluated this with digital rectal exam.  There is no further prolapse.  There was good tension on the rectum.  I then secured the peritoneal reflection to the mid sacrum periosteum using a 2-0  V-Loc suture.  This secured into the place so that I could put 2, 2-0 Ethibond sutures through the peritoneal reflection and the sacral promontory periosteum.  This secured the rectum to the bony prominence of the sacrum.  Once this was complete I checked again to make sure that the rectum had no further prolapse.  I then used a 2 oh V-Loc suture to close the peritoneal coverings proximally and distally from the rectopexy site.  A 19 [place] Blake drain was placed into the pelvis.  This was secured into place with a 2-0 nylon suture in the right lower quadrant.  The sigmoid colon was then pexied to the left upper peritoneum using the remnant of the 2 oh V-Loc suture.  Once this was complete the entire abdomen was inspected.  All needles and suture were removed.  Instruments were removed as well as ports.  The abdomen was desufflated.  The skin incisions were then closed using interrupted 4-0 Vicryl suture and Dermabond.  The patient was then awakened from anesthesia and sent to the post anesthesia care unit in stable condition.  All counts were correct per operating room staff.  An MD assistant was necessary for tissue manipulation, retraction and positioning due to the complexity of the case and hospital policies   Vanita Panda, MD  Colorectal and General Surgery [facility]

## 2023-01-30 ENCOUNTER — Encounter (HOSPITAL_COMMUNITY): Payer: Self-pay | Admitting: General Surgery

## 2023-01-30 LAB — BASIC METABOLIC PANEL
Anion gap: 5 (ref 5–15)
BUN: 9 mg/dL (ref 8–23)
CO2: 27 mmol/L (ref 22–32)
Calcium: 8.9 mg/dL (ref 8.9–10.3)
Chloride: 105 mmol/L (ref 98–111)
Creatinine, Ser: 0.6 mg/dL (ref 0.44–1.00)
GFR, Estimated: >60 mL/min (ref >60)
Glucose, Bld: 170 mg/dL — ABNORMAL HIGH (ref 70–99)
Potassium: 4.5 mmol/L (ref 3.5–5.1)
Sodium: 137 mmol/L (ref 135–145)

## 2023-01-30 LAB — CBC
HCT: 39.2 % (ref 36.0–46.0)
Hemoglobin: 12.7 g/dL (ref 12.0–15.0)
MCH: 29.3 pg (ref 26.0–34.0)
MCHC: 32.4 g/dL (ref 30.0–36.0)
MCV: 90.3 fL (ref 80.0–100.0)
Platelets: 256 10*3/uL (ref 150–400)
RBC: 4.34 MIL/uL (ref 3.87–5.11)
RDW: 13.2 % (ref 11.5–15.5)
WBC: 12.5 10*3/uL — ABNORMAL HIGH (ref 4.0–10.5)
nRBC: 0 % (ref 0.0–0.2)

## 2023-01-30 MED ORDER — POLYETHYLENE GLYCOL 3350 17 G PO PACK: 17.0000 g | PACK | Freq: Every day | ORAL | Status: DC

## 2023-01-30 MED ORDER — POLYETHYLENE GLYCOL 3350 17 G PO PACK
17.0000 g | PACK | Freq: Two times a day (BID) | ORAL | Status: DC
  Administered 2023-01-30 – 2023-01-31 (×3): 17 g via ORAL
  Filled 2023-01-30 (×3): qty 1

## 2023-01-30 MED ORDER — POLYETHYLENE GLYCOL 3350 17 G PO PACK
17.0000 g | PACK | Freq: Two times a day (BID) | ORAL | Status: DC
  Administered 2023-01-30: 17 g via ORAL
  Filled 2023-01-30: qty 1

## 2023-01-30 NOTE — TOC Initial Note (Signed)
Transition of Care [facility]) - Initial/Assessment Note    Patient Details  Name: Crystal Levy MRN: [id number] Date of Birth: 10/07/1940  Transition of Care [facility] CM/SW Contact:    Adrian Prows, RN Phone Number: 01/30/2023, 1:02 PM  Clinical Narrative:                 TOC for d/c planning; spoke w/ pt in room; pt says she is from home and plans to return at d/c; she identified POC Iona Beard (son) [phone]; pt denies IPV, food insecurity, difficulty paying utilites; pt says she has transportation; pt says she has glasses, cane, walker, and shower chair; pt also says she has grab bars in shower; she does not have HH services or home oxygen; awaiting PT eval; TOC will follow.  Expected Discharge Plan: Home/Self Care Barriers to Discharge: Continued Medical Work up   Patient Goals and CMS Choice Patient states their goals for this hospitalization and ongoing recovery are:: home          Expected Discharge Plan and Services   Discharge Planning Services: CM Consult   Living arrangements for the past 2 months: Single Family Home                                      Prior Living Arrangements/Services Living arrangements for the past 2 months: Single Family Home Lives with:: Self Patient language and need for interpreter reviewed:: Yes Do you feel safe going back to the place where you live?: Yes      Need for Family Participation in Patient Care: Yes (Comment) Care giver support system in place?: Yes (comment) Current home services: DME (cane, walker, shower chair) Criminal Activity/Legal Involvement Pertinent to Current Situation/Hospitalization: No - Comment as needed  Activities of Daily Living Home Assistive Devices/Equipment: Cane (specify quad or straight) ADL Screening (condition at time of admission) Patient's cognitive ability adequate to safely complete daily activities?: Yes Is the patient deaf or have difficulty hearing?: No Does the patient  have difficulty seeing, even when wearing glasses/contacts?: No Does the patient have difficulty concentrating, remembering, or making decisions?: No Patient able to express need for assistance with ADLs?: Yes Does the patient have difficulty dressing or bathing?: No Independently performs ADLs?: Yes (appropriate for developmental age) Does the patient have difficulty walking or climbing stairs?: No Weakness of Legs: Right Weakness of Arms/Hands: Right  Permission Sought/Granted Permission sought to share information with : Case Manager Permission granted to share information with : Yes, Verbal Permission Granted  Share Information with NAME: Burnard Bunting, RN, CM     Permission granted to share info w Relationship: Iona Beard (son) [phone]     Emotional Assessment Appearance:: Appears stated age Attitude/Demeanor/Rapport: Gracious Affect (typically observed): Accepting Orientation: : Oriented to Self, Oriented to Place, Oriented to  Time, Oriented to Situation Alcohol / Substance Use: Not Applicable Psych Involvement: No (comment)  Admission diagnosis:  Rectal prolapse [K62.3] Patient Active Problem List   Diagnosis Date Noted   Rectal prolapse 11/04/2022   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   Atherosclerosis of aorta (HCC) 06/12/2022   Thyroid nodule 10/10/2021   DNR (do not resuscitate) 10/10/2021   Osteopenia 10/10/2021   Generalized osteoarthritis of multiple sites 04/11/2020   Insomnia 03/21/2020   GERD (gastroesophageal reflux disease) 02/01/2020   Anxiety disorder, unspecified 01/31/2020   PAD (peripheral artery disease) (HCC) 01/31/2020  Hyperlipidemia 08/30/2019   Hypothyroidism (acquired) 03/01/2019   HTN (hypertension) 04/29/2013   Aortic stenosis 02/06/2013   PCP:  [place], Betty G, MD Pharmacy:   [facility]) - [place], [place] - [address] [place] [address] [place]  [place] [address] Phone: [phone] Fax: [phone]  [facility] - [place], [place] - [address] [place] [address] Phone: [phone] Fax: [phone]  [facility] [address] [place], [place] - [address] [place] [address] Phone: [phone] Fax: [phone]  [facility] [address] [place] [address] Phone: [phone] Fax: [phone]     Social Determinants of Health (SDOH) Social History: SDOH Screenings   Food Insecurity: No Food Insecurity (01/30/2023)  Housing: Low Risk  (01/30/2023)  Transportation Needs: No Transportation Needs (01/30/2023)  Utilities: Not At Risk (01/30/2023)  Alcohol Screen: Low Risk  (02/15/2022)  Depression (PHQ2-9): Low Risk  (11/04/2022)  Financial Resource Strain: Low Risk  (03/05/2022)  Recent Concern: Financial Resource Strain - Medium Risk (01/03/2022)  Physical Activity: Sufficiently Active (02/15/2022)  Recent Concern: Physical Activity - Insufficiently Active (01/03/2022)  Social Connections: Socially Isolated (02/15/2022)  Stress: No Stress Concern Present (02/15/2022)  Recent Concern: Stress - Stress Concern Present (01/03/2022)  Tobacco Use: Low Risk  (01/30/2023)   SDOH Interventions: Food Insecurity Interventions: Inpatient TOC Housing Interventions: Inpatient TOC Transportation Interventions: Inpatient TOC Utilities Interventions: Inpatient TOC   Readmission Risk Interventions     No data to display

## 2023-01-30 NOTE — Progress Notes (Signed)
Mobility Specialist - Progress Note   01/30/23 0940  Mobility  Activity Transferred to/from [facility]  Level of Assistance Standby assist, set-up cues, supervision of patient - no hands on  Distance Ambulated (ft) 2 ft  Activity Response Tolerated well  Mobility Referral Yes  $Mobility charge 1 Mobility  Mobility Specialist Start Time (ACUTE ONLY) 0931  Mobility Specialist Stop Time (ACUTE ONLY) 0940  Mobility Specialist Time Calculation (min) (ACUTE ONLY) 9 min   Pt received on [place] requesting assistance back to bed. Prior to getting up pt had some green emesis. Pt stated she felt better afterwards. No complaints during session. Pt to bed after session with all needs met. NT & nurse made aware of occurrences.  [facility]

## 2023-01-30 NOTE — Progress Notes (Signed)
1 Day Post-Op Robotic Rectopexy Subjective: Having flatus.  No nausea but feels bloated.  Tolerating liquids  Objective: Vital signs in last 24 hours: Temp:  [97.4 F (36.3 C)-98.3 F (36.8 C)] 98.1 F (36.7 C) (05/23 0456) Pulse Rate:  [63-84] 71 (05/23 0456) Resp:  [11-20] 18 (05/23 0456) BP: (125-168)/(53-79) 128/53 (05/23 0456) SpO2:  [89 %-100 %] 96 % (05/23 0456) Weight:  [63 kg] 63 kg (05/22 0946)   Intake/Output from previous day: 05/22 0701 - 05/23 0700 In: 1979.8 [P.O.:600; I.V.:1279.8; IV Piggyback:100] Out: 2080 [Urine:1700; Emesis/NG output:360; Blood:20] Intake/Output this shift: No intake/output data recorded.   General appearance: alert and cooperative GI: soft, distended  Incision: no significant drainage  Lab Results:  Recent Labs    01/30/23 0439  WBC 12.5*  HGB 12.7  HCT 39.2  PLT 256   BMET Recent Labs    01/30/23 0439  NA 137  K 4.5  CL 105  CO2 27  GLUCOSE 170*  BUN 9  CREATININE 0.60  CALCIUM 8.9   PT/INR No results for input(s): "LABPROT", "INR" in the last 72 hours. ABG No results for input(s): "PHART", "HCO3" in the last 72 hours.  Invalid input(s): "PCO2", "PO2"  MEDS, Scheduled  acetaminophen  1,000 mg Oral Q6H   alvimopan  12 mg Oral BID   benazepril  10 mg Oral Daily   And   benazepril  20 mg Oral QHS   citalopram  20 mg Oral Daily   enoxaparin (LOVENOX) injection  40 mg Subcutaneous Q24H   gabapentin  300 mg Oral BID   levothyroxine  88 mcg Oral Q0600   [START ON 01/31/2023] rosuvastatin  5 mg Oral Q M,W,F   saccharomyces boulardii  250 mg Oral BID    Studies/Results: No results found.  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   Atherosclerosis of aorta (HCC) 06/12/2022   Thyroid nodule 10/10/2021   DNR (do not resuscitate) 10/10/2021    Osteopenia 10/10/2021   Generalized osteoarthritis of multiple sites 04/11/2020   Insomnia 03/21/2020   GERD (gastroesophageal reflux disease) 02/01/2020   Anxiety disorder, unspecified 01/31/2020   PAD (peripheral artery disease) (HCC) 01/31/2020   Hyperlipidemia 08/30/2019   Hypothyroidism (acquired) 03/01/2019   HTN (hypertension) 04/29/2013   Aortic stenosis 02/06/2013    Expected post op course  Plan: d/c foley Advance diet to fulls   LOS: 1 day     .Vanita Panda, MD [facility], [place]    01/30/2023 7:41 AM

## 2023-01-30 NOTE — Progress Notes (Signed)
Pharmacy note Entereg (alvimopan) has been removed from the patient profile d/t not meeting eligibility criteria:  - Order is requested by a CREDENTIALED PRESCRIBER  - No therapeutic opioid doses for > 7 consecutive days immediately prior to Alvimopan use   -Prescribed for the FDA-approved indication: partial large or small bowel resection with primary anastomosis. Note: exploratory laparotomy patients can qualify for the preop dose if they can take PO meds.  -Patient received a pre-operative dose.   -Screen for ESRD and severe hepatic disease   -Drug is ordered ORALLY, not via an NG tube  -Schedule first postop dose for the morning of POD#1   -Limit therapy in any patient to NO MORE than 15 doses total.  -Therapy must be discontinued at discharge - the patient should never be given a discharge prescription.  -Pharmacy OK to d/c once pt has a bowel movement    Please call with any questions  Bernadene Person, PharmD, BCPS [phone] 01/30/2023, 12:54 PM

## 2023-01-31 NOTE — Progress Notes (Signed)
2 Days Post-Op Robotic Rectopexy Subjective: Having flatus.  No nausea, feels less bloated.  Tolerating liquids.  Urinating well  Objective: Vital signs in last 24 hours: Temp:  [97.9 F (36.6 C)-98.5 F (36.9 C)] 97.9 F (36.6 C) (05/24 0510) Pulse Rate:  [63-71] 63 (05/24 0510) Resp:  [17-18] 17 (05/24 0510) BP: (114-158)/(55-89) 114/55 (05/24 0510) SpO2:  [94 %-96 %] 94 % (05/24 0510) Weight:  [65.7 kg] 65.7 kg (05/24 0500)   Intake/Output from previous day: 05/23 0701 - 05/24 0700 In: 960 [P.O.:960] Out: 1590 [Urine:1300; Drains:290] Intake/Output this shift: Total I/O In: -  Out: 240 [Urine:200; Drains:40]   General appearance: alert and cooperative GI: soft, distended  Incision: no significant drainage  Lab Results:  Recent Labs    01/30/23 0439  WBC 12.5*  HGB 12.7  HCT 39.2  PLT 256    BMET Recent Labs    01/30/23 0439  NA 137  K 4.5  CL 105  CO2 27  GLUCOSE 170*  BUN 9  CREATININE 0.60  CALCIUM 8.9    PT/INR No results for input(s): "LABPROT", "INR" in the last 72 hours. ABG No results for input(s): "PHART", "HCO3" in the last 72 hours.  Invalid input(s): "PCO2", "PO2"  MEDS, Scheduled  acetaminophen  1,000 mg Oral Q6H   benazepril  10 mg Oral Daily   And   benazepril  20 mg Oral QHS   citalopram  20 mg Oral Daily   enoxaparin (LOVENOX) injection  40 mg Subcutaneous Q24H   gabapentin  300 mg Oral BID   levothyroxine  88 mcg Oral Q0600   polyethylene glycol  17 g Oral BID   rosuvastatin  5 mg Oral Q M,W,F   saccharomyces boulardii  250 mg Oral BID    Studies/Results: No results found.  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   Atherosclerosis of aorta (HCC) 06/12/2022   Thyroid nodule 10/10/2021   DNR (do not resuscitate) 10/10/2021   Osteopenia  10/10/2021   Generalized osteoarthritis of multiple sites 04/11/2020   Insomnia 03/21/2020   GERD (gastroesophageal reflux disease) 02/01/2020   Anxiety disorder, unspecified 01/31/2020   PAD (peripheral artery disease) (HCC) 01/31/2020   Hyperlipidemia 08/30/2019   Hypothyroidism (acquired) 03/01/2019   HTN (hypertension) 04/29/2013   Aortic stenosis 02/06/2013    Expected post op course  Plan:  Advance diet to soft foods Most likely d/c in AM   LOS: 2 days     .Vanita Panda, MD [facility], [place]    01/31/2023 9:07 AM

## 2023-01-31 NOTE — Evaluation (Signed)
Physical Therapy Evaluation Patient Details Name: Crystal Levy MRN: [id number] DOB: 13-Nov-1940 Today's Date: 01/31/2023  History of Present Illness  82 yo female presented to admitted to hospital on 01/29/2023 due to rectal prolapse impacting QOL and scheduled robotic rectopexy. Pt PMH includes but is not limited to anxiety, HTN, HDL, thyroid dz and post polio syndrome.  Clinical Impression    Pt admitted with above diagnosis.  Pt currently with functional limitations due to the deficits listed below (see PT Problem List). Pt presented to therapy in bed, pt indicated mild abdominal pain at rest and increased with mobility, especially trunk flexion impacting I with bed mobility, transfers and lower body dressing. Pt required increased time and S for supine to sit, mod A for sit to supine for B LEs and instructed on use of log roll technique. Pt required min guard for STS from EOB and recliner. Gait assessed with RW for safety, stability and energy conservation and R AFO donned with custom lift shoe for 500 feet with S. Pt is motivated to return to home and to active lifestyle.  Pt will benefit from acute skilled PT to increase their independence and safety with mobility to allow discharge.        Recommendations for follow up therapy are one component of a multi-disciplinary discharge planning process, led by the attending physician.  Recommendations may be updated based on patient status, additional functional criteria and insurance authorization.  Follow Up Recommendations       Assistance Recommended at Discharge Set up Supervision/Assistance  Patient can return home with the following  A little help with walking and/or transfers;A little help with bathing/dressing/bathroom;Assistance with cooking/housework;Assist for transportation;Help with stairs or ramp for entrance    Equipment Recommendations None recommended by PT (pt reports DME in home setting)  Recommendations for Other  Services       Functional Status Assessment Patient has had a recent decline in their functional status and demonstrates the ability to make significant improvements in function in a reasonable and predictable amount of time.     Precautions / Restrictions Precautions Precautions: Fall (R side abdominal JP drain) Required Braces or Orthoses: Other Brace (R AFO and custom shoe) Restrictions Weight Bearing Restrictions: No      Mobility  Bed Mobility Overal bed mobility: Needs Assistance Bed Mobility: Supine to Sit, Sit to Supine     Supine to sit: Supervision, HOB elevated Sit to supine: Mod assist   General bed mobility comments: mod A for LEs for sit to supine pt electing to remain doning B shoes and R AFO, pt required A to don socks, shoes and AFO today due to icnreased pain with trunk flexion associated with JP drain on R side    Transfers Overall transfer level: Needs assistance Equipment used: Rolling walker (2 wheels) Transfers: Sit to/from Stand Sit to Stand: Min guard           General transfer comment: pt required min guard and cues to scoot to edge of recliner and proper UE placement and min guard from EOB for safety    Ambulation/Gait Ambulation/Gait assistance: Supervision Gait Distance (Feet): 500 Feet Assistive device: Rolling walker (2 wheels) Gait Pattern/deviations: Step-through pattern, Ataxic, Narrow base of support Gait velocity: decreased     General Gait Details: R AFO donned and noted decreased motor control and coordination  Stairs            Wheelchair Mobility    Modified Rankin (Stroke Patients Only)  Balance Overall balance assessment: History of Falls, Needs assistance Sitting-balance support: Feet supported Sitting balance-Leahy Scale: Good     Standing balance support: Bilateral upper extremity supported, During functional activity, Reliant on assistive device for balance Standing balance-Leahy Scale: Poor                                Pertinent Vitals/Pain Pain Assessment Pain Assessment: Faces Faces Pain Scale: Hurts even more Pain Descriptors / Indicators: Constant, Dull, Heaviness, Operative site guarding Pain Intervention(s): Limited activity within patient's tolerance, Monitored during session    Home Living Family/patient expects to be discharged to:: Private residence Living Arrangements: Alone Available Help at Discharge: Family;Available PRN/intermittently Type of Home: House (town home) Home Access: Level entry       Home Layout: One level Home Equipment: Agricultural consultant (2 wheels);Cane - single point      Prior Function Prior Level of Function : Independent/Modified Independent;Driving             Mobility Comments: mod I with intermittent use of SPC pending on pt stability, R AFO, ADLs, self care tasks and IADLs       Hand Dominance        Extremity/Trunk Assessment        Lower Extremity Assessment Lower Extremity Assessment: Generalized weakness (R LE weakness associated with post polio)    Cervical / Trunk Assessment Cervical / Trunk Assessment: Normal  Communication   Communication: No difficulties  Cognition Arousal/Alertness: Awake/alert Behavior During Therapy: WFL for tasks assessed/performed Overall Cognitive Status: Within Functional Limits for tasks assessed                                          General Comments      Exercises     Assessment/Plan    PT Assessment Patient needs continued PT services  PT Problem List Decreased activity tolerance;Decreased balance;Decreased mobility;Pain;Decreased strength       PT Treatment Interventions DME instruction;Gait training;Functional mobility training;Therapeutic activities;Therapeutic exercise;Balance training;Neuromuscular re-education;Patient/family education    PT Goals (Current goals can be found in the Care Plan section)  Acute Rehab PT  Goals Patient Stated Goal: go home and return to active life , walking with SPC or no AD PT Goal Formulation: With patient Time For Goal Achievement: 02/14/23 Potential to Achieve Goals: Good    Frequency Min 1X/week     Co-evaluation               AM-PAC PT "6 Clicks" Mobility  Outcome Measure Help needed turning from your back to your side while in a flat bed without using bedrails?: None Help needed moving from lying on your back to sitting on the side of a flat bed without using bedrails?: A Little Help needed moving to and from a bed to a chair (including a wheelchair)?: A Little Help needed standing up from a chair using your arms (e.g., wheelchair or bedside chair)?: A Little Help needed to walk in hospital room?: A Little Help needed climbing 3-5 steps with a railing? : A Lot 6 Click Score: 18    End of Session Equipment Utilized During Treatment: Gait belt;Other (comment) (R AFO and custom shoes) Activity Tolerance: Patient tolerated treatment well Patient left: in bed;with call bell/phone within reach Nurse Communication: Mobility status PT Visit Diagnosis: Unsteadiness on feet (R26.81);Other  abnormalities of gait and mobility (R26.89);Muscle weakness (generalized) (M62.81);History of falling (Z91.81);Pain;Difficulty in walking, not elsewhere classified (R26.2) Pain - part of body:  (abdomen)    Time: 2440-1027 PT Time Calculation (min) (ACUTE ONLY): 26 min   Charges:   PT Evaluation $PT Eval Low Complexity: 1 Low PT Treatments $Gait Training: 8-22 mins        Johnny Bridge, PT Acute Rehab   Jacqualyn Posey 01/31/2023, 12:59 PM

## 2023-02-01 MED ORDER — TRAMADOL HCL 50 MG PO TABS: 50.0000 mg | ORAL_TABLET | Freq: Four times a day (QID) | ORAL | 0 refills | Status: DC | PRN

## 2023-02-01 MED ORDER — POLYETHYLENE GLYCOL 3350 17 G PO PACK: 17.0000 g | PACK | Freq: Two times a day (BID) | ORAL | 0 refills | Status: DC

## 2023-02-01 NOTE — Progress Notes (Signed)
Reviewed written d/c instructions w pt and all questions answered. She verbalized understanding. D/C via w/c w all belongings in stable condition. 

## 2023-02-01 NOTE — Plan of Care (Signed)

## 2023-02-01 NOTE — Discharge Summary (Addendum)
Physician Discharge Summary  Patient ID: Crystal Levy MRN: [id number] DOB/AGE: 02/08/1941 82 y.o.  Admit date: 01/29/2023 Discharge date: 02/01/2023  Admission Diagnoses: Rectal Prolapse  Discharge Diagnoses:  Principal Problem:   Rectal prolapse   Discharged Condition: good  Hospital Course: Patient was admitted to the med surg floor after surgery.  Diet was advanced as tolerated.  Patient began to have flatus on postop day 1.  By postop day 3, she was tolerating a solid diet and pain was controlled with oral medications.  She was urinating without difficulty and ambulating with walker assistance.  Patient was felt to be in stable condition for discharge to home.   Consults: None  Significant Diagnostic Studies: labs: cbc, bmet  Treatments: IV hydration, analgesia: acetaminophen, and surgery: Robotic Rectopexy  Discharge Exam: Blood pressure (!) 125/57, pulse (!) 59, temperature 97.8 F (36.6 C), temperature source Oral, resp. rate 18, height 5\' 5"  (1.651 m), weight 64.7 kg, SpO2 95 %. General appearance: alert and cooperative GI: soft, non-distended Incision/Wound: clean, dry, intact  Disposition: Discharge disposition: 01-Home or Self Care        Allergies as of 02/01/2023       Reactions   Clindamycin/lincomycin Diarrhea, Other (See Comments)   C-Diff   Fluticasone    Headache and nosebleed   Azithromycin    Unknown   Egg-derived Products    Per allergy testing   Milk-related Compounds    Per allergy testing   Penicillins Rash   Sulfa Antibiotics Diarrhea   Fenofibrate Rash        Medication List     TAKE these medications    benazepril 20 MG tablet Commonly known as: LOTENSIN TAKE ONE-HALF TABLET BY MOUTH IN THE MORNING AND 1 TABLET BY  MOUTH IN THE EVENING   citalopram 20 MG tablet Commonly known as: CELEXA TAKE 1 TABLET BY MOUTH DAILY   KELP PO Take 1 tablet by mouth daily.   levothyroxine 88 MCG tablet Commonly known as:  SYNTHROID TAKE 1 TABLET BY MOUTH DAILY  BEFORE BREAKFAST   LORazepam 1 MG tablet Commonly known as: ATIVAN TAKE 1 TABLET BY MOUTH AT BEDTIME AS NEEDED FOR ANXIETY   multivitamin with minerals Tabs tablet Take 1 tablet by mouth daily.   polyethylene glycol 17 g packet Commonly known as: MIRALAX / GLYCOLAX Take 17 g by mouth 2 (two) times daily.   rosuvastatin 5 MG tablet Commonly known as: CRESTOR Take 5 mg by mouth every Monday, Wednesday, and Friday.   SELENIMIN PO Take 1 tablet by mouth daily.   traMADol 50 MG tablet Commonly known as: ULTRAM Take 1-2 tablets (50-100 mg total) by mouth every 6 (six) hours as needed for moderate pain.   Vitamin D-3 25 MCG (1000 UT) Caps Take 1,000 Units by mouth daily.        Follow-up Information     Romie Levee, MD. Schedule an appointment as soon as possible for a visit in 2 week(s).   Specialties: General Surgery, Colon and Rectal Surgery Contact information: [address] [place] [address] [place] [address] [phone]                 Signed: Vanita Panda 02/01/2023, 7:57 AM

## 2023-02-01 NOTE — Discharge Instructions (Signed)
SURGERY: POST OP INSTRUCTIONS (Surgery for small bowel obstruction, colon resection, etc)   ######################################################################  EAT Gradually transition to a high fiber diet with a fiber supplement over the next few days after discharge  WALK Walk an hour a day.  Control your pain to do that.    CONTROL PAIN Control pain so that you can walk, sleep, tolerate sneezing/coughing, go up/down stairs.  HAVE A BOWEL MOVEMENT DAILY Keep your bowels regular to avoid problems.  OK to try a laxative to override constipation.  OK to use an antidairrheal to slow down diarrhea.  Call if not better after 2 tries  CALL IF YOU HAVE PROBLEMS/CONCERNS Call if you are still struggling despite following these instructions. Call if you have concerns not answered by these instructions  ######################################################################   DIET Follow a light diet the first few days at home.  Start with a bland diet such as soups, liquids, starchy foods, low fat foods, etc.  If you feel full, bloated, or constipated, stay on a ful liquid or pureed/blenderized diet for a few days until you feel better and no longer constipated. Be sure to drink plenty of fluids every day to avoid getting dehydrated (feeling dizzy, not urinating, etc.). Gradually add a fiber supplement to your diet over the next week.  Gradually get back to a regular solid diet.  Avoid fast food or heavy meals the first week as you are more likely to get nauseated. It is expected for your digestive tract to need a few months to get back to normal.  It is common for your bowel movements and stools to be irregular.  You will have occasional bloating and cramping that should eventually fade away.  Until you are eating solid food normally, off all pain medications, and back to regular activities; your bowels will not be normal. Focus on eating a low-fat, high fiber diet the rest of your life  (See Getting to Good Bowel Health, below).  CARE of your INCISION or WOUND  It is good for closed incisions and even open wounds to be washed every day.  Shower every day.  Short baths are fine.  Wash the incisions and wounds clean with soap & water.    You may leave closed incisions open to air if it is dry.   You may cover the incision with clean gauze & replace it after your daily shower for comfort.  STAPLES: You have skin staples.  Leave them in place & set up an appointment for them to be removed by a surgery office nurse ~10 days after surgery. = 1st week of January 2024    ACTIVITIES as tolerated Start light daily activities --- self-care, walking, climbing stairs-- beginning the day after surgery.  Gradually increase activities as tolerated.  Control your pain to be active.  Stop when you are tired.  Ideally, walk several times a day, eventually an hour a day.   Most people are back to most day-to-day activities in a few weeks.  It takes 4-8 weeks to get back to unrestricted, intense activity. If you can walk 30 minutes without difficulty, it is safe to try more intense activity such as jogging, treadmill, bicycling, low-impact aerobics, swimming, etc. Save the most intensive and strenuous activity for last (Usually 4-8 weeks after surgery) such as sit-ups, heavy lifting, contact sports, etc.  Refrain from any intense heavy lifting or straining until you are off narcotics for pain control.  You will have off days, but things should improve   week-by-week. DO NOT PUSH THROUGH PAIN.  Let pain be your guide: If it hurts to do something, don't do it.  Pain is your body warning you to avoid that activity for another week until the pain goes down. You may drive when you are no longer taking narcotic prescription pain medication, you can comfortably wear a seatbelt, and you can safely make sudden turns/stops to protect yourself without hesitating due to pain. You may have sexual intercourse when it  is comfortable. If it hurts to do something, stop.  MEDICATIONS Take your usually prescribed home medications unless otherwise directed.   Blood thinners:  Usually you can restart any strong blood thinners after the second postoperative day.  It is OK to take aspirin right away.     If you are on strong blood thinners (warfarin/Coumadin, Plavix, Xerelto, Eliquis, Pradaxa, etc), discuss with your surgeon, medicine PCP, and/or cardiologist for instructions on when to restart the blood thinner & if blood monitoring is needed (PT/INR blood check, etc).     PAIN CONTROL Pain after surgery or related to activity is often due to strain/injury to muscle, tendon, nerves and/or incisions.  This pain is usually short-term and will improve in a few months.  To help speed the process of healing and to get back to regular activity more quickly, DO THE FOLLOWING THINGS TOGETHER: Increase activity gradually.  DO NOT PUSH THROUGH PAIN Use Ice and/or Heat Try Gentle Massage and/or Stretching Take over the counter pain medication Take Narcotic prescription pain medication for more severe pain  Good pain control = faster recovery.  It is better to take more medicine to be more active than to stay in bed all day to avoid medications.  Increase activity gradually Avoid heavy lifting at first, then increase to lifting as tolerated over the next 6 weeks. Do not "push through" the pain.  Listen to your body and avoid positions and maneuvers than reproduce the pain.  Wait a few days before trying something more intense Walking an hour a day is encouraged to help your body recover faster and more safely.  Start slowly and stop when getting sore.  If you can walk 30 minutes without stopping or pain, you can try more intense activity (running, jogging, aerobics, cycling, swimming, treadmill, sex, sports, weightlifting, etc.) Remember: If it hurts to do it, then don't do it! Use Ice and/or Heat You will have swelling and  bruising around the incisions.  This will take several weeks to resolve. Ice packs or heating pads (6-8 times a day, 30-60 minutes at a time) will help sooth soreness & bruising. Some people prefer to use ice alone, heat alone, or alternate between ice & heat.  Experiment and see what works best for you.  Consider trying ice for the first few days to help decrease swelling and bruising; then, switch to heat to help relax sore spots and speed recovery. Shower every day.  Short baths are fine.  It feels good!  Keep the incisions and wounds clean with soap & water.   Try Gentle Massage and/or Stretching Massage at the area of pain many times a day Stop if you feel pain - do not overdo it Take over the counter pain medication This helps the muscle and nerve tissues become less irritable and calm down faster Choose ONE of the following over-the-counter anti-inflammatory medications: Acetaminophen 500mg tabs (Tylenol) 1-2 pills with every meal and just before bedtime (avoid if you have liver problems or if you have   acetaminophen in you narcotic prescription) Naproxen 220mg tabs (ex. Aleve, Naprosyn) 1-2 pills twice a day (avoid if you have kidney, stomach, IBD, or bleeding problems) Ibuprofen 200mg tabs (ex. Advil, Motrin) 3-4 pills with every meal and just before bedtime (avoid if you have kidney, stomach, IBD, or bleeding problems) Take with food/snack several times a day as directed for at least 2 weeks to help keep pain / soreness down & more manageable. Take Narcotic prescription pain medication for more severe pain A prescription for strong pain control is often given to you upon discharge (for example: oxycodone/Percocet, hydrocodone/Norco/Vicodin, or tramadol/Ultram) Take your pain medication as prescribed. Be mindful that most narcotic prescriptions contain Tylenol (acetaminophen) as well - avoid taking too much Tylenol. If you are having problems/concerns with the prescription medicine (does  not control pain, nausea, vomiting, rash, itching, etc.), please call us [phone] to see if we need to switch you to a different pain medicine that will work better for you and/or control your side effects better. If you need a refill on your pain medication, you must call the office before 4 pm and on weekdays only.  By federal law, prescriptions for narcotics cannot be called into a pharmacy.  They must be filled out on paper & picked up from our office by the patient or authorized caretaker.  Prescriptions cannot be filled after 4 pm nor on weekends.    WHEN TO CALL US [phone] Severe uncontrolled or worsening pain  Fever over 101 F (38.5 C) Concerns with the incision: Worsening pain, redness, rash/hives, swelling, bleeding, or drainage Reactions / problems with new medications (itching, rash, hives, nausea, etc.) Nausea and/or vomiting Difficulty urinating Difficulty breathing Worsening fatigue, dizziness, lightheadedness, blurred vision Other concerns If you are not getting better after two weeks or are noticing you are getting worse, contact our office [phone] for further advice.  We may need to adjust your medications, re-evaluate you in the office, send you to the emergency room, or see what other things we can do to help. The clinic staff is available to answer your questions during regular business hours (8:30am-5pm).  Please don't hesitate to call and ask to speak to one of our nurses for clinical concerns.    A surgeon from [facility] is always on call at the hospitals 24 hours/day If you have a medical emergency, go to the nearest emergency room or call 911.  FOLLOW UP in our office One the day of your discharge from the hospital (or the next business weekday), please call [facility] to set up or confirm an appointment to see your surgeon in the office for a follow-up appointment.  Usually it is 2-3 weeks after your surgery.   If you  have skin staples at your incision(s), let the office know so we can set up a time in the office for the nurse to remove them (usually around 10 days after surgery). Make sure that you call for appointments the day of discharge (or the next business weekday) from the hospital to ensure a convenient appointment time. IF YOU HAVE DISABILITY OR FAMILY LEAVE FORMS, BRING THEM TO THE OFFICE FOR PROCESSING.  DO NOT GIVE THEM TO YOUR DOCTOR.  [facility] [address], [address], , [place]  [address] ? [phone] - Main [phone] - Toll Free,  [phone] - Fax [email or web address]    GETTING TO GOOD BOWEL HEALTH. It is expected for your digestive tract to   need a few months to get back to normal.  It is common for your bowel movements and stools to be irregular.  You will have occasional bloating and cramping that should eventually fade away.  Until you are eating solid food normally, off all pain medications, and back to regular activities; your bowels will not be normal.   Avoiding constipation The goal: ONE SOFT BOWEL MOVEMENT A DAY!    Drink plenty of fluids.  Choose water first. TAKE A FIBER SUPPLEMENT EVERY DAY THE REST OF YOUR LIFE During your first week back home, gradually add back a fiber supplement every day Experiment which form you can tolerate.   There are many forms such as powders, tablets, wafers, gummies, etc Psyllium bran (Metamucil), methylcellulose (Citrucel), Miralax or Glycolax, Benefiber, Flax Seed.  Adjust the dose week-by-week (1/2 dose/day to 6 doses a day) until you are moving your bowels 1-2 times a day.  Cut back the dose or try a different fiber product if it is giving you problems such as diarrhea or bloating. Sometimes a laxative is needed to help jump-start bowels if constipated until the fiber supplement can help regulate your bowels.  If you are tolerating eating & you are farting, it is okay to try a gentle  laxative such as double dose MiraLax, prune juice, or Milk of Magnesia.  Avoid using laxatives too often. Stool softeners can sometimes help counteract the constipating effects of narcotic pain medicines.  It can also cause diarrhea, so avoid using for too long. If you are still constipated despite taking fiber daily, eating solids, and a few doses of laxatives, call our office. Controlling diarrhea Try drinking liquids and eating bland foods for a few days to avoid stressing your intestines further. Avoid dairy products (especially milk & ice cream) for a short time.  The intestines often can lose the ability to digest lactose when stressed. Avoid foods that cause gassiness or bloating.  Typical foods include beans and other legumes, cabbage, broccoli, and dairy foods.  Avoid greasy, spicy, fast foods.  Every person has some sensitivity to other foods, so listen to your body and avoid those foods that trigger problems for you. Probiotics (such as active yogurt, Align, etc) may help repopulate the intestines and colon with normal bacteria and calm down a sensitive digestive tract Adding a fiber supplement gradually can help thicken stools by absorbing excess fluid and retrain the intestines to act more normally.  Slowly increase the dose over a few weeks.  Too much fiber too soon can backfire and cause cramping & bloating. It is okay to try and slow down diarrhea with a few doses of antidiarrheal medicines.   Bismuth subsalicylate (ex. Kayopectate, Pepto Bismol) for a few doses can help control diarrhea.  Avoid if pregnant.   Loperamide (Imodium) can slow down diarrhea.  Start with one tablet (2mg) first.  Avoid if you are having fevers or severe pain.  ILEOSTOMY PATIENTS WILL HAVE CHRONIC DIARRHEA since their colon is not in use.    Drink plenty of liquids.  You will need to drink even more glasses of water/liquid a day to avoid getting dehydrated. Record output from your ileostomy.  Expect to empty  the bag every 3-4 hours at first.  Most people with a permanent ileostomy empty their bag 4-6 times at the least.   Use antidiarrheal medicine (especially Imodium) several times a day to avoid getting dehydrated.  Start with a dose at bedtime & breakfast.  Adjust up or   down as needed.  Increase antidiarrheal medications as directed to avoid emptying the bag more than 8 times a day (every 3 hours). Work with your wound ostomy nurse to learn care for your ostomy.  See ostomy care instructions. TROUBLESHOOTING IRREGULAR BOWELS 1) Start with a soft & bland diet. No spicy, greasy, or fried foods.  2) Avoid gluten/wheat or dairy products from diet to see if symptoms improve. 3) Miralax 17gm or flax seed mixed in 8oz. water or juice-daily. May use 2-4 times a day as needed. 4) Gas-X, Phazyme, etc. as needed for gas & bloating.  5) Prilosec (omeprazole) over-the-counter as needed 6)  Consider probiotics (Align, Activa, etc) to help calm the bowels down  Call your doctor if you are getting worse or not getting better.  Sometimes further testing (cultures, endoscopy, X-ray studies, CT scans, bloodwork, etc.) may be needed to help diagnose and treat the cause of the diarrhea. [facility] [address], [address], [place], [place]  [address] [phone] - Main.    [phone]  - Toll Free.   [phone] - Fax [email or web address]   ###############################   #######################################################  Ostomy Support Information  You've heard that people get along just fine with only one of their eyes, or one of their lungs, or one of their kidneys. But you also know that you have only one intestine and only one bladder, and that leaves you feeling awfully empty, both physically and emotionally: You think no other people go around without part of their intestine with the ends of their intestines sticking out through their abdominal walls.    YOU ARE NOT ALONE.  There are nearly three quarters of a million people in the [place] who have an ostomy; people who have had surgery to remove all or part of their colons or bladders.   There is even a national association, the [employer] with over 350 local affiliated support groups that are organized by volunteers who provide peer support and counseling. [employer] has a toll free telephone num-ber, 800-826-0826 and an educational, interactive website, www.ostomy.org   An ostomy is an opening in the belly (abdominal wall) made by surgery. Ostomates are people who have had this procedure. The opening (stoma) allows the kidney or bowel to grdischarge waste. An external pouch covers the stoma to collect waste. Pouches are are a simple bag and are odor free. Different companies have disposable or reusable pouches to fit one's lifestyle. An ostomy can either be temporary or permanent.   THERE ARE THREE MAIN TYPES OF OSTOMIES Colostomy. A colostomy is a surgically created opening in the large intestine (colon). Ileostomy. An ileostomy is a surgically created opening in the small intestine. Urostomy. A urostomy is a surgically created opening to divert urine away from the bladder.  OSTOMY Care  The following guidelines will make care of your colostomy easier. Keep this information close by for quick reference.  Helpful DIET hints Eat a well-balanced diet including vegetables and fresh fruits. Eat on a regular schedule.  Drink at least 6 to 8 glasses of fluids daily. Eat slowly in a relaxed atmosphere. Chew your food thoroughly. Avoid chewing gum, smoking, and drinking from a straw. This will help decrease the amount of air you swallow, which may help reduce gas. Eating yogurt or drinking buttermilk may help reduce gas.  To control gas at night, do not eat after 8 p.m. This will give your bowel time to quiet down before you go   to bed.  If gas is a problem, you can purchase  Beano. Sprinkle Beano on the first bite of food before eating to reduce gas. It has no flavor and should not change the taste of your food. You can buy Beano over the counter at your local drugstore.  Foods like fish, onions, garlic, broccoli, asparagus, and cabbage produce odor. Although your pouch is odor-proof, if you eat these foods you may notice a stronger odor when emptying your pouch. If this is a concern, you may want to limit these foods in your diet.  If you have an ileostomy, you will have chronic diarrhea & need to drink more liquids to avoid getting dehydrated.  Consider antidiarrheal medicine like imodium (loperamide) or Lomotil to help slow down bowel movements / diarrhea into your ileostomy bag.  GETTING TO GOOD BOWEL HEALTH WITH AN ILEOSTOMY    With the colon bypassed & not in use, you will have small bowel diarrhea.   It is important to thicken & slow your bowel movements down.   The goal: 4-6 small BOWEL MOVEMENTS A DAY It is important to drink plenty of liquids to avoid getting dehydrated  CONTROLLING ILEOSTOMY DIARRHEA  TAKE A FIBER SUPPLEMENT (FiberCon or Benefiner soluble fiber) twice a day - to thicken stools by absorbing excess fluid and retrain the intestines to act more normally.  Slowly increase the dose over a few weeks.  Too much fiber too soon can backfire and cause cramping & bloating.  TAKE AN IRON SUPPLEMENT twice a day to naturally constipate your bowels.  Usually ferrous sulfate 325mg twice a day)  TAKE ANTI-DIARRHEAL MEDICINES: Loperamide (Imodium) can slow down diarrhea.  Start with two tablets (= 4mg) first and then try one tablet every 6 hours.  Can go up to 2 pills four times day (8 pills of 2mg max) Avoid if you are having fevers or severe pain.  If you are not better or start feeling worse, stop all medicines and call your doctor for advice LoMotil (Diphenoxylate / Atropine) is another medicine that can constipate & slow down bowel moevements Pepto  Bismol (bismuth) can gently thicken bowels as well  If diarrhea is worse,: drink plenty of liquids and try simpler foods for a few days to avoid stressing your intestines further. Avoid dairy products (especially milk & ice cream) for a short time.  The intestines often can lose the ability to digest lactose when stressed. Avoid foods that cause gassiness or bloating.  Typical foods include beans and other legumes, cabbage, broccoli, and dairy foods.  Every person has some sensitivity to other foods, so listen to our body and avoid those foods that trigger problems for you.Call your doctor if you are getting worse or not better.  Sometimes further testing (cultures, endoscopy, X-ray studies, bloodwork, etc) may be needed to help diagnose and treat the cause of the diarrhea. Take extra anti-diarrheal medicines (maximum is 8 pills of 2mg loperamide a day)   Tips for POUCHING an OSTOMY   Changing Your Pouch The best time to change your pouch is in the morning, before eating or drinking anything. Your stoma can function at any time, but it will function more after eating or drinking.   Applying the pouching system  Place all your equipment close at hand before removing your pouch.  Wash your hands.  Stand or sit in front of a mirror. Use the position that works best for you. Remember that you must keep the skin around the stoma   wrinkle-free for a good seal.  Gently remove the used pouch (1-piece system) or the pouch and old wafer (2-piece system). Empty the pouch into the toilet. Save the closure clip to use again.  Wash the stoma itself and the skin around the stoma. Your stoma may bleed a little when being washed. This is normal. Rinse and pat dry. You may use a wash cloth or soft paper towels (like Bounty), mild soap (like Dial, Safeguard, or Ivory), and water. Avoid soaps that contain perfumes or lotions.  For a new pouch (1-piece system) or a new wafer (2-piece system), measure your  stoma using the stoma guide in each box of supplies.  Trace the shape of your stoma onto the back of the new pouch or the back of the new wafer. Cut out the opening. Remove the paper backing and set it aside.  Optional: Apply a skin barrier powder to surrounding skin if it is irritated (bare or weeping), and dust off the excess. Optional: Apply a skin-prep wipe (such as Skin Prep or All-Kare) to the skin around the stoma, and let it dry. Do not apply this solution if the skin is irritated (red, tender, or broken) or if you have shaved around the stoma. Optional: Apply a skin barrier paste (such as Stomahesive, Coloplast, or Premium) around the opening cut in the back of the pouch or wafer. Allow it to dry for 30 to 60 seconds.  Hold the pouch (1-piece system) or wafer (2-piece system) with the sticky side toward your body. Make sure the skin around the stoma is wrinkle-free. Center the opening on the stoma, then press firmly to your abdomen (Fig. 4). Look in the mirror to check if you are placing the pouch, or wafer, in the right position. For a 2-piece system, snap the pouch onto the wafer. Make sure it snaps into place securely.  Place your hand over the stoma and the pouch or wafer for about 30 seconds. The heat from your hand can help the pouch or wafer stick to your skin.  Add deodorant (such as Super Banish or Nullo) to your pouch. Other options include food extracts such as vanilla oil and peppermint extract. Add about 10 drops of the deodorant to the pouch. Then apply the closure clamp. Note: Do not use toxic  chemicals or commercial cleaning agents in your pouch. These substances may harm the stoma.  Optional: For extra seal, apply tape to all 4 sides around the pouch or wafer, as if you were framing a picture. You may use any brand of medical adhesive tape. Change your pouch every 5 to 7 days. Change it immediately if a leak occurs.  Wash your hands afterwards.  If you are wearing a  2-piece system, you may use 2 new pouches per week and alternate them. Rinse the pouch with mild soap and warm water and hang it to dry for the next day. Apply the fresh pouch. Alternate the 2 pouches like this for a week. After a week, change the wafer and begin with 2 new pouches. Place the old pouches in a plastic bag, and put them in the trash.   LIVING WITH AN OSTOMY  Emptying Your Pouch Empty your pouch when it is one-third full (of urine, stool, and/or gas). If you wait until your pouch is fuller than this, it will be more difficult to empty and more noticeable. When you empty your pouch, either put toilet paper in the toilet bowl first, or flush the   toilet while you empty the pouch. This will reduce splashing. You can empty the pouch between your legs or to one side while sitting, or while standing or stooping. If you have a 2-piece system, you can snap off the pouch to empty it. Remember that your stoma may function during this time. If you wish to rinse your pouch after you empty it, a turkey baster can be helpful. When using a baster, squirt water up into the pouch through the opening at the bottom. With a 2-piece system, you can snap off the pouch to rinse it. After rinsing  your pouch, empty it into the toilet. When rinsing your pouch at home, put a few granules of Dreft soap in the rinse water. This helps lubricate and freshen your pouch. The inside of your pouch can be sprayed with non-stick cooking oil (Pam spray). This may help reduce stool sticking to the inside of the pouch.  Bathing You may shower or bathe with your pouch on or off. Remember that your stoma may function during this time.  The materials you use to wash your stoma and the skin around it should be clean, but they do not need to be sterile.  Wearing Your Pouch During hot weather, or if you perspire a lot in general, wear a cover over your pouch. This may prevent a rash on your skin under the pouch. Pouch covers are  sold at ostomy supply stores. Wear the pouch inside your underwear for better support. Watch your weight. Any gain or loss of 10 to 15 pounds or more can change the way your pouch fits.  Going Away From Home A collapsible cup (like those that come in travel kits) or a soft plastic squirt bottle with a pull-up top (like a travel bottle for shampoo) can be used for rinsing your pouch when you are away from home. Tilt the opening of the pouch at an upward angle when using a cup to rinse.  Carry wet wipes or extra tissues to use in public bathrooms.  Carry an extra pouching system with you at all times.  Never keep ostomy supplies in the glove compartment of your car. Extreme heat or cold can damage the skin barriers and adhesive wafers on the pouch.  When you travel, carry your ostomy supplies with you at all times. Keep them within easy reach. Do not pack ostomy supplies in baggage that will be checked or otherwise separated from you, because your baggage might be lost. If you're traveling out of the country, it is helpful to have a letter stating that you are carrying ostomy supplies as a medical necessity.  If you need ostomy supplies while traveling, look in the yellow pages of the telephone book under "Surgical Supplies." Or call the local ostomy organization to find out where supplies are available.  Do not let your ostomy supplies get low. Always order new pouches before you use the last one.  Reducing Odor Limit foods such as broccoli, cabbage, onions, fish, and garlic in your diet to help reduce odor. Each time you empty your pouch, carefully clean the opening of the pouch, both inside and outside, with toilet paper. Rinse your pouch 1 or 2 times daily after you empty it (see directions for emptying your pouch and going away from home). Add deodorant (such as Super Banish or Nullo) to your pouch. Use air deodorizers in your bathroom. Do not add aspirin to your pouch. Even though  aspirin can help prevent odor, it   could cause ulcers on your stoma.  When to call the doctor Call the doctor if you have any of the following symptoms: Purple, black, or white stoma Severe cramps lasting more than 6 hours Severe watery discharge from the stoma lasting more than 6 hours No output from the colostomy for 3 days Excessive bleeding from your stoma Swelling of your stoma to more than 1/2-inch larger than usual Pulling inward of your stoma below skin level Severe skin irritation or deep ulcers Bulging or other changes in your abdomen  When to call your ostomy nurse Call your ostomy/enterostomal therapy (WOCN) nurse if any of the following occurs: Frequent leaking of your pouching system Change in size or appearance of your stoma, causing discomfort or problems with your pouch Skin rash or rawness Weight gain or loss that causes problems with your pouch     FREQUENTLY ASKED QUESTIONS   Why haven't you met any of these folks who have an ostomy?  Well, maybe you have! You just did not recognize them because an ostomy doesn't show. It can be kept secret if you wish. Why, maybe some of your best friends, office associates or neighbors have an ostomy ... you never can tell. People facing ostomy surgery have many quality-of-life questions like: Will you bulge? Smell? Make noises? Will you feel waste leaving your body? Will you be a captive of the toilet? Will you starve? Be a social outcast? Get/stay married? Have babies? Easily bathe, go swimming, bend over?  OK, let's look at what you can expect:   Will you bulge?  Remember, without part of the intestine or bladder, and its contents, you should have a flatter tummy than before. You can expect to wear, with little exception, what you wore before surgery ... and this in-cludes tight clothing and bathing suits.   Will you smell?  Today, thanks to modern odor proof pouching systems, you can walk into an ostomy support group  meeting and not smell anything that is foul or offensive. And, for those with an ileostomy or colostomy who are concerned about odor when emptying their pouch, there are in-pouch deodorants that can be used to eliminate any waste odors that may exist.   Will you make noises?  Everyone produces gas, especially if they are an air-swallower. But intestinal sounds that occur from time to time are no differ-ent than a gurgling tummy, and quite often your clothing will muffle any sounds.   Will you feel the waste discharges?  For those with a colostomy or ileostomy there might be a slight pressure when waste leaves your body, but understand that the intestines have no nerve endings, so there will be no unpleasant sensations. Those with a urostomy will probably be unaware of any kidney drainage.   Will you be a captive of the toilet?  Immediately post-op you will spend more time in the bathroom than you will after your body recovers from surgery. Every person is different, but on average those with an ileostomy or urostomy may empty their pouches 4 to 6 times a day; a little  less if you have a colostomy. The average wear time between pouch system changes is 3 to 5 days and the changing process should take less than 30 minutes.   Will I need to be on a special diet? Most people return to their normal diet when they have recovered from surgery. Be sure to chew your food well, eat a well-balanced diet and drink plenty of fluids. If   you experience problems with a certain food, wait a couple of weeks and try it again.  Will there be odor and noises? Pouching systems are designed to be odor-proof or odor-resistant. There are deodorants that can be used in the pouch. Medications are also available to help reduce odor. Limit gas-producing foods and carbonated beverages. You will experience less gas and fewer noises as you heal from surgery.  How much time will it take to care for my ostomy? At first, you may  spend a lot of time learning about your ostomy and how to take care of it. As you become more comfortable and skilled at changing the pouching system, it will take very little time to care for it.   Will I be able to return to work? People with ostomies can perform most jobs. As soon as you have healed from surgery, you should be able to return to work. Heavy lifting (more than 10 pounds) may be discouraged.   What about intimacy? Sexual relationships and intimacy are important and fulfilling aspects of your life. They should continue after ostomy surgery. Intimacy-related concerns should be discussed openly between you and your partner.   Can I wear regular clothing? You do not need to wear special clothing. Ostomy pouches are fairly flat and barely noticeable. Elastic undergarments will not hurt the stoma or prevent the ostomy from functioning.   Can I participate in sports? An ostomy should not limit your involvement in sports. Many people with ostomies are runners, skiers, swimmers or participate in other active lifestyles. Talk with your caregiver first before doing heavy physical activity.  Will you starve?  Not if you follow doctor's orders at each stage of your post-op adjustment. There is no such thing as an "ostomy diet". Some people with an ostomy will be able to eat and tolerate anything; others may find diffi-culty with some foods. Each person is an individual and must determine, by trial, what is best for them. A good practice for all is to drink plenty of water.   Will you be a social outcast?  Have you met anyone who has an ostomy and is a social outcast? Why should you be the first? Only your attitude and self image will effect how you are treated. No confi-dent person is an outcast.    PROFESSIONAL HELP   Resources are available if you need help or have questions about your ostomy.   Specially trained nurses called Wound, Ostomy Continence Nurses (WOCN) are available for  consultation in most major medical centers.  Consider getting an ostomy consult at an outpatient ostomy clinic.   [place] has an Ostomy Clinic run by an WOCN ostomy nurse at the [facility].  [phone]. [facility] can help set up an appointment   The United Ostomy Association (UOA) is a group made up of many local chapters throughout the [place]. These local groups hold meetings and provide support to prospective and existing ostomates. They sponsor educational events and have qualified visitors to make personal or telephone visits. Contact the UOA for the chapter nearest you and for other educational publications.  More detailed information can be found in Colostomy Guide, a publication of the United Ostomy Association (UOA). Contact UOA at 1-800-826-0826 or visit their web site at www.uoaa.org. The website contains links to other sites, suppliers and resources.  [facility] Secure Start Services: Start at the website to enlist for support.  Your Wound Ostomy (WOCN) nurse may have started this   process. https://www.[employer].com/en/securestart Secure Start services are designed to support people as they live their lives with an ostomy or neurogenic bladder. Enrolling is easy and at no cost to the patient. We realize that each person's needs and life journey are different. Through Secure Start services, we want to help people live their life, their way.  #######################################################  

## 2023-02-04 ENCOUNTER — Telehealth: Payer: Self-pay

## 2023-02-04 ENCOUNTER — Ambulatory Visit: Payer: Self-pay | Admitting: *Deleted

## 2023-02-04 NOTE — Transitions of Care (Post Inpatient/ED Visit) (Signed)
   02/04/2023  Name: Crystal Levy MRN: [id number] DOB: 1940/10/04  Today's TOC FU Call Status: Today's TOC FU Call Status:: Unsuccessul Call (1st Attempt) Unsuccessful Call (1st Attempt) Date: 02/04/23  Attempted to reach the patient regarding the most recent Inpatient/ED visit.  Follow Up Plan: Additional outreach attempts will be made to reach the patient to complete the Transitions of Care (Post Inpatient/ED visit) call.     Antionette Fairy, RN,BSN,CCM [facility] Care Management Care Management Community Coordinator Direct Phone: [phone] Toll Free: [phone] Fax: [phone]

## 2023-02-04 NOTE — Chronic Care Management (AMB) (Signed)
   02/04/2023  Crystal Levy 12/07/1940 [id number]   Patient is not participating in Chronic Care Management (CCM) services, status changed to previously enrolled.  Irving Shows [facility], BSN [facility] RN Care Manager [phone]

## 2023-02-05 ENCOUNTER — Telehealth: Payer: Self-pay

## 2023-02-05 NOTE — Transitions of Care (Post Inpatient/ED Visit) (Signed)
02/05/2023  Name: Crystal Levy MRN: [id number] DOB: November 20, 1940  Today's TOC FU Call Status: Today's TOC FU Call Status:: Successful TOC FU Call Competed TOC FU Call Complete Date: 02/05/23  Transition Care Management Follow-up Telephone Call Date of Discharge: 02/01/23 Discharge Facility: [facility]) Type of Discharge: Inpatient Admission Primary Inpatient Discharge Diagnosis:: "rectal prolapse" How have you been since you were released from the hospital?: Better (Pt pleased at how well she has been doing. Pain is controlled-2/10 at presnt-has not taken anything today. Appetite is "so-so." She reports she has been having regular BMs.) Any questions or concerns?: No  Items Reviewed: Did you receive and understand the discharge instructions provided?: Yes Medications obtained,verified, and reconciled?: Yes (Medications Reviewed) Any new allergies since your discharge?: No Dietary orders reviewed?: Yes Type of Diet Ordered:: low salt/heart healthy Do you have support at home?: Yes People in Home: alone Name of Support/Comfort Primary Source: pt has family and friends assisting her while she recovers  Medications Reviewed Today: Medications Reviewed Today     Reviewed by Charlyn Minerva, RN (Registered Nurse) on 02/05/23 at [phone]  Med List Status: <None>   Medication Order Taking? Sig Documenting Provider Last Dose Status Informant  benazepril (LOTENSIN) 20 MG tablet [id number] Yes TAKE ONE-HALF TABLET BY MOUTH IN THE MORNING AND 1 TABLET BY  MOUTH IN THE Jobe Gibbon [place], Betty G, MD Taking Active Self, Pharmacy Records  Cholecalciferol (VITAMIN D-3) 1000 UNITS CAPS [id number] Yes Take 1,000 Units by mouth daily. [provider] Taking Active Self, Pharmacy Records  citalopram (CELEXA) 20 MG tablet [id number] Yes TAKE 1 TABLET BY MOUTH DAILY [place], Betty G, MD Taking Active Self, Pharmacy Records  Iodine, Kelp, (KELP PO) [id number] Yes Take 1 tablet by mouth  daily. [provider] Taking Active Self, Pharmacy Records  levothyroxine (SYNTHROID) 88 MCG tablet [id number] Yes TAKE 1 TABLET BY MOUTH DAILY  BEFORE BREAKFAST [place], Betty G, MD Taking Active Self, Pharmacy Records  LORazepam (ATIVAN) 1 MG tablet [id number] Yes TAKE 1 TABLET BY MOUTH AT BEDTIME AS NEEDED FOR ANXIETY [place], Betty G, MD Taking Active Self, Pharmacy Records  magnesium hydroxide (MILK OF MAGNESIA) 400 MG/5ML suspension [id number] Yes Take 5 mLs by mouth daily as needed for mild constipation. [provider] Taking Active Self  Multiple Vitamin (MULTIVITAMIN WITH MINERALS) TABS tablet [id number] Yes Take 1 tablet by mouth daily. [provider] Taking Active Self, Pharmacy Records  polyethylene glycol (MIRALAX / GLYCOLAX) 17 g packet [id number] Yes Take 17 g by mouth 2 (two) times daily. Romie Levee, MD Taking Active   rosuvastatin (CRESTOR) 5 MG tablet [id number] Yes Take 5 mg by mouth every Monday, Wednesday, and Friday. [provider] Taking Active Self, Pharmacy Records  Selenium (SELENIMIN PO) [id number] Yes Take 1 tablet by mouth daily. [provider] Taking Active Self, Pharmacy Records  traMADol (ULTRAM) 50 MG tablet [id number] Yes Take 1-2 tablets (50-100 mg total) by mouth every 6 (six) hours as needed for moderate pain. Romie Levee, MD Taking Active             Home Care and Equipment/Supplies: Were Home Health Services Ordered?: NA Any new equipment or medical supplies ordered?: NA  Functional Questionnaire: Do you need assistance with bathing/showering or dressing?: No Do you need assistance with meal preparation?: No Do you need assistance with eating?: No Do you have difficulty maintaining continence: No Do you need assistance with getting out of bed/getting out of a chair/moving?: No Do you  have difficulty managing or taking your medications?: No  Follow up appointments reviewed: PCP Follow-up appointment  confirmed?: Yes Date of PCP follow-up appointment?: 02/28/23 (care guide assisted with making PCP appt-pt did not want to sre MD until after she completes surgeon appt and requested appt on specified date of 6/21-appt scheduled) Follow-up Provider: [facility]. [place] [facility] Follow-up appointment confirmed?: Yes Date of Specialist follow-up appointment?: 02/24/23 Follow-Up Specialty Provider:: Dr. Maisie Fus Do you need transportation to your follow-up appointment?: No (pt confirms she can get family or friends to take her to follow up appt until medically cleared to resume driving)  SDOH Interventions Today    Flowsheet Row Most Recent Value  SDOH Interventions   Food Insecurity Interventions Intervention Not Indicated  Transportation Interventions Intervention Not Indicated      TOC Interventions Today    Flowsheet Row Most Recent Value  TOC Interventions   TOC Interventions Discussed/Reviewed Arranged PCP follow up less than 12 days/Care Guide scheduled, TOC Interventions Discussed, Post discharge activity limitations per provider, Post op wound/incision care, S/S of infection  [pt reports she has been applying Neosporin to sugrical site-reviewed wound/incision care with pt per d/c instructions-advised pt to discuss with surgeon about applying ointment-she will call office]      Interventions Today    Flowsheet Row Most Recent Value  General Interventions   General Interventions Discussed/Reviewed General Interventions Discussed, Doctor Visits  Doctor Visits Discussed/Reviewed Doctor Visits Discussed, Specialist, PCP  PCP/Specialist Visits Compliance with follow-up visit  Education Interventions   Education Provided Provided Education  Provided Verbal Education On Nutrition, When to see the doctor, Medication  Nutrition Interventions   Nutrition Discussed/Reviewed Nutrition Discussed, Adding fruits and vegetables, Decreasing salt  Pharmacy Interventions   Pharmacy  Dicussed/Reviewed Pharmacy Topics Discussed, Medications and their functions  Safety Interventions   Safety Discussed/Reviewed Safety Discussed       Alessandra Grout [facility] Care Management Care Management Community Coordinator Direct Phone: [phone] Toll Free: [phone] Fax: [phone]

## 2023-02-15 ENCOUNTER — Other Ambulatory Visit: Payer: Self-pay | Admitting: Family Medicine

## 2023-02-18 ENCOUNTER — Telehealth: Payer: Self-pay

## 2023-02-18 NOTE — Progress Notes (Signed)
Patient ID: Crystal Levy, female   DOB: [birth year], 82 y.o.   MRN: [id number]  Care Management & Coordination Services Pharmacy Team  Reason for Encounter: General adherence update   Contacted patient for general health update and medication adherence call.  Spoke with patient on 02/18/2023    What concerns do you have about your medications? Patient reports none  The patient denies side effects with their medications.   How often do you forget or accidentally miss a dose? Rarely  Do you use a pillbox? No  Are you having any problems getting your medications from your pharmacy? No  Has the cost of your medications been a concern? No   The patient has not had an ED visit since last contact. Patient reports she is still healing up and getting stronger after her surgery, is still walking and being active isnt cleared to drive as of yet but is ready.  The patient denies problems with their health.   Patient denies concerns or questions for Milas Kocher, PharmD at this time.   Counseled patient on: Great job taking medications and Access to carecoordination team for any cost, medication or pharmacy concerns.     Chart Updates:  Recent office visits:   11/29/22 [place], Betty G, MD - Patient presented for Bilateral groin pain and other concerns. No medication changes.  11/04/22 [place], Betty G, MD - Patient presented for Irritable bowel syndrome with constipation and diarrhea and other concerns. No medication changes.  Recent consult visits:  01/14/23 Patient presented to [facility] for preadmission testing.  12/24/22 Elenora Gamma, MD (Surg) - Patient presented for initial consult. No medication changes.  10/24/22 Jill Side (Optometry) - Claims encounter for Keratoconjunctivitis sicca and other concerns. No other visit details available.   Hospital visits:  Medication Reconciliation was completed by comparing discharge summary, patient's EMR and Pharmacy  list, and upon discussion with patient.  Patient presented to [facility] on 01/29/23 due to rectal prolapse. Patient was present for 3 days.  New?Medications Started at [facility] Discharge:?? -started polyethylene glycol (MIRALAX / GLYCOLAX) traMADol Janean Sark  Medication Changes at Hospital Discharge: -Changed  none  Medications Discontinued at Hospital Discharge: -Stopped  none  Medications that remain the same after Hospital Discharge:??  -All other medications will remain the same.    Medications: Outpatient Encounter Medications as of 02/18/2023  Medication Sig   benazepril (LOTENSIN) 20 MG tablet TAKE ONE-HALF TABLET BY MOUTH IN THE MORNING AND 1 TABLET BY  MOUTH IN THE EVENING   Cholecalciferol (VITAMIN D-3) 1000 UNITS CAPS Take 1,000 Units by mouth daily.   citalopram (CELEXA) 20 MG tablet TAKE 1 TABLET BY MOUTH DAILY   Iodine, Kelp, (KELP PO) Take 1 tablet by mouth daily.   levothyroxine (SYNTHROID) 88 MCG tablet TAKE 1 TABLET BY MOUTH DAILY  BEFORE BREAKFAST   LORazepam (ATIVAN) 1 MG tablet TAKE 1 TABLET BY MOUTH AT BEDTIME AS NEEDED FOR ANXIETY   magnesium hydroxide (MILK OF MAGNESIA) 400 MG/5ML suspension Take 5 mLs by mouth daily as needed for mild constipation.   Multiple Vitamin (MULTIVITAMIN WITH MINERALS) TABS tablet Take 1 tablet by mouth daily.   polyethylene glycol (MIRALAX / GLYCOLAX) 17 g packet Take 17 g by mouth 2 (two) times daily.   rosuvastatin (CRESTOR) 5 MG tablet TAKE 1 TABLET BY MOUTH DAILY   Selenium (SELENIMIN PO) Take 1 tablet by mouth daily.   traMADol (ULTRAM) 50 MG tablet Take 1-2 tablets (50-100 mg total) by  mouth every 6 (six) hours as needed for moderate pain.   No facility-administered encounter medications on file as of 02/18/2023.    Recent vitals BP Readings from Last 3 Encounters:  02/01/23 (!) 125/57  01/14/23 136/60  11/29/22 128/70   Pulse Readings from Last 3 Encounters:  02/01/23 (!) 59  01/14/23 73  11/29/22 80    Wt Readings from Last 3 Encounters:  02/01/23 142 lb 10.2 oz (64.7 kg)  01/14/23 139 lb (63 kg)  11/29/22 139 lb 8 oz (63.3 kg)   BMI Readings from Last 3 Encounters:  02/01/23 23.74 kg/m  01/14/23 23.13 kg/m  11/29/22 23.21 kg/m    Recent lab results    Component Value Date/Time   NA 137 01/30/2023 0439   K 4.5 01/30/2023 0439   CL 105 01/30/2023 0439   CO2 27 01/30/2023 0439   GLUCOSE 170 (H) 01/30/2023 0439   BUN 9 01/30/2023 0439   CREATININE 0.60 01/30/2023 0439   CALCIUM 8.9 01/30/2023 0439    Lab Results  Component Value Date   CREATININE 0.60 01/30/2023   GFR 83.41 01/07/2022   GFRNONAA >60 01/30/2023   GFRAA >90 12/09/2014   No results found for: "HGBA1C", "FRUCTOSAMINE", "MICROALBUR"  Lab Results  Component Value Date   CHOL 196 01/07/2022   HDL 58.50 01/07/2022   LDLCALC 110 (H) 01/07/2022   LDLDIRECT 111.0 12/26/2020   TRIG 140.0 01/07/2022   CHOLHDL 3 01/07/2022    Care Gaps: COVID Booster - Overdue AWV - 02/15/22  Star Rating Drugs:  Benazepril 20 mg - Last filled 01/04/23 100 DS at [facility] Rosuvastatin 5 mg - Last filled 01/19/23 60 DS at [facility]     Pamala Duffel CMA Clinical Pharmacist Assistant [phone]

## 2023-02-26 NOTE — Progress Notes (Unsigned)
HPI: [name] is a 82 y.o. 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 She was admitted on 01/29/2023 after undergoing robotic assisted rectopexy to correct rectal prolapse surgery and discharged home on 02/01/2023. For pain management she was prescribed tramadol 50 mg 1 to 2 tablets every 6 hours, she took for a week then transition to Tylenol for pain management. Follow up appt with Dr Maisie Fus 5 days ago, no further follow up was recommended unless she develops problems. She maintains regular bowel movements with the assistance of daily Miralax and milk of Magnesia every four days. Having 1-2 daily bowel movements.  She has been recovering at home, avoiding heavy lifting and engaging in daily walks, initially with the aid of a walker and now with a cane. Still experiencing some fatigue. She denies experiencing any fever, abdominal pain, nausea, or vomiting and started advancing her diet. She has recently incorporated fresh fruit and beans into her diet.  She keeps track of her water intake, aiming to drink approximately seven glasses of water daily.  Her glucose was mildly elevated the day after the surgery, no hx of diabetes.  HLD: She is currently taking rosuvastatin three times per week, tolerating medication well. She had headache when she took medication daily. Lab Results  Component Value Date   CHOL 196 01/07/2022   HDL 58.50 01/07/2022   LDLCALC 110 (H) 01/07/2022   LDLDIRECT 111.0 12/26/2020   TRIG 140.0 01/07/2022   CHOLHDL 3 01/07/2022   Hypertension: Today BP on lower normal range. Home BP's 120-130/70's. She is on Benazepril 20 mg am and 1/2 tab pm. Negative for unusual or severe headache, visual changes, exertional chest pain, dyspnea,  new focal weakness, or edema.  Lab Results  Component Value Date   CREATININE 0.60 01/30/2023   BUN 9 01/30/2023   NA  137 01/30/2023   K 4.5 01/30/2023   CL 105 01/30/2023   CO2 27 01/30/2023   Lab Results  Component Value Date   WBC 12.5 (H) 01/30/2023   HGB 12.7 01/30/2023   HCT 39.2 01/30/2023   MCV 90.3 01/30/2023   PLT 256 01/30/2023   Hypothyroidism on Levothyroxine 88 mcg daily. Lab Results  Component Value Date   TSH 2.23 01/07/2022   Review of Systems  Constitutional:  Negative for chills and unexpected weight change.  HENT:  Negative for mouth sores and sore throat.   Endocrine: Negative for cold intolerance and heat intolerance.  Genitourinary:  Negative for decreased urine volume, dysuria and hematuria.  Skin:  Negative for rash.  Neurological:  Negative for syncope and facial asymmetry.  See other pertinent positives and negatives in HPI.  Current Outpatient Medications on File Prior to Visit  Medication Sig Dispense Refill   benazepril (LOTENSIN) 20 MG tablet TAKE ONE-HALF TABLET BY MOUTH IN THE MORNING AND 1 TABLET BY  MOUTH IN THE EVENING 150 tablet 1   Cholecalciferol (VITAMIN D-3) 1000 UNITS CAPS Take 1,000 Units by mouth daily.     citalopram (CELEXA) 20 MG tablet TAKE 1 TABLET BY MOUTH DAILY 90 tablet 3   Iodine, Kelp, (KELP PO) Take 1 tablet by mouth daily.     levothyroxine (SYNTHROID) 88 MCG tablet TAKE 1 TABLET BY MOUTH DAILY  BEFORE BREAKFAST 100 tablet 2   LORazepam (ATIVAN) 1 MG tablet TAKE 1 TABLET BY MOUTH AT BEDTIME AS NEEDED FOR ANXIETY 30 tablet 3  magnesium hydroxide (MILK OF MAGNESIA) 400 MG/5ML suspension Take 5 mLs by mouth daily as needed for mild constipation.     Multiple Vitamin (MULTIVITAMIN WITH MINERALS) TABS tablet Take 1 tablet by mouth daily.     polyethylene glycol (MIRALAX / GLYCOLAX) 17 g packet Take 17 g by mouth 2 (two) times daily. 60 each 0   rosuvastatin (CRESTOR) 5 MG tablet TAKE 1 TABLET BY MOUTH DAILY 60 tablet 5   Selenium (SELENIMIN PO) Take 1 tablet by mouth daily.     No current facility-administered medications on file prior to  visit.    Past Medical History:  Diagnosis Date   Anxiety    Arthritis    Bicuspid aortic valve    Depression    Heart murmur    Hypertension    Hypothyroidism    PAD (peripheral artery disease) (HCC)    Palpitations    Pneumonia    Polio    Thyroid disease    Allergies  Allergen Reactions   Clindamycin/Lincomycin Diarrhea and Other (See Comments)    C-Diff   Fluticasone     Headache and nosebleed    Azithromycin     Unknown   Egg-Derived Products     Per allergy testing   Milk-Related Compounds     Per allergy testing   Penicillins Rash   Sulfa Antibiotics Diarrhea   Fenofibrate Rash    Social History   Socioeconomic History   Marital status: Divorced    Spouse name: Not on file   Number of children: 2   Years of education: Not on file   Highest education level: Master's degree (e.g., MA, MS, MEng, MEd, MSW, MBA)  Occupational History    Employer: [employer]  Tobacco Use   Smoking status: Never   Smokeless tobacco: Never   Tobacco comments:    Rare smoker in the distant past.   Vaping Use   Vaping Use: Never used  Substance and Sexual Activity   Alcohol use: Yes    Alcohol/week: 1.0 standard drink of alcohol    Types: 1 Glasses of wine per week    Comment: wine 3 times per week   Drug use: No   Sexual activity: Not on file  Other Topics Concern   Not on file  Social History Narrative   Lives alone. 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    Social Determinants of Health   Financial Resource Strain: Low Risk  (02/24/2023)   Overall Financial Resource Strain (CARDIA)    Difficulty of Paying Living Expenses: Not very hard  Food Insecurity: No Food Insecurity (02/24/2023)   Hunger Vital Sign    Worried About Running Out of Food in the Last Year: Never true    Ran Out of Food in the Last Year: Never true  Transportation Needs: No Transportation Needs (02/24/2023)   PRAPARE - Administrator, Civil Service (Medical): No    Lack of  Transportation (Non-Medical): No  Physical Activity: Sufficiently Active (02/24/2023)   Exercise Vital Sign    Days of Exercise per Week: 7 days    Minutes of Exercise per Session: 30 min  Stress: No Stress Concern Present (02/24/2023)   [employer] of Occupational Health - Occupational Stress Questionnaire    Feeling of Stress : Only a little  Social Connections: Moderately Isolated (02/24/2023)   Social Connection and Isolation Panel [NHANES]    Frequency of Communication with Friends and Family: More than three times a week  Frequency of Social Gatherings with Friends and Family: Twice a week    Attends Religious Services: Never    Database administrator or Organizations: Yes    Attends Engineer, structural: More than 4 times per year    Marital Status: Divorced    Vitals:   02/28/23 1012  BP: 106/60  Pulse: 80  Resp: 16  Temp: 99.1 F (37.3 C)  SpO2: 97%   Body mass index is 23.15 kg/m.  Physical Exam Vitals and nursing note reviewed.  Constitutional:      General: She is not in acute distress.    Appearance: She is well-developed.  HENT:     Head: Normocephalic and atraumatic.     Mouth/Throat:     Mouth: Mucous membranes are moist.     Pharynx: Oropharynx is clear.  Eyes:     Conjunctiva/sclera: Conjunctivae normal.  Cardiovascular:     Rate and Rhythm: Normal rate and regular rhythm.     Heart sounds: Murmur (SEM I/VI RUSB) heard.     Comments: DP pulses palpable. Pulmonary:     Effort: Pulmonary effort is normal. No respiratory distress.     Breath sounds: Normal breath sounds.  Abdominal:     Palpations: Abdomen is soft. There is no hepatomegaly or mass.     Tenderness: There is no abdominal tenderness.     Comments: Surgical laparoscopic wounds healing well.  Skin:    General: Skin is warm.     Findings: No erythema or rash.  Neurological:     Mental Status: She is alert and oriented to person, place, and time.     Cranial Nerves: No  cranial nerve deficit.     Comments: Right LE weakness,brace on. Antalgic gait, assisted with a cane.  Psychiatric:        Mood and Affect: Mood and affect normal.   ASSESSMENT AND PLAN:  Ms. Zayana was seen today for hospitalization follow-up.  Diagnoses and all orders for this visit:  Hyperlipidemia, unspecified hyperlipidemia type Assessment & Plan: LDL 110 in 01/2022. She is tolerating rosuvastatin 10 mg 3 times per week, daily medication caused headaches.  No changes today. She is not fasting today, so we will plan on fasting lipid panel in 3 months.   Hypothyroidism (acquired) Assessment & Plan: Last TSH in 01/2022 was 2.2. We will plan on blood work next visit, 3 months. Continue levothyroxine 88 mcg daily.   Rectal prolapse Assessment & Plan: S/p robotic assisted rectopexy on 01/29/2023. She is recovering well, no further follow-up with surgeon were recommended. Continue OTC MiraLAX, adequate hydration, and fiber intake. Avoid straining or prolonged sitting on toilet.   Primary hypertension Assessment & Plan: BP today on the lower normal range. For now continue benazepril 20 mg 1.5 tablets daily and low salt diet. Recommend checking BP regularly. Follow-up in 3 months.   Hyperglycemia, glucose 170 during hospitalization. We will plan on adding HgA1C to blood work next visit.  Return in about 3 months (around 05/31/2023) for chronic problems.  Dionysios Massman G. [place], MD  [facility]. [place] [facility].

## 2023-02-28 ENCOUNTER — Encounter: Payer: Self-pay | Admitting: Family Medicine

## 2023-02-28 ENCOUNTER — Ambulatory Visit (INDEPENDENT_AMBULATORY_CARE_PROVIDER_SITE_OTHER): Payer: Medicare Other | Admitting: Family Medicine

## 2023-02-28 VITALS — BP 106/60 | HR 80 | Temp 99.1°F | Resp 16 | Ht 65.0 in | Wt 139.1 lb

## 2023-02-28 DIAGNOSIS — E785 Hyperlipidemia, unspecified: Secondary | ICD-10-CM | POA: Diagnosis not present

## 2023-02-28 DIAGNOSIS — E039 Hypothyroidism, unspecified: Secondary | ICD-10-CM

## 2023-02-28 DIAGNOSIS — K623 Rectal prolapse: Secondary | ICD-10-CM | POA: Diagnosis not present

## 2023-02-28 DIAGNOSIS — I10 Essential (primary) hypertension: Secondary | ICD-10-CM

## 2023-02-28 NOTE — Assessment & Plan Note (Signed)
BP today on the lower normal range. For now continue benazepril 20 mg 1.5 tablets daily and low salt diet. Recommend checking BP regularly. Follow-up in 3 months.

## 2023-02-28 NOTE — Patient Instructions (Addendum)
A few things to remember from today's visit:  Primary hypertension  Hypothyroidism (acquired)  Hyperlipidemia, unspecified hyperlipidemia type  No changes today. Fasting abs next visit. Monitor blood pressure at home.  If you need refills for medications you take chronically, please call your pharmacy. Do not use My Chart to request refills or for acute issues that need immediate attention. If you send a my chart message, it may take a few days to be addressed, specially if I am not in the office.  Please be sure medication list is accurate. If a new problem present, please set up appointment sooner than planned today.

## 2023-02-28 NOTE — Assessment & Plan Note (Signed)
S/p robotic assisted rectopexy on 01/29/2023. She is recovering well, no further follow-up with surgeon were recommended. Continue OTC MiraLAX, adequate hydration, and fiber intake. Avoid straining or prolonged sitting on toilet.

## 2023-02-28 NOTE — Assessment & Plan Note (Signed)
LDL 110 in 01/2022. She is tolerating rosuvastatin 10 mg 3 times per week, daily medication caused headaches.  No changes today. She is not fasting today, so we will plan on fasting lipid panel in 3 months.

## 2023-02-28 NOTE — Assessment & Plan Note (Signed)
Last TSH in 01/2022 was 2.2. We will plan on blood work next visit, 3 months. Continue levothyroxine 88 mcg daily.

## 2023-03-10 ENCOUNTER — Other Ambulatory Visit: Payer: Self-pay | Admitting: Family Medicine

## 2023-03-11 ENCOUNTER — Other Ambulatory Visit: Payer: Self-pay | Admitting: Family Medicine

## 2023-03-17 DIAGNOSIS — M545 Low back pain, unspecified: Secondary | ICD-10-CM | POA: Diagnosis not present

## 2023-03-17 DIAGNOSIS — M7062 Trochanteric bursitis, left hip: Secondary | ICD-10-CM | POA: Diagnosis not present

## 2023-04-11 ENCOUNTER — Other Ambulatory Visit: Payer: Self-pay | Admitting: Family Medicine

## 2023-04-11 DIAGNOSIS — F419 Anxiety disorder, unspecified: Secondary | ICD-10-CM

## 2023-04-22 ENCOUNTER — Telehealth: Payer: Medicare Other | Admitting: Family Medicine

## 2023-04-22 ENCOUNTER — Telehealth: Payer: Self-pay | Admitting: Family Medicine

## 2023-04-22 NOTE — Telephone Encounter (Signed)
Regarding levothyroxine (SYNTHROID) 88 MCG tablet, Wants to know if a manufacture change is permitted. ref # [id number]

## 2023-04-23 ENCOUNTER — Ambulatory Visit (INDEPENDENT_AMBULATORY_CARE_PROVIDER_SITE_OTHER): Payer: Medicare Other

## 2023-04-23 VITALS — BP 118/62 | HR 68 | Temp 98.4°F | Ht 65.0 in | Wt 142.7 lb

## 2023-04-23 DIAGNOSIS — Z0000 Encounter for general adult medical examination without abnormal findings: Secondary | ICD-10-CM

## 2023-04-23 NOTE — Progress Notes (Signed)
Subjective:   Crystal Levy is a 82 y.o. female who presents for Medicare Annual (Subsequent) preventive examination.  Visit Complete: In person  Patient Medicare AWV questionnaire was completed by the patient on  ; I have confirmed that all information answered by patient is correct and no changes since this date.  Review of Systems     Cardiac Risk Factors include: advanced age (>46men, >93 women);hypertension     Objective:    Today's Vitals   04/23/23 1426  BP: 118/62  Pulse: 68  Temp: 98.4 F (36.9 C)  TempSrc: Oral  SpO2: 97%  Weight: 142 lb 11.2 oz (64.7 kg)  Height: 5\' 5"  (1.651 m)   Body mass index is 23.75 kg/m.     04/23/2023    2:51 PM 01/29/2023   10:07 AM 01/14/2023    2:21 PM 02/15/2022   10:55 AM 02/14/2021   11:27 AM 12/21/2014    2:39 PM 12/09/2014    9:07 AM  Advanced Directives  Does Patient Have a Medical Advance Directive? Yes Yes Yes Yes Yes Yes Yes  Type of Estate agent of [place];Living will Healthcare Power of [place];Living will Living will;Healthcare Power of [employer] Power of [place];Living will Healthcare Power of [place];Living will Healthcare Power of [place];Living will   Does patient want to make changes to medical advance directive? No - Patient declined No - Guardian declined  No - Patient declined  No - Patient declined   Copy of Healthcare Power of Attorney in Chart? Yes - validated most recent copy scanned in chart (See row information) No - copy requested  Yes - validated most recent copy scanned in chart (See row information) No - copy requested No - copy requested No - copy requested    Current Medications (verified) Outpatient Encounter Medications as of 04/23/2023  Medication Sig   benazepril (LOTENSIN) 20 MG tablet TAKE ONE-HALF TABLET BY MOUTH IN THE MORNING AND 1 TABLET BY  MOUTH IN THE EVENING   Cholecalciferol (VITAMIN D-3) 1000 UNITS CAPS Take 1,000 Units by mouth daily.   citalopram  (CELEXA) 20 MG tablet TAKE 1 TABLET BY MOUTH DAILY   Iodine, Kelp, (KELP PO) Take 1 tablet by mouth daily.   levothyroxine (SYNTHROID) 88 MCG tablet TAKE 1 TABLET BY MOUTH DAILY  BEFORE BREAKFAST   LORazepam (ATIVAN) 1 MG tablet TAKE 1 TABLET BY MOUTH AT BEDTIME AS NEEDED FOR ANXIETY   magnesium hydroxide (MILK OF MAGNESIA) 400 MG/5ML suspension Take 5 mLs by mouth daily as needed for mild constipation.   Multiple Vitamin (MULTIVITAMIN WITH MINERALS) TABS tablet Take 1 tablet by mouth daily.   polyethylene glycol (MIRALAX / GLYCOLAX) 17 g packet Take 17 g by mouth 2 (two) times daily.   rosuvastatin (CRESTOR) 5 MG tablet TAKE 1 TABLET BY MOUTH DAILY   Selenium (SELENIMIN PO) Take 1 tablet by mouth daily.   No facility-administered encounter medications on file as of 04/23/2023.    Allergies (verified) Clindamycin/lincomycin, Fluticasone, Azithromycin, Egg-derived products, Milk-related compounds, Penicillins, Sulfa antibiotics, and Fenofibrate   History: Past Medical History:  Diagnosis Date   Anxiety    Arthritis    Bicuspid aortic valve    Depression    Heart murmur    Hypertension    Hypothyroidism    PAD (peripheral artery disease) (HCC)    Palpitations    Pneumonia    Polio    Thyroid disease    Past Surgical History:  Procedure Laterality Date   ABDOMINAL  HYSTERECTOMY     BREAST ENHANCEMENT SURGERY     BREAST SURGERY     BUNIONECTOMY Left    CARDIAC CATHETERIZATION     EYE SURGERY     HAMMER TOE SURGERY Right    HERNIA REPAIR     JOINT REPLACEMENT     left hip   KNEE SURGERY     meniscus   RADICAL VAGINAL HYSTERECTOMY  09/10/1991   TONSILLECTOMY     TOTAL KNEE ARTHROPLASTY Left 12/20/2014   Procedure: LEFT TOTAL KNEE ARTHROPLASTY;  Surgeon: Marcene Corning, MD;  Location: [facility] OR;  Service: Orthopedics;  Laterality: Left;   XI ROBOT ASSISTED RECTOPEXY N/A 01/29/2023   Procedure: XI ROBOT ASSISTED RECTOPEXY;  Surgeon: Romie Levee, MD;  Location: [facility];  Service:  General;  Laterality: N/A;   Family History  Problem Relation Age of Onset   CAD Mother 25   Aortic stenosis Mother    Social History   Socioeconomic History   Marital status: Divorced    Spouse name: Not on file   Number of children: 2   Years of education: Not on file   Highest education level: Master's degree (e.g., MA, MS, MEng, MEd, MSW, MBA)  Occupational History    Employer: [employer]  Tobacco Use   Smoking status: Never   Smokeless tobacco: Never   Tobacco comments:    Rare smoker in the distant past.   Vaping Use   Vaping status: Never Used  Substance and Sexual Activity   Alcohol use: Yes    Alcohol/week: 1.0 standard drink of alcohol    Types: 1 Glasses of wine per week    Comment: wine 3 times per week   Drug use: No   Sexual activity: Not on file  Other Topics Concern   Not on file  Social History Narrative   Lives alone. 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    Social Determinants of Health   Financial Resource Strain: Low Risk  (04/23/2023)   Overall Financial Resource Strain (CARDIA)    Difficulty of Paying Living Expenses: Not hard at all  Food Insecurity: No Food Insecurity (04/23/2023)   Hunger Vital Sign    Worried About Running Out of Food in the Last Year: Never true    Ran Out of Food in the Last Year: Never true  Transportation Needs: No Transportation Needs (04/23/2023)   PRAPARE - Administrator, Civil Service (Medical): No    Lack of Transportation (Non-Medical): No  Physical Activity: Sufficiently Active (04/23/2023)   Exercise Vital Sign    Days of Exercise per Week: 7 days    Minutes of Exercise per Session: 30 min  Stress: No Stress Concern Present (04/23/2023)   [employer] of Occupational Health - Occupational Stress Questionnaire    Feeling of Stress : Not at all  Social Connections: Moderately Integrated (04/23/2023)   Social Connection and Isolation Panel [NHANES]    Frequency of Communication with  Friends and Family: More than three times a week    Frequency of Social Gatherings with Friends and Family: More than three times a week    Attends Religious Services: More than 4 times per year    Active Member of [employer] or Organizations: Yes    Attends Banker Meetings: More than 4 times per year    Marital Status: Divorced  Recent Concern: Social Connections - Moderately Isolated (02/24/2023)   Social Connection and Isolation Panel [NHANES]    Frequency of Communication with  Friends and Family: More than three times a week    Frequency of Social Gatherings with Friends and Family: Twice a week    Attends Religious Services: Never    Database administrator or Organizations: Yes    Attends Engineer, structural: More than 4 times per year    Marital Status: Divorced    Tobacco Counseling Counseling given: Not Answered Tobacco comments: Rare smoker in the distant past.    Clinical Intake:  Pre-visit preparation completed: No  Pain : No/denies pain     BMI - recorded: 23.75 Nutritional Status: BMI of 19-24  Normal Nutritional Risks: None Diabetes: No  How often do you need to have someone help you when you read instructions, pamphlets, or other written materials from your doctor or pharmacy?: 1 - Never  Interpreter Needed?: No  Information entered by :: Theresa Mulligan LPN   Activities of Daily Living    04/23/2023    2:48 PM 01/29/2023    5:00 PM  In your present state of health, do you have any difficulty performing the following activities:  Hearing? 0 0  Vision? 0 0  Difficulty concentrating or making decisions? 0 0  Walking or climbing stairs? 1 0  Comment Wears leg brace.   Dressing or bathing? 0 0  Doing errands, shopping? 0 0  Preparing Food and eating ? N   Using the Toilet? N   In the past six months, have you accidently leaked urine? Y   Comment Wears pads.Followed by PCP   Do you have problems with loss of bowel control? Y    Comment DX: IBS Followed by PCP   Managing your Medications? N   Managing your Finances? N   Housekeeping or managing your Housekeeping? N     Patient Care Team: [place], Betty G, MD as PCP - General (Family Medicine) Verner Chol, [facility] (Inactive) as Pharmacist (Pharmacist)  Indicate any recent Medical Services you may have received from other than [facility] providers in the past year (date may be approximate).     Assessment:   This is a routine wellness examination for [place].  Hearing/Vision screen Hearing Screening - Comments:: Denies hearing difficulties   Vision Screening - Comments:: Wears rx glasses - up to date with routine eye exams with  [facility]  Dietary issues and exercise activities discussed:     Goals Addressed               This Visit's Progress     Lose weight (pt-stated)        I want walk better.       Depression Screen    04/23/2023    2:25 PM 11/04/2022   11:59 AM 05/08/2022   11:56 AM 02/15/2022   10:49 AM 10/10/2021   12:41 PM 02/14/2021   11:34 AM 02/14/2021   11:27 AM  PHQ 2/9 Scores  PHQ - 2 Score 0 0 2 0 0 0 0  PHQ- 9 Score   5  5      Fall Risk    04/23/2023    2:50 PM 11/04/2022   11:59 AM 05/08/2022   11:56 AM 02/15/2022   10:53 AM 01/03/2022    4:43 PM  Fall Risk   Falls in the past year? 0 0 0 0 1  Number falls in past yr: 0 0 0 0 1  Injury with Fall? 0 0 0 0 1  Risk for fall due to : No Fall  Risks Other (Comment) Other (Comment) No Fall Risks   Follow up Falls prevention discussed Falls evaluation completed Falls evaluation completed      MEDICARE RISK AT HOME:  Medicare Risk at Home - 04/23/23 1453     Any stairs in or around the home? No    If so, are there any without handrails? No    Home free of loose throw rugs in walkways, pet beds, electrical cords, etc? Yes    Adequate lighting in your home to reduce risk of falls? Yes    Life alert? No    Use of a cane, walker or w/c? Yes    Grab bars in the bathroom?  Yes    Shower chair or bench in shower? No    Elevated toilet seat or a handicapped toilet? Yes             TIMED UP AND GO:  Was the test performed?  Yes  Length of time to ambulate 10 feet: 10 sec Gait slow and steady without use of assistive device    Cognitive Function:        04/23/2023    2:51 PM 02/15/2022   10:55 AM  6CIT Screen  What Year? 0 points 0 points  What month? 0 points 0 points  What time? 0 points 0 points  Count back from 20 0 points 0 points  Months in reverse 0 points 0 points  Repeat phrase 0 points 0 points  Total Score 0 points 0 points    Immunizations Immunization History  Administered Date(s) Administered   Fluad Quad(high Dose 65+) 05/28/2021, 07/18/2022   Influenza Split 09/07/2018, 04/28/2019   Influenza, High Dose Seasonal PF 08/31/2018, 06/05/2020   PFIZER(Purple Top)SARS-COV-2 Vaccination 09/10/2019, 10/21/2019, 06/19/2020, 12/08/2020   Pfizer Covid-19 Vaccine Bivalent Booster 52yrs & up 06/22/2021, 07/01/2022   Pneumococcal Conjugate-13 01/27/2014   Pneumococcal Polysaccharide-23 09/10/2007   Pneumococcal-Unspecified 09/09/2008   Respiratory Syncytial Virus Vaccine,Recomb Aduvanted(Arexvy) 05/15/2022   Tdap 09/09/2009, 01/16/2010   Zoster Recombinant(Shingrix) 12/26/2016, 04/12/2017   Zoster, Live 09/09/2009, 03/15/2013      Flu Vaccine status: Up to date  Pneumococcal vaccine status: Up to date  Covid-19 vaccine status: Declined, Education has been provided regarding the importance of this vaccine but patient still declined. Advised may receive this vaccine at local pharmacy or Health Dept.or vaccine clinic. Aware to provide a copy of the vaccination record if obtained from local pharmacy or Health Dept. Verbalized acceptance and understanding.  Qualifies for Shingles Vaccine? Yes   Zostavax completed Yes   Shingrix Completed?: Yes  Screening Tests Health Maintenance  Topic Date Due   COVID-19 Vaccine (7 - 2023-24  season) 08/26/2022   INFLUENZA VACCINE  04/10/2023   Medicare Annual Wellness (AWV)  04/22/2024   Pneumonia Vaccine 62+ Years old  Completed   DEXA SCAN  Completed   Zoster Vaccines- Shingrix  Completed   HPV VACCINES  Aged Out   DTaP/Tdap/Td  Discontinued   Hepatitis C Screening  Discontinued    Health Maintenance  Health Maintenance Due  Topic Date Due   COVID-19 Vaccine (7 - 2023-24 season) 08/26/2022   INFLUENZA VACCINE  04/10/2023    Colorectal cancer screening: No longer required.   Mammogram status: No longer required due to Age.  Bone Density status: Completed 11/10/20. Results reflect: Bone density results: OSTEOPENIA. Repeat every   years.  Lung Cancer Screening: (Low Dose CT Chest recommended if Age 75-80 years, 20 pack-year currently smoking OR have quit w/in 15years.)  does not qualify.    Additional Screening:  Hepatitis C Screening: does not qualify; Completed   Vision Screening: Recommended annual ophthalmology exams for early detection of glaucoma and other disorders of the eye. Is the patient up to date with their annual eye exam?  Yes  Who is the provider or what is the name of the office in which the patient attends annual eye exams? [facility] If pt is not established with a provider, would they like to be referred to a provider to establish care? No .   Dental Screening: Recommended annual dental exams for proper oral hygiene    Community Resource Referral / Chronic Care Management:  CRR required this visit?  No   CCM required this visit?  No     Plan:     I have personally reviewed and noted the following in the patient's chart:   Medical and social history Use of alcohol, tobacco or illicit drugs  Current medications and supplements including opioid prescriptions. Patient is not currently taking opioid prescriptions. Functional ability and status Nutritional status Physical activity Advanced directives List of other  physicians Hospitalizations, surgeries, and ER visits in previous 12 months Vitals Screenings to include cognitive, depression, and falls Referrals and appointments  In addition, I have reviewed and discussed with patient certain preventive protocols, quality metrics, and best practice recommendations. A written personalized care plan for preventive services as well as general preventive health recommendations were provided to patient.     EMMILEE ORSI, LPN   5/62/1308   After Visit Summary: Given  Nurse Notes: None

## 2023-04-23 NOTE — Patient Instructions (Addendum)
[name] , Thank you for taking time to come for your Medicare Wellness Visit. I appreciate your ongoing commitment to your health goals. Please review the following plan we discussed and let me know if I can assist you in the future.   Referrals/Orders/Follow-Ups/Clinician Recommendations:   This is a list of the screening recommended for you and due dates:  Health Maintenance  Topic Date Due   COVID-19 Vaccine (7 - 2023-24 season) 08/26/2022   Flu Shot  04/10/2023   Medicare Annual Wellness Visit  04/22/2024   Pneumonia Vaccine  Completed   DEXA scan (bone density measurement)  Completed   Zoster (Shingles) Vaccine  Completed   HPV Vaccine  Aged Out   DTaP/Tdap/Td vaccine  Discontinued   Hepatitis C Screening  Discontinued    Advanced directives: (In Chart) A copy of your advanced directives are scanned into your chart should your provider ever need it.  Next Medicare Annual Wellness Visit scheduled for next year: Yes  Preventive Care 39 Years and Older, Female Preventive care refers to lifestyle choices and visits with your health care provider that can promote health and wellness. What does preventive care include? A yearly physical exam. This is also called an annual well check. Dental exams once or twice a year. Routine eye exams. Ask your health care provider how often you should have your eyes checked. Personal lifestyle choices, including: Daily care of your teeth and gums. Regular physical activity. Eating a healthy diet. Avoiding tobacco and drug use. Limiting alcohol use. Practicing safe sex. Taking low-dose aspirin every day. Taking vitamin and mineral supplements as recommended by your health care provider. What happens during an annual well check? The services and screenings done by your health care provider during your annual well check will depend on your age, overall health, lifestyle risk factors, and family history of disease. Counseling  Your health  care provider may ask you questions about your: Alcohol use. Tobacco use. Drug use. Emotional well-being. Home and relationship well-being. Sexual activity. Eating habits. History of falls. Memory and ability to understand (cognition). Work and work Astronomer. Reproductive health. Screening  You may have the following tests or measurements: Height, weight, and BMI. Blood pressure. Lipid and cholesterol levels. These may be checked every 5 years, or more frequently if you are over 32 years old. Skin check. Lung cancer screening. You may have this screening every year starting at age 45 if you have a 30-pack-year history of smoking and currently smoke or have quit within the past 15 years. Fecal occult blood test (FOBT) of the stool. You may have this test every year starting at age 89. Flexible sigmoidoscopy or colonoscopy. You may have a sigmoidoscopy every 5 years or a colonoscopy every 10 years starting at age 61. Hepatitis C blood test. Hepatitis B blood test. Sexually transmitted disease (STD) testing. Diabetes screening. This is done by checking your blood sugar (glucose) after you have not eaten for a while (fasting). You may have this done every 1-3 years. Bone density scan. This is done to screen for osteoporosis. You may have this done starting at age 59. Mammogram. This may be done every 1-2 years. Talk to your health care provider about how often you should have regular mammograms. Talk with your health care provider about your test results, treatment options, and if necessary, the need for more tests. Vaccines  Your health care provider may recommend certain vaccines, such as: Influenza vaccine. This is recommended every year. Tetanus, diphtheria, and  acellular pertussis (Tdap, Td) vaccine. You may need a Td booster every 10 years. Zoster vaccine. You may need this after age 12. Pneumococcal 13-valent conjugate (PCV13) vaccine. One dose is recommended after age  57. Pneumococcal polysaccharide (PPSV23) vaccine. One dose is recommended after age 73. Talk to your health care provider about which screenings and vaccines you need and how often you need them. This information is not intended to replace advice given to you by your health care provider. Make sure you discuss any questions you have with your health care provider. Document Released: 09/22/2015 Document Revised: 05/15/2016 Document Reviewed: 06/27/2015 Elsevier Interactive Patient Education  2017 [employer].  Fall Prevention in the Home Falls can cause injuries. They can happen to people of all ages. There are many things you can do to make your home safe and to help prevent falls. What can I do on the outside of my home? Regularly fix the edges of walkways and driveways and fix any cracks. Remove anything that might make you trip as you walk through a door, such as a raised step or threshold. Trim any bushes or trees on the path to your home. Use bright outdoor lighting. Clear any walking paths of anything that might make someone trip, such as rocks or tools. Regularly check to see if handrails are loose or broken. Make sure that both sides of any steps have handrails. Any raised decks and porches should have guardrails on the edges. Have any leaves, snow, or ice cleared regularly. Use sand or salt on walking paths during winter. Clean up any spills in your garage right away. This includes oil or grease spills. What can I do in the bathroom? Use night lights. Install grab bars by the toilet and in the tub and shower. Do not use towel bars as grab bars. Use non-skid mats or decals in the tub or shower. If you need to sit down in the shower, use a plastic, non-slip stool. Keep the floor dry. Clean up any water that spills on the floor as soon as it happens. Remove soap buildup in the tub or shower regularly. Attach bath mats securely with double-sided non-slip rug tape. Do not have throw  rugs and other things on the floor that can make you trip. What can I do in the bedroom? Use night lights. Make sure that you have a light by your bed that is easy to reach. Do not use any sheets or blankets that are too big for your bed. They should not hang down onto the floor. Have a firm chair that has side arms. You can use this for support while you get dressed. Do not have throw rugs and other things on the floor that can make you trip. What can I do in the kitchen? Clean up any spills right away. Avoid walking on wet floors. Keep items that you use a lot in easy-to-reach places. If you need to reach something above you, use a strong step stool that has a grab bar. Keep electrical cords out of the way. Do not use floor polish or wax that makes floors slippery. If you must use wax, use non-skid floor wax. Do not have throw rugs and other things on the floor that can make you trip. What can I do with my stairs? Do not leave any items on the stairs. Make sure that there are handrails on both sides of the stairs and use them. Fix handrails that are broken or loose. Make sure that  handrails are as long as the stairways. Check any carpeting to make sure that it is firmly attached to the stairs. Fix any carpet that is loose or worn. Avoid having throw rugs at the top or bottom of the stairs. If you do have throw rugs, attach them to the floor with carpet tape. Make sure that you have a light switch at the top of the stairs and the bottom of the stairs. If you do not have them, ask someone to add them for you. What else can I do to help prevent falls? Wear shoes that: Do not have high heels. Have rubber bottoms. Are comfortable and fit you well. Are closed at the toe. Do not wear sandals. If you use a stepladder: Make sure that it is fully opened. Do not climb a closed stepladder. Make sure that both sides of the stepladder are locked into place. Ask someone to hold it for you, if  possible. Clearly mark and make sure that you can see: Any grab bars or handrails. First and last steps. Where the edge of each step is. Use tools that help you move around (mobility aids) if they are needed. These include: Canes. Walkers. Scooters. Crutches. Turn on the lights when you go into a dark area. Replace any light bulbs as soon as they burn out. Set up your furniture so you have a clear path. Avoid moving your furniture around. If any of your floors are uneven, fix them. If there are any pets around you, be aware of where they are. Review your medicines with your doctor. Some medicines can make you feel dizzy. This can increase your chance of falling. Ask your doctor what other things that you can do to help prevent falls. This information is not intended to replace advice given to you by your health care provider. Make sure you discuss any questions you have with your health care provider. Document Released: 06/22/2009 Document Revised: 02/01/2016 Document Reviewed: 09/30/2014 Elsevier Interactive Patient Education  2017 [employer].

## 2023-04-24 ENCOUNTER — Encounter (INDEPENDENT_AMBULATORY_CARE_PROVIDER_SITE_OTHER): Payer: Self-pay

## 2023-04-28 NOTE — Telephone Encounter (Signed)
Spoke with pharmacist, they are aware.

## 2023-04-28 NOTE — Telephone Encounter (Signed)
Can you advise if okay to change manufacturers?

## 2023-04-29 DIAGNOSIS — M7062 Trochanteric bursitis, left hip: Secondary | ICD-10-CM | POA: Diagnosis not present

## 2023-05-07 DIAGNOSIS — S76312D Strain of muscle, fascia and tendon of the posterior muscle group at thigh level, left thigh, subsequent encounter: Secondary | ICD-10-CM | POA: Diagnosis not present

## 2023-05-07 DIAGNOSIS — S335XXD Sprain of ligaments of lumbar spine, subsequent encounter: Secondary | ICD-10-CM | POA: Diagnosis not present

## 2023-05-07 DIAGNOSIS — S233XXD Sprain of ligaments of thoracic spine, subsequent encounter: Secondary | ICD-10-CM | POA: Diagnosis not present

## 2023-05-16 DIAGNOSIS — S335XXD Sprain of ligaments of lumbar spine, subsequent encounter: Secondary | ICD-10-CM | POA: Diagnosis not present

## 2023-05-16 DIAGNOSIS — S76312D Strain of muscle, fascia and tendon of the posterior muscle group at thigh level, left thigh, subsequent encounter: Secondary | ICD-10-CM | POA: Diagnosis not present

## 2023-05-16 DIAGNOSIS — S233XXD Sprain of ligaments of thoracic spine, subsequent encounter: Secondary | ICD-10-CM | POA: Diagnosis not present

## 2023-05-20 DIAGNOSIS — S233XXD Sprain of ligaments of thoracic spine, subsequent encounter: Secondary | ICD-10-CM | POA: Diagnosis not present

## 2023-05-20 DIAGNOSIS — S76312D Strain of muscle, fascia and tendon of the posterior muscle group at thigh level, left thigh, subsequent encounter: Secondary | ICD-10-CM | POA: Diagnosis not present

## 2023-05-20 DIAGNOSIS — S335XXD Sprain of ligaments of lumbar spine, subsequent encounter: Secondary | ICD-10-CM | POA: Diagnosis not present

## 2023-05-22 DIAGNOSIS — H16223 Keratoconjunctivitis sicca, not specified as Sjogren's, bilateral: Secondary | ICD-10-CM | POA: Diagnosis not present

## 2023-05-23 DIAGNOSIS — S76312D Strain of muscle, fascia and tendon of the posterior muscle group at thigh level, left thigh, subsequent encounter: Secondary | ICD-10-CM | POA: Diagnosis not present

## 2023-05-23 DIAGNOSIS — S335XXD Sprain of ligaments of lumbar spine, subsequent encounter: Secondary | ICD-10-CM | POA: Diagnosis not present

## 2023-05-23 DIAGNOSIS — S233XXD Sprain of ligaments of thoracic spine, subsequent encounter: Secondary | ICD-10-CM | POA: Diagnosis not present

## 2023-05-27 DIAGNOSIS — S233XXD Sprain of ligaments of thoracic spine, subsequent encounter: Secondary | ICD-10-CM | POA: Diagnosis not present

## 2023-05-27 DIAGNOSIS — S76312D Strain of muscle, fascia and tendon of the posterior muscle group at thigh level, left thigh, subsequent encounter: Secondary | ICD-10-CM | POA: Diagnosis not present

## 2023-05-27 DIAGNOSIS — S335XXD Sprain of ligaments of lumbar spine, subsequent encounter: Secondary | ICD-10-CM | POA: Diagnosis not present

## 2023-05-30 ENCOUNTER — Ambulatory Visit: Payer: Medicare Other | Admitting: Family Medicine

## 2023-06-04 ENCOUNTER — Ambulatory Visit: Payer: Medicare Other | Admitting: Family Medicine

## 2023-06-04 ENCOUNTER — Ambulatory Visit: Payer: Medicare Other

## 2023-06-04 ENCOUNTER — Encounter: Payer: Self-pay | Admitting: Family Medicine

## 2023-06-04 VITALS — BP 120/70 | HR 70 | Temp 98.7°F | Resp 16 | Ht 65.0 in | Wt 141.4 lb

## 2023-06-04 DIAGNOSIS — I10 Essential (primary) hypertension: Secondary | ICD-10-CM

## 2023-06-04 DIAGNOSIS — R42 Dizziness and giddiness: Secondary | ICD-10-CM

## 2023-06-04 DIAGNOSIS — E039 Hypothyroidism, unspecified: Secondary | ICD-10-CM

## 2023-06-04 DIAGNOSIS — F419 Anxiety disorder, unspecified: Secondary | ICD-10-CM | POA: Diagnosis not present

## 2023-06-04 DIAGNOSIS — R0602 Shortness of breath: Secondary | ICD-10-CM | POA: Diagnosis not present

## 2023-06-04 DIAGNOSIS — R0789 Other chest pain: Secondary | ICD-10-CM

## 2023-06-04 DIAGNOSIS — R7303 Prediabetes: Secondary | ICD-10-CM | POA: Insufficient documentation

## 2023-06-04 DIAGNOSIS — R519 Headache, unspecified: Secondary | ICD-10-CM

## 2023-06-04 DIAGNOSIS — E785 Hyperlipidemia, unspecified: Secondary | ICD-10-CM | POA: Diagnosis not present

## 2023-06-04 DIAGNOSIS — R918 Other nonspecific abnormal finding of lung field: Secondary | ICD-10-CM | POA: Diagnosis not present

## 2023-06-04 DIAGNOSIS — I700 Atherosclerosis of aorta: Secondary | ICD-10-CM | POA: Diagnosis not present

## 2023-06-04 LAB — POCT GLYCOSYLATED HEMOGLOBIN (HGB A1C): HbA1c, POC (prediabetic range): 5.9 % (ref 5.7–6.4)

## 2023-06-04 NOTE — Assessment & Plan Note (Signed)
Glucose elevated in 01/2023, 170. Hemoglobin A1c today 5.9. Encourage consistency with a healthy lifestyle for diabetes prevention.

## 2023-06-04 NOTE — Assessment & Plan Note (Signed)
BP adequately controlled. Continue benazepril 20 mg 1.5 tablets daily and low salt diet. Continue monitoring BP regularly. Follow-up in 4 months, before if needed.

## 2023-06-04 NOTE — Patient Instructions (Addendum)
A few things to remember from today's visit:  Elevated glucose level - Plan: POC HgB A1c  Primary hypertension - Plan: Comprehensive metabolic panel  Anxiety disorder, unspecified type  Lightheadedness - Plan: CBC  Headache, unspecified headache type - Plan: C-reactive protein  Hyperlipidemia, unspecified hyperlipidemia type - Plan: Lipid panel  Other chest pain - Plan: DG Chest 2 View  SOB (shortness of breath) - Plan: DG Chest 2 View  Hypothyroidism (acquired) - Plan: T4, free, TSH  If shortness of breath, headache,or shortness of chest pain get suddenly worse you need to seek immediate medical attention.  If you need refills for medications you take chronically, please call your pharmacy. Do not use My Chart to request refills or for acute issues that need immediate attention. If you send a my chart message, it may take a few days to be addressed, specially if I am not in the office.  Please be sure medication list is accurate. If a new problem present, please set up appointment sooner than planned today.

## 2023-06-04 NOTE — Assessment & Plan Note (Signed)
Stable overall. Continue Celexa 20 mg daily and lorazepam 1 mg daily at bedtime. F/U in 4 months.

## 2023-06-04 NOTE — Assessment & Plan Note (Signed)
Problem has been adequately controlled. Last TSH in 01/2022 was 2.2. Continue levothyroxine 88 mcg daily. TSH to be done tomorrow.

## 2023-06-04 NOTE — Progress Notes (Signed)
HPI: Crystal Levy is a 82 y.o. female with a PMHx significant for HTN, anxiety, PAD, OA, and hx of polio with residual right LE weakness, who is here today for recent fever and headaches.  Last seen on 02/28/2023  Patient reports she has been having right temporal headache for 2-3 weeks and fever since 9/12. She rates the headache as a 4-5/10. No associated nausea, vomiting,or photophobia.  Head CT done in 06/2021: Negative. Cervical CT in 04/2022:Multilevel degenerative change without acute abnormality.   She also endorses associated new lightheadedness, decreased concentration, LE's weakness,  bilateral upper chest pain, and brief instances of earache, sore throat, and night sweats.  She is not sure about symptoms duration, started checking temp 05/22/23 and gradually going up, max 99.8, last temp 99.6. Knee OA, following with ortho.  Bilateral upper check pain, under clavicles, not graduated and no associated palpitations or diaphoresis. She has not identified exacerbating or alleviating factors. Has been intermittently for about 2 months. Notice it in the morning when she wakes up and lasts hours.  She denies worsening neck pain, episodes of confusion/MS changes , cough, wheezing, diet changes, urinary problems, changes in sleep, body aches, or known sick contacts.  She has been taking tylenol. She took 2 Covid tests and both were negative.   -HTN:She has been checking her BP at home. Most readings have been 120s-130s/60s. Some systolic readings have been in the 140s.  Currently she is on benazepril 20 mg 1.5 tablet daily. Lab Results  Component Value Date   NA 137 01/30/2023   CL 105 01/30/2023   K 4.5 01/30/2023   CO2 27 01/30/2023   BUN 9 01/30/2023   CREATININE 0.60 01/30/2023   GFRNONAA >60 01/30/2023   CALCIUM 8.9 01/30/2023   ALBUMIN 3.9 12/26/2020   GLUCOSE 170 (H) 01/30/2023   Hypothyroidism: Currently she is on levothyroxine 88 mcg daily. Lab Results   Component Value Date   TSH 2.23 01/07/2022   -While leaving the room she also mentions occasional SOB when she is active around the house for the last 6 months.No associated symptoms.  She saw cardiologist in 08/2022 and monitor patient, no further follow-up was necessary. Echo done on 08/28/2022 because of palpitations and lightheadedness showed LVEF 60 to 65% and normal diastolic function. Carotid US 08/2022: Right Carotid: There is no evidence of stenosis in the right ICA. The extracranial vessels were near-normal with only minimal  wall thickening or plaque.  Left Carotid: Velocities in the left ICA are consistent with a 1-39% stenosis. Minimal plaque noted.   HLD and PAD: She is on Rosuvastatin 5 mg 3 times per week, more frequent caused muscle aches. Lab Results  Component Value Date   CHOL 196 01/07/2022   HDL 58.50 01/07/2022   LDLCALC 110 (H) 01/07/2022   LDLDIRECT 111.0 12/26/2020   TRIG 140.0 01/07/2022   CHOLHDL 3 01/07/2022   Anxiety on Lorazepam 1 mg daily and Celexa 20 mg daily. Problem is stable.  Review of Systems  Constitutional:  Positive for fatigue.  Gastrointestinal:  Negative for abdominal pain.       No changes in bowel habits.  Endocrine: Negative for cold intolerance and heat intolerance.  Genitourinary:  Negative for decreased urine volume, dysuria and hematuria.  Musculoskeletal:  Positive for arthralgias and gait problem.  Skin:  Negative for rash.  Allergic/Immunologic: Positive for environmental allergies.  Neurological:  Negative for syncope and facial asymmetry.  Psychiatric/Behavioral:  Negative for confusion and hallucinations.  The patient is nervous/anxious.   See other pertinent positives and negatives in HPI.  Current Outpatient Medications on File Prior to Visit  Medication Sig Dispense Refill   benazepril (LOTENSIN) 20 MG tablet TAKE ONE-HALF TABLET BY MOUTH IN THE MORNING AND 1 TABLET BY  MOUTH IN THE EVENING 150 tablet 2    Cholecalciferol (VITAMIN D-3) 1000 UNITS CAPS Take 1,000 Units by mouth daily.     citalopram (CELEXA) 20 MG tablet TAKE 1 TABLET BY MOUTH DAILY 90 tablet 3   Iodine, Kelp, (KELP PO) Take 1 tablet by mouth daily.     levothyroxine (SYNTHROID) 88 MCG tablet TAKE 1 TABLET BY MOUTH DAILY  BEFORE BREAKFAST 100 tablet 2   LORazepam (ATIVAN) 1 MG tablet TAKE 1 TABLET BY MOUTH AT BEDTIME AS NEEDED FOR ANXIETY 30 tablet 3   magnesium hydroxide (MILK OF MAGNESIA) 400 MG/5ML suspension Take 5 mLs by mouth daily as needed for mild constipation.     Multiple Vitamin (MULTIVITAMIN WITH MINERALS) TABS tablet Take 1 tablet by mouth daily.     polyethylene glycol (MIRALAX / GLYCOLAX) 17 g packet Take 17 g by mouth daily.     rosuvastatin (CRESTOR) 5 MG tablet Take 5 mg by mouth 3 (three) times a week.     Selenium (SELENIMIN PO) Take 1 tablet by mouth daily.     No current facility-administered medications on file prior to visit.    Past Medical History:  Diagnosis Date   Anxiety    Arthritis    Bicuspid aortic valve    Depression    Heart murmur    Hypertension    Hypothyroidism    PAD (peripheral artery disease) (HCC)    Palpitations    Pneumonia    Polio    Thyroid disease    Allergies  Allergen Reactions   Clindamycin/Lincomycin Diarrhea and Other (See Comments)    C-Diff   Fluticasone     Headache and nosebleed    Azithromycin     Unknown   Egg-Derived Products     Per allergy testing   Milk-Related Compounds     Per allergy testing   Penicillins Rash   Sulfa Antibiotics Diarrhea   Fenofibrate Rash    Social History   Socioeconomic History   Marital status: Divorced    Spouse name: Not on file   Number of children: 2   Years of education: Not on file   Highest education level: Master's degree (e.g., MA, MS, MEng, MEd, MSW, MBA)  Occupational History    Employer: [employer]  Tobacco Use   Smoking status: Never   Smokeless tobacco: Never   Tobacco comments:     Rare smoker in the distant past.   Vaping Use   Vaping status: Never Used  Substance and Sexual Activity   Alcohol use: Yes    Alcohol/week: 1.0 standard drink of alcohol    Types: 1 Glasses of wine per week    Comment: wine 3 times per week   Drug use: No   Sexual activity: Not on file  Other Topics Concern   Not on file  Social History Narrative   Lives alone. 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    Social Determinants of Health   Financial Resource Strain: Low Risk  (04/23/2023)   Overall Financial Resource Strain (CARDIA)    Difficulty of Paying Living Expenses: Not hard at all  Food Insecurity: No Food Insecurity (04/23/2023)   Hunger Vital Sign    Worried About  Running Out of Food in the Last Year: Never true    Ran Out of Food in the Last Year: Never true  Transportation Needs: No Transportation Needs (04/23/2023)   PRAPARE - Administrator, Civil Service (Medical): No    Lack of Transportation (Non-Medical): No  Physical Activity: Sufficiently Active (04/23/2023)   Exercise Vital Sign    Days of Exercise per Week: 7 days    Minutes of Exercise per Session: 30 min  Stress: No Stress Concern Present (04/23/2023)   [employer] of Occupational Health - Occupational Stress Questionnaire    Feeling of Stress : Not at all  Social Connections: Moderately Integrated (04/23/2023)   Social Connection and Isolation Panel [NHANES]    Frequency of Communication with Friends and Family: More than three times a week    Frequency of Social Gatherings with Friends and Family: More than three times a week    Attends Religious Services: More than 4 times per year    Active Member of [employer] or Organizations: Yes    Attends Engineer, structural: More than 4 times per year    Marital Status: Divorced  Recent Concern: Social Connections - Moderately Isolated (02/24/2023)   Social Connection and Isolation Panel [NHANES]    Frequency of Communication with Friends and  Family: More than three times a week    Frequency of Social Gatherings with Friends and Family: Twice a week    Attends Religious Services: Never    Database administrator or Organizations: Yes    Attends Engineer, structural: More than 4 times per year    Marital Status: Divorced   Vitals:   06/04/23 0926  BP: 120/70  Pulse: 70  Resp: 16  Temp: 98.7 F (37.1 C)  SpO2: 98%   Body mass index is 23.53 kg/m.  Physical Exam Vitals and nursing note reviewed.  Constitutional:      General: She is not in acute distress.    Appearance: She is well-developed.  HENT:     Head: Normocephalic and atraumatic.     Mouth/Throat:     Mouth: Mucous membranes are moist.     Pharynx: Oropharynx is clear.  Eyes:     Conjunctiva/sclera: Conjunctivae normal.  Cardiovascular:     Rate and Rhythm: Normal rate and regular rhythm.     Pulses:          Dorsalis pedis pulses are 2+ on the right side and 2+ on the left side.     Heart sounds: Murmur (SEM I/VI RUSB) heard.  Pulmonary:     Effort: Pulmonary effort is normal. No respiratory distress.     Breath sounds: Normal breath sounds.  Abdominal:     Palpations: Abdomen is soft. There is no hepatomegaly or mass.     Tenderness: There is no abdominal tenderness.  Lymphadenopathy:     Cervical: No cervical adenopathy.  Skin:    General: Skin is warm.     Findings: No erythema or rash.  Neurological:     General: No focal deficit present.     Mental Status: She is alert and oriented to person, place, and time.     Cranial Nerves: No cranial nerve deficit.     Gait: Gait normal.     Comments: Mildly unstable gait, not assisted. RLE weakness, polio sequela. Rest with no significant deficit.  Psychiatric:        Mood and Affect: Affect normal. Mood is anxious.  ASSESSMENT AND PLAN:  Ms. Pettitt was seen today for recent fever and headaches and other concerns.  Orders Placed This Encounter  Procedures   DG Chest 2 View    Comprehensive metabolic panel   Lipid panel   C-reactive protein   CBC   T4, free   TSH   POC HgB A1c   SOB (shortness of breath) Mention 6 months of SOB with some chores around her house, she attributes problem to age. No associated symptoms. Possible etiologies discussed, ? Deconditioning. Instructed about warning signs. If worsening problem, she was instructed to arrange appt with cardiologist.  -     DG Chest 2 View; Future  Primary hypertension Assessment & Plan: BP adequately controlled. Continue benazepril 20 mg 1.5 tablets daily and low salt diet. Continue monitoring BP regularly. Follow-up in 4 months, before if needed.  Orders: -     Comprehensive metabolic panel; Future  Anxiety disorder, unspecified type Assessment & Plan: Stable overall. Continue Celexa 20 mg daily and lorazepam 1 mg daily at bedtime. F/U in 4 months.  Lightheadedness It seems to be a recurrent problem. Fall precautions discussed. Further recommendations according to lab results.  -     CBC; Future  Headache, unspecified headache type ? Tension headache. I do not think imaging is needed at this time. Monitor for new symptoms.  -     C-reactive protein; Future  Hyperlipidemia, unspecified hyperlipidemia type Assessment & Plan: Last LDL 110 in 01/2022. Currently on rosuvastatin 5 mg 3 times per week and low-fat diet. Further recommendation will be given according to fasting lipid panel, which will be done tomorrow.  Orders: -     Lipid panel; Future  Other chest pain Not associated with exertion and no associated symptoms. Hx does not suggest a serious process, ? Musculoskeletal pain. She is not having pain at this time. Instructed about warning signs. CXR ordered today.  -     DG Chest 2 View; Future  Hypothyroidism (acquired) Assessment & Plan: Problem has been adequately controlled. Last TSH in 01/2022 was 2.2. Continue levothyroxine 88 mcg daily. TSH to be done  tomorrow.  Orders: -     T4, free; Future -     TSH; Future  Prediabetes Assessment & Plan: Glucose elevated in 01/2023, 170. Hemoglobin A1c today 5.9. Encourage consistency with a healthy lifestyle for diabetes prevention.  Orders: -     POCT glycosylated hemoglobin (Hb A1C)  I spent a total of 44 minutes in both face to face and non face to face activities for this visit on the date of this encounter. During this time history was obtained and documented, examination was performed, prior labs/imaging reviewed, and assessment/plan discussed.  Return in about 4 months (around 10/04/2023).  I, Rolla Etienne Wierda, acting as a scribe for Medardo Hassing [place], MD., have documented all relevant documentation on the behalf of Chrisangel Eskenazi [place], MD, as directed by  Brandt Chaney [place], MD while in the presence of Kyrus Hyde [place], MD.   I, Canuto Kingston [place], MD, have reviewed all documentation for this visit. The documentation on 06/04/23 for the exam, diagnosis, procedures, and orders are all accurate and complete.  Kathelene Rumberger G. [place], MD  [facility]. [place] office.

## 2023-06-04 NOTE — Assessment & Plan Note (Signed)
Last LDL 110 in 01/2022. Currently on rosuvastatin 5 mg 3 times per week and low-fat diet. Further recommendation will be given according to fasting lipid panel, which will be done tomorrow.

## 2023-06-05 ENCOUNTER — Other Ambulatory Visit: Payer: Self-pay | Admitting: Family Medicine

## 2023-06-05 ENCOUNTER — Other Ambulatory Visit (INDEPENDENT_AMBULATORY_CARE_PROVIDER_SITE_OTHER): Payer: Medicare Other

## 2023-06-05 DIAGNOSIS — E039 Hypothyroidism, unspecified: Secondary | ICD-10-CM

## 2023-06-05 DIAGNOSIS — F419 Anxiety disorder, unspecified: Secondary | ICD-10-CM

## 2023-06-05 DIAGNOSIS — R519 Headache, unspecified: Secondary | ICD-10-CM | POA: Diagnosis not present

## 2023-06-05 DIAGNOSIS — I10 Essential (primary) hypertension: Secondary | ICD-10-CM | POA: Diagnosis not present

## 2023-06-05 DIAGNOSIS — E785 Hyperlipidemia, unspecified: Secondary | ICD-10-CM | POA: Diagnosis not present

## 2023-06-05 DIAGNOSIS — R42 Dizziness and giddiness: Secondary | ICD-10-CM | POA: Diagnosis not present

## 2023-06-05 LAB — T4, FREE: Free T4: 0.98 ng/dL (ref 0.60–1.60)

## 2023-06-05 LAB — C-REACTIVE PROTEIN: CRP: <1 mg/dL (ref 0.5–20.0)

## 2023-06-05 LAB — COMPREHENSIVE METABOLIC PANEL
ALT: 9 U/L (ref 0–35)
AST: 16 U/L (ref 0–37)
Albumin: 4.2 g/dL (ref 3.5–5.2)
Alkaline Phosphatase: 88 U/L (ref 39–117)
BUN: 9 mg/dL (ref 6–23)
CO2: 29 mEq/L (ref 19–32)
Calcium: 9.3 mg/dL (ref 8.4–10.5)
Chloride: 103 mEq/L (ref 96–112)
Creatinine, Ser: 0.56 mg/dL (ref 0.40–1.20)
GFR: 84.97 mL/min (ref >60.00)
Glucose, Bld: 107 mg/dL — ABNORMAL HIGH (ref 70–99)
Potassium: 4.9 mEq/L (ref 3.5–5.1)
Sodium: 138 mEq/L (ref 135–145)
Total Bilirubin: 0.8 mg/dL (ref 0.2–1.2)
Total Protein: 6.3 g/dL (ref 6.0–8.3)

## 2023-06-05 LAB — LIPID PANEL
Cholesterol: 191 mg/dL (ref 0–200)
HDL: 51.1 mg/dL (ref >39.00)
LDL Cholesterol: 113 mg/dL — ABNORMAL HIGH (ref 0–99)
NonHDL: 140.16
Total CHOL/HDL Ratio: 4
Triglycerides: 138 mg/dL (ref 0.0–149.0)
VLDL: 27.6 mg/dL (ref 0.0–40.0)

## 2023-06-05 LAB — CBC
HCT: 40.7 % (ref 36.0–46.0)
Hemoglobin: 13.2 g/dL (ref 12.0–15.0)
MCHC: 32.4 g/dL (ref 30.0–36.0)
MCV: 89.2 fl (ref 78.0–100.0)
Platelets: 277 10*3/uL (ref 150.0–400.0)
RBC: 4.56 Mil/uL (ref 3.87–5.11)
RDW: 14 % (ref 11.5–15.5)
WBC: 6 10*3/uL (ref 4.0–10.5)

## 2023-06-05 LAB — TSH: TSH: 0.95 u[IU]/mL (ref 0.35–5.50)

## 2023-06-05 NOTE — Progress Notes (Signed)
Pt was here for labs only, tolerated draw well.

## 2023-06-09 ENCOUNTER — Ambulatory Visit (INDEPENDENT_AMBULATORY_CARE_PROVIDER_SITE_OTHER): Payer: Medicare Other

## 2023-06-09 DIAGNOSIS — Z23 Encounter for immunization: Secondary | ICD-10-CM

## 2023-06-10 DIAGNOSIS — S233XXD Sprain of ligaments of thoracic spine, subsequent encounter: Secondary | ICD-10-CM | POA: Diagnosis not present

## 2023-06-10 DIAGNOSIS — S76312D Strain of muscle, fascia and tendon of the posterior muscle group at thigh level, left thigh, subsequent encounter: Secondary | ICD-10-CM | POA: Diagnosis not present

## 2023-06-10 DIAGNOSIS — S335XXD Sprain of ligaments of lumbar spine, subsequent encounter: Secondary | ICD-10-CM | POA: Diagnosis not present

## 2023-06-27 DIAGNOSIS — S76312D Strain of muscle, fascia and tendon of the posterior muscle group at thigh level, left thigh, subsequent encounter: Secondary | ICD-10-CM | POA: Diagnosis not present

## 2023-06-27 DIAGNOSIS — S233XXD Sprain of ligaments of thoracic spine, subsequent encounter: Secondary | ICD-10-CM | POA: Diagnosis not present

## 2023-06-27 DIAGNOSIS — S335XXD Sprain of ligaments of lumbar spine, subsequent encounter: Secondary | ICD-10-CM | POA: Diagnosis not present

## 2023-06-30 DIAGNOSIS — S233XXD Sprain of ligaments of thoracic spine, subsequent encounter: Secondary | ICD-10-CM | POA: Diagnosis not present

## 2023-06-30 DIAGNOSIS — S76312D Strain of muscle, fascia and tendon of the posterior muscle group at thigh level, left thigh, subsequent encounter: Secondary | ICD-10-CM | POA: Diagnosis not present

## 2023-06-30 DIAGNOSIS — S335XXD Sprain of ligaments of lumbar spine, subsequent encounter: Secondary | ICD-10-CM | POA: Diagnosis not present

## 2023-07-03 DIAGNOSIS — S335XXD Sprain of ligaments of lumbar spine, subsequent encounter: Secondary | ICD-10-CM | POA: Diagnosis not present

## 2023-07-03 DIAGNOSIS — S76312D Strain of muscle, fascia and tendon of the posterior muscle group at thigh level, left thigh, subsequent encounter: Secondary | ICD-10-CM | POA: Diagnosis not present

## 2023-07-03 DIAGNOSIS — S233XXD Sprain of ligaments of thoracic spine, subsequent encounter: Secondary | ICD-10-CM | POA: Diagnosis not present

## 2023-07-11 DIAGNOSIS — S76312D Strain of muscle, fascia and tendon of the posterior muscle group at thigh level, left thigh, subsequent encounter: Secondary | ICD-10-CM | POA: Diagnosis not present

## 2023-07-11 DIAGNOSIS — S335XXD Sprain of ligaments of lumbar spine, subsequent encounter: Secondary | ICD-10-CM | POA: Diagnosis not present

## 2023-07-11 DIAGNOSIS — S233XXD Sprain of ligaments of thoracic spine, subsequent encounter: Secondary | ICD-10-CM | POA: Diagnosis not present

## 2023-07-14 ENCOUNTER — Telehealth: Payer: Self-pay | Admitting: Family Medicine

## 2023-07-14 NOTE — Telephone Encounter (Signed)
Pt is calling and would like to know if tetanus and tdap the same thing

## 2023-07-14 NOTE — Telephone Encounter (Signed)
Patient called back in, she will follow up with the drug store to see which vaccine they gave her. Nothing further needed at this time.

## 2023-07-14 NOTE — Telephone Encounter (Signed)
I left patient a voicemail letting her know that the difference between the vaccines is that the tetanus is only for tetanus, whereas the Tdap does tetanus, diptheria, and whooping cough. Advised her to call back with any questions.

## 2023-07-16 DIAGNOSIS — M7062 Trochanteric bursitis, left hip: Secondary | ICD-10-CM | POA: Diagnosis not present

## 2023-07-23 DIAGNOSIS — S335XXD Sprain of ligaments of lumbar spine, subsequent encounter: Secondary | ICD-10-CM | POA: Diagnosis not present

## 2023-07-23 DIAGNOSIS — S233XXD Sprain of ligaments of thoracic spine, subsequent encounter: Secondary | ICD-10-CM | POA: Diagnosis not present

## 2023-07-23 DIAGNOSIS — S76312D Strain of muscle, fascia and tendon of the posterior muscle group at thigh level, left thigh, subsequent encounter: Secondary | ICD-10-CM | POA: Diagnosis not present

## 2023-07-24 DIAGNOSIS — D225 Melanocytic nevi of trunk: Secondary | ICD-10-CM | POA: Diagnosis not present

## 2023-07-24 DIAGNOSIS — H16223 Keratoconjunctivitis sicca, not specified as Sjogren's, bilateral: Secondary | ICD-10-CM | POA: Diagnosis not present

## 2023-07-24 DIAGNOSIS — L814 Other melanin hyperpigmentation: Secondary | ICD-10-CM | POA: Diagnosis not present

## 2023-07-24 DIAGNOSIS — Z961 Presence of intraocular lens: Secondary | ICD-10-CM | POA: Diagnosis not present

## 2023-07-24 DIAGNOSIS — H43813 Vitreous degeneration, bilateral: Secondary | ICD-10-CM | POA: Diagnosis not present

## 2023-07-24 DIAGNOSIS — L821 Other seborrheic keratosis: Secondary | ICD-10-CM | POA: Diagnosis not present

## 2023-07-25 DIAGNOSIS — S335XXD Sprain of ligaments of lumbar spine, subsequent encounter: Secondary | ICD-10-CM | POA: Diagnosis not present

## 2023-07-25 DIAGNOSIS — S233XXD Sprain of ligaments of thoracic spine, subsequent encounter: Secondary | ICD-10-CM | POA: Diagnosis not present

## 2023-07-25 DIAGNOSIS — S76312D Strain of muscle, fascia and tendon of the posterior muscle group at thigh level, left thigh, subsequent encounter: Secondary | ICD-10-CM | POA: Diagnosis not present

## 2023-07-29 ENCOUNTER — Emergency Department (HOSPITAL_BASED_OUTPATIENT_CLINIC_OR_DEPARTMENT_OTHER): Payer: Medicare Other

## 2023-07-29 ENCOUNTER — Encounter (HOSPITAL_BASED_OUTPATIENT_CLINIC_OR_DEPARTMENT_OTHER): Payer: Self-pay

## 2023-07-29 ENCOUNTER — Emergency Department (HOSPITAL_BASED_OUTPATIENT_CLINIC_OR_DEPARTMENT_OTHER)
Admission: EM | Admit: 2023-07-29 | Discharge: 2023-07-29 | Disposition: A | Discharge status: Home / Self Care | Payer: Medicare Other | Attending: Emergency Medicine | Admitting: Emergency Medicine

## 2023-07-29 ENCOUNTER — Other Ambulatory Visit: Payer: Self-pay

## 2023-07-29 ENCOUNTER — Emergency Department (HOSPITAL_BASED_OUTPATIENT_CLINIC_OR_DEPARTMENT_OTHER): Payer: Medicare Other | Admitting: Radiology

## 2023-07-29 DIAGNOSIS — S0990XA Unspecified injury of head, initial encounter: Secondary | ICD-10-CM | POA: Diagnosis not present

## 2023-07-29 DIAGNOSIS — S233XXD Sprain of ligaments of thoracic spine, subsequent encounter: Secondary | ICD-10-CM | POA: Diagnosis not present

## 2023-07-29 DIAGNOSIS — M19042 Primary osteoarthritis, left hand: Secondary | ICD-10-CM | POA: Diagnosis not present

## 2023-07-29 DIAGNOSIS — S7011XA Contusion of right thigh, initial encounter: Secondary | ICD-10-CM | POA: Diagnosis not present

## 2023-07-29 DIAGNOSIS — S335XXD Sprain of ligaments of lumbar spine, subsequent encounter: Secondary | ICD-10-CM | POA: Diagnosis not present

## 2023-07-29 DIAGNOSIS — W01198A Fall on same level from slipping, tripping and stumbling with subsequent striking against other object, initial encounter: Secondary | ICD-10-CM | POA: Insufficient documentation

## 2023-07-29 DIAGNOSIS — S0003XA Contusion of scalp, initial encounter: Secondary | ICD-10-CM | POA: Diagnosis not present

## 2023-07-29 DIAGNOSIS — M79642 Pain in left hand: Secondary | ICD-10-CM | POA: Diagnosis not present

## 2023-07-29 DIAGNOSIS — Z043 Encounter for examination and observation following other accident: Secondary | ICD-10-CM | POA: Diagnosis not present

## 2023-07-29 DIAGNOSIS — S76312D Strain of muscle, fascia and tendon of the posterior muscle group at thigh level, left thigh, subsequent encounter: Secondary | ICD-10-CM | POA: Diagnosis not present

## 2023-07-29 DIAGNOSIS — S61411A Laceration without foreign body of right hand, initial encounter: Secondary | ICD-10-CM | POA: Insufficient documentation

## 2023-07-29 DIAGNOSIS — S199XXA Unspecified injury of neck, initial encounter: Secondary | ICD-10-CM | POA: Diagnosis not present

## 2023-07-29 NOTE — ED Triage Notes (Signed)
In for eval of mechanical fall from standing. She lost her balance while closing her SUV trunk. Struck the back of her head on concrete. Negative LOC. Negative blood thinners. Pain to posterior head, right hand abrasion, left hand pain, and bruising to right thigh.

## 2023-07-29 NOTE — ED Provider Notes (Signed)
[place] EMERGENCY DEPARTMENT AT [facility] Provider Note   CSN: [id number] Arrival date & time: 07/29/23  1206     History  Chief Complaint  Patient presents with   Crystal Levy is a 82 y.o. female.  Patient presents to the ED after a mechanical fall. She fell backwards, hitting the back of her head on concrete. She did not lose consciousness. She is not on blood thinners. Complaining of pain to the back of her head with a small hematoma. Skin tear to right hand. Left thumb pain. Bruising to right thigh.  The history is provided by the patient.  Fall This is a new problem. The problem has been gradually improving.       Home Medications Prior to Admission medications   Medication Sig Start Date End Date Taking? Authorizing Provider  benazepril (LOTENSIN) 20 MG tablet TAKE ONE-HALF TABLET BY MOUTH IN THE MORNING AND 1 TABLET BY  MOUTH IN THE EVENING 03/11/23   [place], Betty G, MD  Cholecalciferol (VITAMIN D-3) 1000 UNITS CAPS Take 1,000 Units by mouth daily.    [provider]  citalopram (CELEXA) 20 MG tablet TAKE 1 TABLET BY MOUTH DAILY 06/06/23   [place], Betty G, MD  Iodine, Kelp, (KELP PO) Take 1 tablet by mouth daily.    [provider]  levothyroxine (SYNTHROID) 88 MCG tablet TAKE 1 TABLET BY MOUTH DAILY  BEFORE BREAKFAST 03/11/23   [place], Betty G, MD  LORazepam (ATIVAN) 1 MG tablet TAKE 1 TABLET BY MOUTH AT BEDTIME AS NEEDED FOR ANXIETY 04/14/23   [place], Betty G, MD  magnesium hydroxide (MILK OF MAGNESIA) 400 MG/5ML suspension Take 5 mLs by mouth daily as needed for mild constipation.    [provider]  Multiple Vitamin (MULTIVITAMIN WITH MINERALS) TABS tablet Take 1 tablet by mouth daily.    [provider]  polyethylene glycol (MIRALAX / GLYCOLAX) 17 g packet Take 17 g by mouth daily.    [provider]  rosuvastatin (CRESTOR) 5 MG tablet Take 5 mg by mouth 3 (three) times a week.    [provider]  Selenium (SELENIMIN PO) Take 1 tablet by mouth daily.    [provider]      Allergies    Clindamycin/lincomycin, Fluticasone, Azithromycin, Egg-derived products, Milk-related compounds, Penicillins, Sulfa antibiotics, and Fenofibrate    Review of Systems   Review of Systems  Musculoskeletal:  Negative for back pain and neck stiffness.  Skin:  Positive for wound.  Neurological:  Negative for dizziness, speech difficulty, weakness and light-headedness.  All other systems reviewed and are negative.   Physical Exam Updated Vital Signs BP (!) 179/65   Pulse 69   Temp 98.9 F (37.2 C)   Resp 16   Ht 5\' 5"  (1.651 m)   Wt 62.6 kg   SpO2 100%   BMI 22.96 kg/m  Physical Exam Constitutional:      Appearance: Normal appearance.  HENT:     Nose: Nose normal.     Mouth/Throat:     Mouth: Mucous membranes are moist.  Eyes:     Extraocular Movements: Extraocular movements intact.     Conjunctiva/sclera: Conjunctivae normal.  Cardiovascular:     Rate and Rhythm: Normal rate and regular rhythm.  Pulmonary:     Effort: Pulmonary effort is normal.     Breath sounds: Normal breath sounds.  Abdominal:     Palpations: Abdomen is soft.  Musculoskeletal:  General: Tenderness present. Normal range of motion.     Cervical back: Normal range of motion and neck supple.  Skin:    General: Skin is warm and dry.  Neurological:     Mental Status: She is alert and oriented to person, place, and time.  Psychiatric:        Mood and Affect: Mood normal.        Behavior: Behavior normal.     ED Results / Procedures / Treatments   Labs (all labs ordered are listed, but only abnormal results are displayed) Labs Reviewed - No data to display  EKG None  Radiology CT Head Wo Contrast  Result Date: 07/29/2023 CLINICAL DATA:  Head trauma, minor (Age >= 65y); Neck trauma (Age >= 65y) EXAM: CT HEAD WITHOUT CONTRAST CT CERVICAL SPINE WITHOUT CONTRAST TECHNIQUE:  Multidetector CT imaging of the head and cervical spine was performed following the standard protocol without intravenous contrast. Multiplanar CT image reconstructions of the cervical spine were also generated. RADIATION DOSE REDUCTION: This exam was performed according to the departmental dose-optimization program which includes automated exposure control, adjustment of the mA and/or kV according to patient size and/or use of iterative reconstruction technique. COMPARISON:  CT cervical spine 04/22/2022.  CT head 06/22/2021. FINDINGS: CT HEAD FINDINGS Brain: No evidence of acute infarction, hemorrhage, hydrocephalus, extra-axial collection or mass lesion/mass effect. Vascular: Calcific atherosclerosis. Skull: Scalp vertex contusion.  No acute fracture. Sinuses/Orbits: Clear sinuses.  No acute orbital findings. CT CERVICAL SPINE FINDINGS Alignment: Chronic degenerative anterolisthesis of C7 on T1. Skull base and vertebrae: No evidence of acute fracture. Vertebral body heights are maintained. Soft tissues and spinal canal: No prevertebral fluid or swelling. No visible canal hematoma. Disc levels: Multilevel degenerative change including facet uncovertebral hypertrophy with varying degrees of neural foraminal stenosis. Upper chest: Visualized lung apices are clear. IMPRESSION: 1. No evidence of acute abnormality intracranially or in the cervical spine. 2. Scalp vertex contusion Electronically Signed   By: Feliberto Harts M.D.   On: 07/29/2023 15:15   CT Cervical Spine Wo Contrast  Result Date: 07/29/2023 CLINICAL DATA:  Head trauma, minor (Age >= 65y); Neck trauma (Age >= 65y) EXAM: CT HEAD WITHOUT CONTRAST CT CERVICAL SPINE WITHOUT CONTRAST TECHNIQUE: Multidetector CT imaging of the head and cervical spine was performed following the standard protocol without intravenous contrast. Multiplanar CT image reconstructions of the cervical spine were also generated. RADIATION DOSE REDUCTION: This exam was performed  according to the departmental dose-optimization program which includes automated exposure control, adjustment of the mA and/or kV according to patient size and/or use of iterative reconstruction technique. COMPARISON:  CT cervical spine 04/22/2022.  CT head 06/22/2021. FINDINGS: CT HEAD FINDINGS Brain: No evidence of acute infarction, hemorrhage, hydrocephalus, extra-axial collection or mass lesion/mass effect. Vascular: Calcific atherosclerosis. Skull: Scalp vertex contusion.  No acute fracture. Sinuses/Orbits: Clear sinuses.  No acute orbital findings. CT CERVICAL SPINE FINDINGS Alignment: Chronic degenerative anterolisthesis of C7 on T1. Skull base and vertebrae: No evidence of acute fracture. Vertebral body heights are maintained. Soft tissues and spinal canal: No prevertebral fluid or swelling. No visible canal hematoma. Disc levels: Multilevel degenerative change including facet uncovertebral hypertrophy with varying degrees of neural foraminal stenosis. Upper chest: Visualized lung apices are clear. IMPRESSION: 1. No evidence of acute abnormality intracranially or in the cervical spine. 2. Scalp vertex contusion Electronically Signed   By: Feliberto Harts M.D.   On: 07/29/2023 15:15    Procedures Procedures    Medications Ordered in ED  Medications - No data to display  ED Course/ Medical Decision Making/ A&P                                 Medical Decision Making  Patient discussed with and seen by Dr. Particia Nearing.  Patient symptoms consistent with minor head trauma secondary to a fall. No vomiting. No focal neurological deficits on physical exam.  Pt observed in the ED.  CT of head and neck are reassuring. Discussed reasons to return to the emergency department including any new  severe headaches, disequilibrium, vomiting, double vision, extremity weakness, difficulty ambulating, or any other concerning symptoms. Patient will be discharged with information pertaining to diagnosis. Pt is safe  for discharge at this time.         Final Clinical Impression(s) / ED Diagnoses Final diagnoses:  Minor traumatic injury of head with normal mental status  Skin tear of right hand without complication, initial encounter  Left hand pain    Rx / DC Orders ED Discharge Orders     None         Felicie Morn, NP 07/29/23 1625    Jacalyn Lefevre, MD 07/29/23 1651

## 2023-07-29 NOTE — Discharge Instructions (Addendum)
Please refer to the attached instructions. Follow-up with your PCP as needed.

## 2023-07-29 NOTE — ED Notes (Signed)
Skin tear to right hand/ thumb cleansed.  Drsg. With bacterin; Telfa and Kerlix wrap

## 2023-07-30 ENCOUNTER — Telehealth: Payer: Self-pay

## 2023-07-30 NOTE — Transitions of Care (Post Inpatient/ED Visit) (Signed)
07/30/2023  Name: Crystal Levy MRN: [id number] DOB: 1941/06/02  Today's TOC FU Call Status: Today's TOC FU Call Status:: Successful TOC FU Call Completed TOC FU Call Complete Date: 07/30/23 Patient's Name and Date of Birth confirmed.  Transition Care Management Follow-up Telephone Call Date of Discharge: 07/29/23 Discharge Facility: [facility] ([facility]) Type of Discharge: Emergency Department Reason for ED Visit: Other: (fall) How have you been since you were released from the hospital?: Better Any questions or concerns?: No  Items Reviewed: Did you receive and understand the discharge instructions provided?: Yes Medications obtained,verified, and reconciled?: Yes (Medications Reviewed) (patient states she increased her citalopram by 1/2 tab) Any new allergies since your discharge?: No Dietary orders reviewed?: Yes Do you have support at home?: No  Medications Reviewed Today: Medications Reviewed Today     Reviewed by Karena Addison, LPN (Licensed Practical Nurse) on 07/30/23 at [phone]  Med List Status: <None>   Medication Order Taking? Sig Documenting Provider Last Dose Status Informant  benazepril (LOTENSIN) 20 MG tablet [id number] Yes TAKE ONE-HALF TABLET BY MOUTH IN THE MORNING AND 1 TABLET BY  MOUTH IN THE Jobe Gibbon [place], Betty G, MD Taking Active   Cholecalciferol (VITAMIN D-3) 1000 UNITS CAPS [id number] Yes Take 1,000 Units by mouth daily. [provider] Taking Active Self, Pharmacy Records  citalopram (CELEXA) 20 MG tablet [id number] Yes TAKE 1 TABLET BY MOUTH DAILY  Patient taking differently: Take 20 mg by mouth daily. Patient states she increased dosage from 20mg  to 30mg  daily   [place], Timoteo Expose, MD Taking Active   Iodine, Kelp, (KELP PO) [id number] Yes Take 1 tablet by mouth daily. [provider] Taking Active Self, Pharmacy Records  levothyroxine (SYNTHROID) 88 MCG tablet [id number] Yes TAKE 1 TABLET BY MOUTH DAILY  BEFORE BREAKFAST [place],  Betty G, MD Taking Active   LORazepam (ATIVAN) 1 MG tablet [id number] Yes TAKE 1 TABLET BY MOUTH AT BEDTIME AS NEEDED FOR ANXIETY [place], Betty G, MD Taking Active   magnesium hydroxide (MILK OF MAGNESIA) 400 MG/5ML suspension [id number] Yes Take 5 mLs by mouth daily as needed for mild constipation. [provider] Taking Active Self  Multiple Vitamin (MULTIVITAMIN WITH MINERALS) TABS tablet [id number] Yes Take 1 tablet by mouth daily. [provider] Taking Active Self, Pharmacy Records  polyethylene glycol (MIRALAX / GLYCOLAX) 17 g packet [id number] Yes Take 17 g by mouth daily. [provider] Taking Active   rosuvastatin (CRESTOR) 5 MG tablet [id number] Yes Take 5 mg by mouth 3 (three) times a week. [provider] Taking Active   Selenium (SELENIMIN PO) [id number] Yes Take 1 tablet by mouth daily. [provider] Taking Active Self, Pharmacy Records            Home Care and Equipment/Supplies: Were Home Health Services Ordered?: NA Any new equipment or medical supplies ordered?: NA  Functional Questionnaire: Do you need assistance with bathing/showering or dressing?: No Do you need assistance with meal preparation?: No Do you need assistance with eating?: No Do you have difficulty maintaining continence: No Do you need assistance with getting out of bed/getting out of a chair/moving?: No Do you have difficulty managing or taking your medications?: No  Follow up appointments reviewed: PCP Follow-up appointment confirmed?: No (declined) MD Provider Line [phone] Given: No Specialist Hospital Follow-up appointment confirmed?: NA Do you need transportation to your follow-up appointment?: No Do you understand care options if your condition(s) worsen?: Yes-patient verbalized understanding    SIGNATURE Karena Addison, LPN [facility] Nurse Health  Chief Financial Officer [phone]

## 2023-07-31 DIAGNOSIS — S335XXD Sprain of ligaments of lumbar spine, subsequent encounter: Secondary | ICD-10-CM | POA: Diagnosis not present

## 2023-07-31 DIAGNOSIS — S233XXD Sprain of ligaments of thoracic spine, subsequent encounter: Secondary | ICD-10-CM | POA: Diagnosis not present

## 2023-07-31 DIAGNOSIS — S76312D Strain of muscle, fascia and tendon of the posterior muscle group at thigh level, left thigh, subsequent encounter: Secondary | ICD-10-CM | POA: Diagnosis not present

## 2023-08-06 ENCOUNTER — Encounter: Payer: Self-pay | Admitting: Family Medicine

## 2023-08-06 ENCOUNTER — Ambulatory Visit (INDEPENDENT_AMBULATORY_CARE_PROVIDER_SITE_OTHER): Payer: Medicare Other | Admitting: Family Medicine

## 2023-08-06 VITALS — BP 136/70 | HR 76 | Resp 16 | Ht 65.0 in | Wt 146.4 lb

## 2023-08-06 DIAGNOSIS — E039 Hypothyroidism, unspecified: Secondary | ICD-10-CM | POA: Diagnosis not present

## 2023-08-06 DIAGNOSIS — G44319 Acute post-traumatic headache, not intractable: Secondary | ICD-10-CM

## 2023-08-06 DIAGNOSIS — W19XXXD Unspecified fall, subsequent encounter: Secondary | ICD-10-CM

## 2023-08-06 DIAGNOSIS — I10 Essential (primary) hypertension: Secondary | ICD-10-CM | POA: Diagnosis not present

## 2023-08-06 NOTE — Progress Notes (Signed)
ACUTE VISIT Chief Complaint  Patient presents with   Follow-up   HPI: [name] is a 82 y.o. female with a PMHx significant for HTN, anxiety, PAD, OA, and hx of polio, who is here today for ER follow up for fall.   Patient was seen in the ED on 11/19 after a fall. She was loading something in her car,lost balance when trying to reach up for truck's door, and fell backwards. Hit her head, no LOC. She has had falls in the past, and her last one was 2 years ago.   In the ER, she had a left hand x-ray and CT's of her head and cervical spine, which were negative for acute process.  Head and cervical CT : 1. No evidence of acute abnormality intracranially or in the cervical spine. 2. Scalp vertex contusion  Left Hand X-ray Impression:  1. No acute fracture or dislocation. 2. Degenerative changes of the first and second metacarpophalangeal joints  Headache:  Since ED discharge, she has had a constant right temporal headache, which she describes as a pressure, goes around her eye and rates as a 4-5/10. She says the headache moves to her right occipital area and feels cold there. Reports that a laceration with dry blood was noted while she was having her hair done, she mentions that area was not cleaned during ED visit.  Negative fro visual changes, facial numbness/tingling, photophobia, nausea,vomiting,or new focal weakness  She mentions she had an episode while she was working at [employer], did forget somebody got the pile of things she has completed, she did not realized until someone brought it to her attention. She is not aware of another forgetfulness episode.  She also complains of lightheadedness and a movement sensation. She says this is not happening while she is in bed. She has also been feeling feverish and having some chills.  Negative for MS changes, sick contact, abdominal pain, changes in bowel habits,or urinary symptoms.  Hypertension: Currently on benazepril 10 mg in  the morning and 20 mg in the evening.  BP readings at home: Her BP was high in the ER, 179/65,but she says it has been normal since she returned home.   Lab Results  Component Value Date   CREATININE 0.56 06/05/2023   BUN 9 06/05/2023   NA 138 06/05/2023   K 4.9 06/05/2023   CL 103 06/05/2023   CO2 29 06/05/2023   She mentions she is in physical therapy ordered by her orthopedist. Her last appointment will be the day after Thanksgiving. She also mentions the injections she was given did not help with her hip pain.   She is also inquiring about possible side effects of Levothyroxine on bone density and asking if there is a different treatment that may be safer. Lab Results  Component Value Date   TSH 0.95 06/05/2023   She is on Levothyroxine 88 mcg daily.  Review of Systems  Constitutional:  Positive for activity change and fatigue. Negative for appetite change and chills.  HENT:  Negative for nosebleeds, sore throat and trouble swallowing.   Cardiovascular:  Negative for palpitations and leg swelling.  Musculoskeletal:  Positive for arthralgias and gait problem.  Neurological:  Negative for syncope and facial asymmetry.  Psychiatric/Behavioral:  Negative for hallucinations. The patient is nervous/anxious.   See other pertinent positives and negatives in HPI.  Current Outpatient Medications on File Prior to Visit  Medication Sig Dispense Refill   benazepril (LOTENSIN) 20 MG tablet TAKE ONE-HALF  TABLET BY MOUTH IN THE MORNING AND 1 TABLET BY  MOUTH IN THE EVENING 150 tablet 2   Cholecalciferol (VITAMIN D-3) 1000 UNITS CAPS Take 1,000 Units by mouth daily.     citalopram (CELEXA) 20 MG tablet TAKE 1 TABLET BY MOUTH DAILY (Patient taking differently: Take 20 mg by mouth daily. Patient states she increased dosage from 20mg  to 30mg  daily) 90 tablet 3   Iodine, Kelp, (KELP PO) Take 1 tablet by mouth daily.     levothyroxine (SYNTHROID) 88 MCG tablet TAKE 1 TABLET BY MOUTH DAILY  BEFORE  BREAKFAST 100 tablet 2   LORazepam (ATIVAN) 1 MG tablet TAKE 1 TABLET BY MOUTH AT BEDTIME AS NEEDED FOR ANXIETY 30 tablet 3   magnesium hydroxide (MILK OF MAGNESIA) 400 MG/5ML suspension Take 5 mLs by mouth daily as needed for mild constipation.     Multiple Vitamin (MULTIVITAMIN WITH MINERALS) TABS tablet Take 1 tablet by mouth daily.     polyethylene glycol (MIRALAX / GLYCOLAX) 17 g packet Take 17 g by mouth daily.     rosuvastatin (CRESTOR) 5 MG tablet Take 5 mg by mouth 3 (three) times a week.     Selenium (SELENIMIN PO) Take 1 tablet by mouth daily.     No current facility-administered medications on file prior to visit.    Past Medical History:  Diagnosis Date   Anxiety    Arthritis    Bicuspid aortic valve    Depression    Heart murmur    Hypertension    Hypothyroidism    PAD (peripheral artery disease) (HCC)    Palpitations    Pneumonia    Polio    Thyroid disease    Allergies  Allergen Reactions   Clindamycin/Lincomycin Diarrhea and Other (See Comments)    C-Diff   Fluticasone     Headache and nosebleed    Azithromycin     Unknown   Egg-Derived Products     Per allergy testing   Milk-Related Compounds     Per allergy testing   Penicillins Rash   Sulfa Antibiotics Diarrhea   Fenofibrate Rash    Social History   Socioeconomic History   Marital status: Divorced    Spouse name: Not on file   Number of children: 2   Years of education: Not on file   Highest education level: Master's degree (e.g., MA, MS, MEng, MEd, MSW, MBA)  Occupational History    Employer: [employer]  Tobacco Use   Smoking status: Never   Smokeless tobacco: Never   Tobacco comments:    Rare smoker in the distant past.   Vaping Use   Vaping status: Never Used  Substance and Sexual Activity   Alcohol use: Yes    Alcohol/week: 1.0 standard drink of alcohol    Types: 1 Glasses of wine per week    Comment: wine 3 times per week   Drug use: No   Sexual activity: Not on  file  Other Topics Concern   Not on file  Social History Narrative   Lives alone. 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    Social Determinants of Health   Financial Resource Strain: Low Risk  (04/23/2023)   Overall Financial Resource Strain (CARDIA)    Difficulty of Paying Living Expenses: Not hard at all  Food Insecurity: No Food Insecurity (04/23/2023)   Hunger Vital Sign    Worried About Running Out of Food in the Last Year: Never true    Ran Out of Food in  the Last Year: Never true  Transportation Needs: No Transportation Needs (04/23/2023)   PRAPARE - Administrator, Civil Service (Medical): No    Lack of Transportation (Non-Medical): No  Physical Activity: Sufficiently Active (04/23/2023)   Exercise Vital Sign    Days of Exercise per Week: 7 days    Minutes of Exercise per Session: 30 min  Stress: No Stress Concern Present (04/23/2023)   [employer] of Occupational Health - Occupational Stress Questionnaire    Feeling of Stress : Not at all  Social Connections: Moderately Integrated (04/23/2023)   Social Connection and Isolation Panel [NHANES]    Frequency of Communication with Friends and Family: More than three times a week    Frequency of Social Gatherings with Friends and Family: More than three times a week    Attends Religious Services: More than 4 times per year    Active Member of [employer] or Organizations: Yes    Attends Engineer, structural: More than 4 times per year    Marital Status: Divorced  Recent Concern: Social Connections - Moderately Isolated (02/24/2023)   Social Connection and Isolation Panel [NHANES]    Frequency of Communication with Friends and Family: More than three times a week    Frequency of Social Gatherings with Friends and Family: Twice a week    Attends Religious Services: Never    Database administrator or Organizations: Yes    Attends Engineer, structural: More than 4 times per year    Marital Status: Divorced     Vitals:   08/06/23 1026  BP: 136/70  Pulse: 76  Resp: 16  SpO2: 99%   Body mass index is 24.36 kg/m.  Physical Exam Vitals and nursing note reviewed.  Constitutional:      General: She is not in acute distress.    Appearance: She is well-developed.  HENT:     Head: Normocephalic and atraumatic.     Comments: Mild pain upon palpation of right temporal area, right above ear. No deformity. I do not appreciate scalp laceration or wound.    Mouth/Throat:     Mouth: Mucous membranes are moist.     Pharynx: Oropharynx is clear.  Eyes:     Conjunctiva/sclera: Conjunctivae normal.  Cardiovascular:     Rate and Rhythm: Normal rate and regular rhythm.     Heart sounds: Murmur (SEM I/VI RUSB) heard.  Pulmonary:     Effort: Pulmonary effort is normal. No respiratory distress.     Breath sounds: Normal breath sounds.  Abdominal:     Palpations: Abdomen is soft. There is no mass.     Tenderness: There is no abdominal tenderness.  Musculoskeletal:     Cervical back: No tenderness or bony tenderness. Pain with movement (Mild.) present.     Thoracic back: No tenderness or bony tenderness.  Lymphadenopathy:     Cervical: No cervical adenopathy.  Skin:    General: Skin is warm.     Findings: No erythema or rash.  Neurological:     Mental Status: She is alert and oriented to person, place, and time.     Cranial Nerves: No cranial nerve deficit.     Comments: Mildly unstable gait, not assisted. RLE weakness, wearing a brace, polio sequela. Rest with no significant deficit.   Psychiatric:        Mood and Affect: Affect normal. Mood is anxious.    ASSESSMENT AND PLAN:  Ms. Mcanany was seen today for ER  follow up for fall.   Fall, subsequent encounter Fall precautions discussed. Some of her co morbilities and medications can increase risk. Head and cervical CT + left hand X ray negative for acute process. I do not appreciate scalp abrasion or excoriation not significant edema on  area of trauma. Consider using her cane more consistently.  Acute post-traumatic headache, not intractable ? Head concussion. Explained that it is not uncommon to have headache after head trauma. She has had head injuries from falls before. Examination doe snot suggest a serious process. She is going to be with family in the next few days. I do not think imaging needs to be repeated, if headache is not any better in 2 weeks ,it can be considered. Clearly instructed about warning signs.  Hypothyroidism (acquired) We discussed some side effects of Levothyroxine, for now no changes in med dose. We can plan on checking TSH again next visit and adjust if necessary, goal TSH 2-3.  Primary hypertension Reporting lower BP's at home. Continue same dose Benazepril. Continue monitoring BP at home.  I spent a total of 33 minutes in both face to face and non face to face activities for this visit on the date of this encounter. During this time history was obtained and documented, examination was performed, prior labs/imaging reviewed, and assessment/plan discussed.  Return if symptoms worsen or fail to improve, for keep next appointment.  I, Rolla Etienne Wierda, acting as a scribe for Sylver Vantassell [place], MD., have documented all relevant documentation on the behalf of Shamone Winzer [place], MD, as directed by  Luby Seamans [place], MD while in the presence of Jaquell Seddon [place], MD.   I, Chyler Creely [place], MD, have reviewed all documentation for this visit. The documentation on 08/09/23 for the exam, diagnosis, procedures, and orders are all accurate and complete.  Eulah Walkup G. [place], MD  [facility]. [place] office.

## 2023-08-06 NOTE — Patient Instructions (Addendum)
A few things to remember from today's visit:  Fall, subsequent encounter  Acute post-traumatic headache, not intractable  Hypothyroidism (acquired) No changes today. Monitor for worsening headache or new symptoms, in which case you need to seek immediate medical attention.   If you need refills for medications you take chronically, please call your pharmacy. Do not use My Chart to request refills or for acute issues that need immediate attention. If you send a my chart message, it may take a few days to be addressed, specially if I am not in the office.  Please be sure medication list is accurate. If a new problem present, please set up appointment sooner than planned today.

## 2023-08-21 ENCOUNTER — Other Ambulatory Visit: Payer: Self-pay | Admitting: Family Medicine

## 2023-08-21 DIAGNOSIS — F419 Anxiety disorder, unspecified: Secondary | ICD-10-CM

## 2023-08-27 ENCOUNTER — Telehealth: Payer: Self-pay

## 2023-08-27 DIAGNOSIS — W19XXXD Unspecified fall, subsequent encounter: Secondary | ICD-10-CM

## 2023-08-27 NOTE — Telephone Encounter (Signed)
Referral to PT has been placed due to frequent falls.

## 2023-08-27 NOTE — Addendum Note (Signed)
Addended by: Weyman Croon E on: 08/27/2023 12:26 PM   Modules accepted: Orders

## 2023-08-27 NOTE — Telephone Encounter (Signed)
Copied from CRM [id number]. Topic: Referral - Request for Referral >> Aug 27, 2023 10:38 AM Sim Boast F wrote: Did the patient discuss referral with their provider in the last year? Yes (If No - schedule appointment) (If Yes - send message)  Appointment offered? No  Type of order/referral and detailed reason for visit: Patient Had A Fall  Preference of office, provider, location:  [facility] Fax Number: [phone]  If referral order, have you been seen by this specialty before? No (If Yes, this issue or another issue? When? Where?  Can we respond through MyChart? No

## 2023-08-28 DIAGNOSIS — Z9181 History of falling: Secondary | ICD-10-CM | POA: Diagnosis not present

## 2023-08-28 DIAGNOSIS — M6281 Muscle weakness (generalized): Secondary | ICD-10-CM | POA: Diagnosis not present

## 2023-08-28 DIAGNOSIS — M25662 Stiffness of left knee, not elsewhere classified: Secondary | ICD-10-CM | POA: Diagnosis not present

## 2023-08-28 DIAGNOSIS — R262 Difficulty in walking, not elsewhere classified: Secondary | ICD-10-CM | POA: Diagnosis not present

## 2023-08-29 ENCOUNTER — Ambulatory Visit: Payer: Medicare Other

## 2023-08-29 ENCOUNTER — Ambulatory Visit (HOSPITAL_COMMUNITY)
Admission: RE | Admit: 2023-08-29 | Discharge: 2023-08-29 | Disposition: A | Discharge status: Home / Self Care | Payer: Medicare Other | Source: Ambulatory Visit | Attending: Family Medicine | Admitting: Family Medicine

## 2023-08-29 ENCOUNTER — Encounter: Payer: Self-pay | Admitting: Family Medicine

## 2023-08-29 ENCOUNTER — Ambulatory Visit (INDEPENDENT_AMBULATORY_CARE_PROVIDER_SITE_OTHER): Payer: Medicare Other | Admitting: Family Medicine

## 2023-08-29 VITALS — BP 120/60 | HR 75 | Resp 16 | Ht 65.0 in | Wt 150.1 lb

## 2023-08-29 DIAGNOSIS — S8992XA Unspecified injury of left lower leg, initial encounter: Secondary | ICD-10-CM

## 2023-08-29 DIAGNOSIS — G44319 Acute post-traumatic headache, not intractable: Secondary | ICD-10-CM

## 2023-08-29 DIAGNOSIS — F330 Major depressive disorder, recurrent, mild: Secondary | ICD-10-CM | POA: Insufficient documentation

## 2023-08-29 DIAGNOSIS — F419 Anxiety disorder, unspecified: Secondary | ICD-10-CM | POA: Diagnosis not present

## 2023-08-29 DIAGNOSIS — R296 Repeated falls: Secondary | ICD-10-CM | POA: Diagnosis not present

## 2023-08-29 DIAGNOSIS — Z96652 Presence of left artificial knee joint: Secondary | ICD-10-CM | POA: Diagnosis not present

## 2023-08-29 DIAGNOSIS — R519 Headache, unspecified: Secondary | ICD-10-CM | POA: Diagnosis not present

## 2023-08-29 DIAGNOSIS — M25562 Pain in left knee: Secondary | ICD-10-CM | POA: Diagnosis not present

## 2023-08-29 NOTE — Progress Notes (Signed)
ACUTE VISIT Chief Complaint  Patient presents with   Fall    Had a fall on 11/19 and 12/5. Headache from fall    HPI: Crystal Levy is a 82 y.o. female, PMHx significant for HTN, anxiety, PAD, OA, and hx of polio, who is here today complaining of falls.  Last fall was on 07/29/23.  She was seen for ED follow up on 08/06/23.  Pt had her 2nd fall on 08/14/23 while walking down the stairs while carrying 11-12 books in one arm.  She fell backwards after missing the last step and hit her head on the left occipital-parietal area, same area she had trauma previous fall.. She did not seek medical attention.  She fell with her legs under her and has had left knee swelling, bruising, and discoloration (blue-ish) since then.  Has not fully recovered full range of motion in left knee, has been working with PT. Additionally injured her right shoulder and had a laceration on her left elbow, which has healed well.   Pt endorses blurry vision, dizziness, right-sided head pressure, and worsening headaches since the fall on 12/5.  Last followed up with her eye doctor prior to her 1st fall.   Head and cervical CT done on 07/29/2023 after a fall: 1. No evidence of acute abnormality intracranially or in the cervical spine. 2. Scalp vertex contusion  She ranks her pain a 7-8/10 with this fall and states with her last fall her pain was a 3-4/10. States that she couldn't drive for 5 days following her fall due to difficulty seeing road signs with small prints and getting her leg into the car due to swelling.  States she feels "loopy and unstable", unable to walk around her neighborhood due to feeling unsafe while walking.  Her chiropractor told her the left side of her back was "inflamed."  Swelling in left knee and back has overall improved but not fully resolved.  No nausea, vomiting, numbness, or tingling.   Depression: Pt endorses decreased motivation, "little depression"  in the last 1-2  months.  GAD: She has increased her dose of citalopram to 30mg  instead of 20mg  for about 6 weeks.  Moods have slightly improved, pt has more motivation.  She is also on lorazepam 1 mg daily at bedtime.     08/06/2023   10:34 AM 06/04/2023    9:40 AM 04/23/2023    2:25 PM 11/04/2022   11:59 AM 05/08/2022   11:56 AM  Depression screen PHQ 2/9  Decreased Interest 1 1 0 0 1  Down, Depressed, Hopeless 1 2 0 0 1  PHQ - 2 Score 2 3 0 0 2  Altered sleeping 1 0   1  Tired, decreased energy 0 2   2  Change in appetite 0 0   0  Feeling bad or failure about yourself  0 0   0  Trouble concentrating 0 2   0  Moving slowly or fidgety/restless 0 1   0  Suicidal thoughts 0 0   0  PHQ-9 Score 3 8   5   Difficult doing work/chores Somewhat difficult Somewhat difficult   Somewhat difficult   Review of Systems  Constitutional:  Positive for fatigue.  HENT:  Negative for nosebleeds and sore throat.   Eyes:  Positive for visual disturbance. Negative for photophobia.  Respiratory:  Negative for cough, shortness of breath and wheezing.   Gastrointestinal:  Negative for abdominal pain, nausea and vomiting.  Genitourinary:  Negative for decreased  urine volume, dysuria and hematuria.  Musculoskeletal:  Positive for arthralgias and gait problem.  Skin:  Negative for rash and wound.  Neurological:  Negative for syncope and facial asymmetry.  Psychiatric/Behavioral:  Negative for confusion and hallucinations. The patient is nervous/anxious.   See other pertinent positives and negatives in HPI.  Current Outpatient Medications on File Prior to Visit  Medication Sig Dispense Refill   benazepril (LOTENSIN) 20 MG tablet TAKE ONE-HALF TABLET BY MOUTH IN THE MORNING AND 1 TABLET BY  MOUTH IN THE EVENING 150 tablet 2   Cholecalciferol (VITAMIN D-3) 1000 UNITS CAPS Take 1,000 Units by mouth daily.     citalopram (CELEXA) 20 MG tablet TAKE 1 TABLET BY MOUTH DAILY (Patient taking differently: Take 20 mg by mouth daily.  Patient states she increased dosage from 20mg  to 30mg  daily) 90 tablet 3   Iodine, Kelp, (KELP PO) Take 1 tablet by mouth daily.     levothyroxine (SYNTHROID) 88 MCG tablet TAKE 1 TABLET BY MOUTH DAILY  BEFORE BREAKFAST 100 tablet 2   LORazepam (ATIVAN) 1 MG tablet TAKE 1 TABLET BY MOUTH AT BEDTIME AS NEEDED FOR ANXIETY 30 tablet 3   magnesium hydroxide (MILK OF MAGNESIA) 400 MG/5ML suspension Take 5 mLs by mouth daily as needed for mild constipation.     Multiple Vitamin (MULTIVITAMIN WITH MINERALS) TABS tablet Take 1 tablet by mouth daily.     polyethylene glycol (MIRALAX / GLYCOLAX) 17 g packet Take 17 g by mouth daily.     rosuvastatin (CRESTOR) 5 MG tablet Take 5 mg by mouth 3 (three) times a week.     Selenium (SELENIMIN PO) Take 1 tablet by mouth daily.     No current facility-administered medications on file prior to visit.   Past Medical History:  Diagnosis Date   Anxiety    Arthritis    Bicuspid aortic valve    Depression    Heart murmur    Hypertension    Hypothyroidism    PAD (peripheral artery disease) (HCC)    Palpitations    Pneumonia    Polio    Thyroid disease    Allergies  Allergen Reactions   Clindamycin/Lincomycin Diarrhea and Other (See Comments)    C-Diff   Fluticasone     Headache and nosebleed    Azithromycin     Unknown   Egg-Derived Products     Per allergy testing   Milk-Related Compounds     Per allergy testing   Penicillins Rash   Sulfa Antibiotics Diarrhea   Fenofibrate Rash    Social History   Socioeconomic History   Marital status: Divorced    Spouse name: Not on file   Number of children: 2   Years of education: Not on file   Highest education level: Master's degree (e.g., MA, MS, MEng, MEd, MSW, MBA)  Occupational History    Employer: [employer]  Tobacco Use   Smoking status: Never   Smokeless tobacco: Never   Tobacco comments:    Rare smoker in the distant past.   Vaping Use   Vaping status: Never Used   Substance and Sexual Activity   Alcohol use: Yes    Alcohol/week: 1.0 standard drink of alcohol    Types: 1 Glasses of wine per week    Comment: wine 3 times per week   Drug use: No   Sexual activity: Not on file  Other Topics Concern   Not on file  Social History Narrative   Lives  alone. Retired Physicist, medical.    Social Drivers of Corporate investment banker Strain: Low Risk  (08/28/2023)   Overall Financial Resource Strain (CARDIA)    Difficulty of Paying Living Expenses: Not very hard  Food Insecurity: No Food Insecurity (08/28/2023)   Hunger Vital Sign    Worried About Running Out of Food in the Last Year: Never true    Ran Out of Food in the Last Year: Never true  Transportation Needs: No Transportation Needs (08/28/2023)   PRAPARE - Administrator, Civil Service (Medical): No    Lack of Transportation (Non-Medical): No  Physical Activity: Insufficiently Active (08/28/2023)   Exercise Vital Sign    Days of Exercise per Week: 5 days    Minutes of Exercise per Session: 20 min  Stress: No Stress Concern Present (08/28/2023)   [employer] of Occupational Health - Occupational Stress Questionnaire    Feeling of Stress : Only a little  Social Connections: Moderately Isolated (08/28/2023)   Social Connection and Isolation Panel [NHANES]    Frequency of Communication with Friends and Family: Three times a week    Frequency of Social Gatherings with Friends and Family: Once a week    Attends Religious Services: Never    Database administrator or Organizations: Yes    Attends Engineer, structural: More than 4 times per year    Marital Status: Divorced   Vitals:   08/29/23 1456  BP: 120/60  Pulse: 75  Resp: 16  SpO2: 99%   Body mass index is 24.98 kg/m.  Physical Exam Vitals and nursing note reviewed.  Constitutional:      General: She is not in acute distress.    Appearance: She is well-developed.  HENT:     Head:  Normocephalic and atraumatic.     Comments: Mild right temporal scalp tenderness upon palpation. I do not appreciate deformities, hematomas, or wound. Eyes:     Conjunctiva/sclera: Conjunctivae normal.  Cardiovascular:     Rate and Rhythm: Normal rate and regular rhythm.     Heart sounds: Murmur (SEM I/VI RUSB) heard.  Pulmonary:     Effort: Pulmonary effort is normal. No respiratory distress.     Breath sounds: Normal breath sounds.  Abdominal:     Palpations: Abdomen is soft. There is no mass.     Tenderness: There is no abdominal tenderness.  Musculoskeletal:     Cervical back: No tenderness or bony tenderness.     Thoracic back: No tenderness or bony tenderness.  Skin:    General: Skin is warm.     Findings: No erythema or rash.  Neurological:     Mental Status: She is alert and oriented to person, place, and time.     Cranial Nerves: No cranial nerve deficit.     Comments: Antalgic , unstable gait, assisted with a cane. RLE weakness, wearing a brace, polio sequela. Rest with no significant deficit.   Psychiatric:        Mood and Affect: Affect normal. Mood is anxious.   ASSESSMENT AND PLAN: Crystal Levy was seen today for falls.  Acute post-traumatic headache, not intractable She has residual headache from fall on 07/29/2023, aggravated by recent fall on 08/14/2023. Examination does not suggest acute neurologic process, headache getting worse and reporting associated blurry vision. She does not want to go to the ED at this time. Will arrange head CT. She was clearly instructed about warning signs. She is going  to arrange appointment with her eye care provider.  -     CT HEAD WO CONTRAST ( ); Future  Frequent falls Assessment & Plan: We discussed fall precautions, avoid situations that can increase the risk for falls. Currently she is doing PT.  Injury of left knee, initial encounter Pain aggravated by recent fall and has improved, still moderate limitation  of range of motion and effusion. Left knee x-ray ordered today. Continue following with orthopedics for chronic knee pain.  -     DG Knee Complete 4 Views Left; Future  Anxiety disorder, unspecified type Assessment & Plan: She increase dose of Celexa from 20 mg to 30 mg, 1.5 tablet, 6 days ago and has noticed some improvement in motivation. She is not interested in increasing dose of Celexa to 40 mg at this time. Continue lorazepam 1 mg daily at bedtime as needed.  Depression, major, recurrent, mild (HCC)  Has been mildly worse for the past 1 to 2 months, improved some after increasing Celexa 20 mg from 1 tab to 1.5 tab. she is not interested in increasing Celexa to 40 mg for now. Keep next follow up appt.  Return if symptoms worsen or fail to improve, for keep next appointment.  I, Isabelle Course, acting as a scribe for Kedarius Aloisi [place], MD., have documented all relevant documentation on the behalf of Davaughn Hillyard [place], MD, as directed by  Xana Bradt [place], MD while in the presence of Shahrzad Koble [place], MD.  I, Patrica Mendell [place], MD, have reviewed all documentation for this visit. The documentation on 08/29/23 for the exam, diagnosis, procedures, and orders are all accurate and complete.  Shantara Goosby G. [place], MD  [facility]. [place] office.

## 2023-08-29 NOTE — Assessment & Plan Note (Addendum)
Has been mildly worse for the past 1 to 2 months, improved some after increasing Celexa 20 mg from 1 tab to 1.5 tab. she is not interested in increasing Celexa to 40 mg for now. Keep next follow up appt.

## 2023-08-29 NOTE — Patient Instructions (Addendum)
A few things to remember from today's visit:  Acute post-traumatic headache, not intractable - Plan: CT HEAD WO CONTRAST ( )  Frequent falls  Injury of left knee, initial encounter - Plan: DG Knee Complete 4 Views Left  Anxiety disorder, unspecified type  Continue Tylenol 500 mg 2-3 times per day. Topical voltaren on left knee will also help. Monitor for new symptoms. Follow with ortho. Continue PT.  If you need refills for medications you take chronically, please call your pharmacy. Do not use My Chart to request refills or for acute issues that need immediate attention. If you send a my chart message, it may take a few days to be addressed, specially if I am not in the office.  Please be sure medication list is accurate. If a new problem present, please set up appointment sooner than planned today.

## 2023-08-29 NOTE — Assessment & Plan Note (Signed)
We discussed fall precautions, avoid situations that can increase the risk for falls. Currently she is doing PT.

## 2023-08-29 NOTE — Assessment & Plan Note (Addendum)
Currently on Celexa 20 mg 1.5 tablet daily. She is not interested in increasing dose of Celexa to 40 mg at this time. Continue lorazepam 1 mg daily at bedtime as needed.

## 2023-09-04 DIAGNOSIS — R262 Difficulty in walking, not elsewhere classified: Secondary | ICD-10-CM | POA: Diagnosis not present

## 2023-09-04 DIAGNOSIS — M25662 Stiffness of left knee, not elsewhere classified: Secondary | ICD-10-CM | POA: Diagnosis not present

## 2023-09-04 DIAGNOSIS — Z9181 History of falling: Secondary | ICD-10-CM | POA: Diagnosis not present

## 2023-09-04 DIAGNOSIS — M6281 Muscle weakness (generalized): Secondary | ICD-10-CM | POA: Diagnosis not present

## 2023-09-05 DIAGNOSIS — S060X0A Concussion without loss of consciousness, initial encounter: Secondary | ICD-10-CM | POA: Diagnosis not present

## 2023-09-09 DIAGNOSIS — M25662 Stiffness of left knee, not elsewhere classified: Secondary | ICD-10-CM | POA: Diagnosis not present

## 2023-09-09 DIAGNOSIS — M6281 Muscle weakness (generalized): Secondary | ICD-10-CM | POA: Diagnosis not present

## 2023-09-09 DIAGNOSIS — R262 Difficulty in walking, not elsewhere classified: Secondary | ICD-10-CM | POA: Diagnosis not present

## 2023-09-09 DIAGNOSIS — Z9181 History of falling: Secondary | ICD-10-CM | POA: Diagnosis not present

## 2023-09-11 DIAGNOSIS — M25662 Stiffness of left knee, not elsewhere classified: Secondary | ICD-10-CM | POA: Diagnosis not present

## 2023-09-11 DIAGNOSIS — M6281 Muscle weakness (generalized): Secondary | ICD-10-CM | POA: Diagnosis not present

## 2023-09-11 DIAGNOSIS — R262 Difficulty in walking, not elsewhere classified: Secondary | ICD-10-CM | POA: Diagnosis not present

## 2023-09-11 DIAGNOSIS — Z9181 History of falling: Secondary | ICD-10-CM | POA: Diagnosis not present

## 2023-09-15 DIAGNOSIS — M25662 Stiffness of left knee, not elsewhere classified: Secondary | ICD-10-CM | POA: Diagnosis not present

## 2023-09-15 DIAGNOSIS — M6281 Muscle weakness (generalized): Secondary | ICD-10-CM | POA: Diagnosis not present

## 2023-09-15 DIAGNOSIS — Z9181 History of falling: Secondary | ICD-10-CM | POA: Diagnosis not present

## 2023-09-15 DIAGNOSIS — R262 Difficulty in walking, not elsewhere classified: Secondary | ICD-10-CM | POA: Diagnosis not present

## 2023-09-16 DIAGNOSIS — S060X0D Concussion without loss of consciousness, subsequent encounter: Secondary | ICD-10-CM | POA: Diagnosis not present

## 2023-09-22 DIAGNOSIS — Z9181 History of falling: Secondary | ICD-10-CM | POA: Diagnosis not present

## 2023-09-22 DIAGNOSIS — R262 Difficulty in walking, not elsewhere classified: Secondary | ICD-10-CM | POA: Diagnosis not present

## 2023-09-22 DIAGNOSIS — M6281 Muscle weakness (generalized): Secondary | ICD-10-CM | POA: Diagnosis not present

## 2023-09-22 DIAGNOSIS — M25662 Stiffness of left knee, not elsewhere classified: Secondary | ICD-10-CM | POA: Diagnosis not present

## 2023-09-23 DIAGNOSIS — H5203 Hypermetropia, bilateral: Secondary | ICD-10-CM | POA: Diagnosis not present

## 2023-09-23 DIAGNOSIS — H52223 Regular astigmatism, bilateral: Secondary | ICD-10-CM | POA: Diagnosis not present

## 2023-09-23 DIAGNOSIS — H524 Presbyopia: Secondary | ICD-10-CM | POA: Diagnosis not present

## 2023-09-24 DIAGNOSIS — M25662 Stiffness of left knee, not elsewhere classified: Secondary | ICD-10-CM | POA: Diagnosis not present

## 2023-09-24 DIAGNOSIS — R262 Difficulty in walking, not elsewhere classified: Secondary | ICD-10-CM | POA: Diagnosis not present

## 2023-09-24 DIAGNOSIS — Z9181 History of falling: Secondary | ICD-10-CM | POA: Diagnosis not present

## 2023-09-24 DIAGNOSIS — M6281 Muscle weakness (generalized): Secondary | ICD-10-CM | POA: Diagnosis not present

## 2023-09-25 DIAGNOSIS — S060X0D Concussion without loss of consciousness, subsequent encounter: Secondary | ICD-10-CM | POA: Diagnosis not present

## 2023-09-30 DIAGNOSIS — M25662 Stiffness of left knee, not elsewhere classified: Secondary | ICD-10-CM | POA: Diagnosis not present

## 2023-09-30 DIAGNOSIS — Z9181 History of falling: Secondary | ICD-10-CM | POA: Diagnosis not present

## 2023-09-30 DIAGNOSIS — M6281 Muscle weakness (generalized): Secondary | ICD-10-CM | POA: Diagnosis not present

## 2023-09-30 DIAGNOSIS — R262 Difficulty in walking, not elsewhere classified: Secondary | ICD-10-CM | POA: Diagnosis not present

## 2023-10-02 DIAGNOSIS — S060X0D Concussion without loss of consciousness, subsequent encounter: Secondary | ICD-10-CM | POA: Diagnosis not present

## 2023-10-09 DIAGNOSIS — S060X0D Concussion without loss of consciousness, subsequent encounter: Secondary | ICD-10-CM | POA: Diagnosis not present

## 2023-10-15 DIAGNOSIS — M6281 Muscle weakness (generalized): Secondary | ICD-10-CM | POA: Diagnosis not present

## 2023-10-15 DIAGNOSIS — R262 Difficulty in walking, not elsewhere classified: Secondary | ICD-10-CM | POA: Diagnosis not present

## 2023-10-15 DIAGNOSIS — M25662 Stiffness of left knee, not elsewhere classified: Secondary | ICD-10-CM | POA: Diagnosis not present

## 2023-10-15 DIAGNOSIS — Z9181 History of falling: Secondary | ICD-10-CM | POA: Diagnosis not present

## 2023-10-17 DIAGNOSIS — S060X0D Concussion without loss of consciousness, subsequent encounter: Secondary | ICD-10-CM | POA: Diagnosis not present

## 2023-10-20 NOTE — Progress Notes (Signed)
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   Subjective:  Crystal Levy is an 83 year old right-handed female with HTN, hypothyroidism, PAD, palpitations, and history of polio who presents for headache.  History supplemented by referring provider's note.  She has a history of polio with residual right lower greater than upper hemiparesis.  She has had multiple recurrent falls since contracting polio at 83 years old.  She uses a cane outside the house.  Most recently she had a fall in November and again in December, in which she lost her balance and fell striking the back of her head.  Following the fall in December, she developed right sided headache with blurred vision and dizziness.  She also noticed that her balance was worse.  CT head on 08/29/2023 personally reviewed revealed partial empty sella and normal craniocervical junction but no acute intracranial abnormality.  She went to physical therapy to help with gait and vestibular rehab.  She has noticed improvement in dizziness and balance.  Headaches have since resolved.     PAST MEDICAL HISTORY: Past Medical History:  Diagnosis Date   Anxiety    Arthritis    Bicuspid aortic valve    Depression    Heart murmur    Hypertension    Hypothyroidism    PAD (peripheral artery disease) (HCC)    Palpitations    Pneumonia    Polio    Thyroid disease     PAST SURGICAL HISTORY: Past Surgical History:  Procedure Laterality Date   ABDOMINAL HYSTERECTOMY     BREAST ENHANCEMENT SURGERY     BREAST SURGERY     BUNIONECTOMY Left    CARDIAC  CATHETERIZATION     EYE SURGERY     HAMMER TOE SURGERY Right    HERNIA REPAIR     JOINT REPLACEMENT     left hip   KNEE SURGERY     meniscus   RADICAL VAGINAL HYSTERECTOMY  09/10/1991   TONSILLECTOMY     TOTAL KNEE ARTHROPLASTY Left 12/20/2014   Procedure: LEFT TOTAL KNEE ARTHROPLASTY;  Surgeon: Marcene Corning, MD;  Location: [facility] OR;  Service: Orthopedics;  Laterality: Left;   XI ROBOT ASSISTED RECTOPEXY N/A 01/29/2023   Procedure: XI ROBOT ASSISTED RECTOPEXY;  Surgeon: Romie Levee, MD;  Location: [facility];  Service: General;  Laterality: N/A;    MEDICATIONS: Current Outpatient Medications on File Prior to Visit  Medication Sig Dispense Refill   benazepril (LOTENSIN) 20 MG tablet TAKE ONE-HALF TABLET BY MOUTH IN THE MORNING AND 1 TABLET BY  MOUTH IN THE EVENING 150 tablet 2   Cholecalciferol (VITAMIN D-3) 1000 UNITS CAPS Take 1,000 Units by mouth daily.     citalopram (CELEXA) 20 MG tablet TAKE 1 TABLET BY MOUTH DAILY (Patient taking differently: Take 20 mg by mouth daily. Patient states she increased dosage from 20mg  to 30mg  daily) 90 tablet 3   Iodine, Kelp, (KELP PO) Take 1 tablet by mouth daily.     levothyroxine (SYNTHROID) 88 MCG tablet TAKE 1 TABLET BY MOUTH DAILY  BEFORE BREAKFAST 100 tablet 2   LORazepam (ATIVAN) 1 MG tablet TAKE 1 TABLET BY MOUTH AT BEDTIME AS NEEDED  FOR ANXIETY 30 tablet 3   magnesium hydroxide (MILK OF MAGNESIA) 400 MG/5ML suspension Take 5 mLs by mouth daily as needed for mild constipation.     polyethylene glycol (MIRALAX / GLYCOLAX) 17 g packet Take 17 g by mouth daily.     rosuvastatin (CRESTOR) 5 MG tablet Take 5 mg by mouth 3 (three) times a week.     Selenium (SELENIMIN PO) Take 1 tablet by mouth daily.     No current facility-administered medications on file prior to visit.     ALLERGIES: Allergies  Allergen Reactions   Clindamycin/Lincomycin Diarrhea and Other (See Comments)    C-Diff   Fluticasone     Headache and nosebleed     Azithromycin     Unknown   Egg-Derived Products     Per allergy testing   Milk-Related Compounds     Per allergy testing   Penicillins Rash   Sulfa Antibiotics Diarrhea   Fenofibrate Rash    FAMILY HISTORY: Family History  Problem Relation Age of Onset   CAD Mother 11   Aortic stenosis Mother     Objective:  Blood pressure (!) 135/58, pulse 68, height 5\' 5"  (1.651 m), weight 148 lb (67.1 kg), SpO2 99%. General: No acute distress.  Patient appears well-groomed.   Head:  Normocephalic/atraumatic Eyes:  fundi examined but not visualized Neck: supple, no paraspinal tenderness, full range of motion Back: No paraspinal tenderness Heart: murmur, regular rate and rhythm Neurological Exam: Mental status: alert and oriented to person, place, and time, speech fluent and not dysarthric, language intact. Cranial nerves: CN I: not tested CN II: pupils equal, round and reactive to light, visual fields intact CN III, IV, VI:  full range of motion, no nystagmus, no ptosis CN V: facial sensation intact. CN VII: upper and lower face symmetric CN VIII: hearing intact CN IX, X: gag intact, uvula midline CN XI: sternocleidomastoid and trapezius muscles intact CN XII: tongue midline Bulk & Tone: Decreased bulk in right upper and lower extremities, no fasciculations. Motor:  muscle strength 5-/5 right upper extremity, 4/5 proximal right lower extremity, right foot drop.  5/5 on left.  Sensation:  Pinprick sensation reduced in feet (right worse than left); vibratory sensation mildly reduced in feet.. Deep Tendon Reflexes:  trace throughout,  toes downgoing.   Finger to nose testing:  Without dysmetria.   Gait:  Right hemiparetic gait.  Romberg negative.    Thank you for allowing me to take part in the care of this patient.  Shon Millet, DO  CC:  Betty [place], MD  Eula Listen, MD

## 2023-10-21 ENCOUNTER — Ambulatory Visit: Payer: Medicare Other | Admitting: Neurology

## 2023-10-21 ENCOUNTER — Encounter: Payer: Self-pay | Admitting: Neurology

## 2023-10-21 VITALS — BP 135/58 | HR 68 | Ht 65.0 in | Wt 148.0 lb

## 2023-10-21 DIAGNOSIS — F0781 Postconcussional syndrome: Secondary | ICD-10-CM

## 2023-10-21 DIAGNOSIS — R2681 Unsteadiness on feet: Secondary | ICD-10-CM | POA: Diagnosis not present

## 2023-10-21 DIAGNOSIS — Z8612 Personal history of poliomyelitis: Secondary | ICD-10-CM | POA: Diagnosis not present

## 2023-10-21 NOTE — Patient Instructions (Signed)
I think that the worsening balance is due to concussion sustained after the last fall.  Continue physical therapy as directed.  I do not suspect any other acute neurologic cause.

## 2023-10-22 DIAGNOSIS — S060X0D Concussion without loss of consciousness, subsequent encounter: Secondary | ICD-10-CM | POA: Diagnosis not present

## 2023-11-03 DIAGNOSIS — M6281 Muscle weakness (generalized): Secondary | ICD-10-CM | POA: Diagnosis not present

## 2023-11-03 DIAGNOSIS — Z9181 History of falling: Secondary | ICD-10-CM | POA: Diagnosis not present

## 2023-11-03 DIAGNOSIS — M25662 Stiffness of left knee, not elsewhere classified: Secondary | ICD-10-CM | POA: Diagnosis not present

## 2023-11-07 DIAGNOSIS — M25552 Pain in left hip: Secondary | ICD-10-CM | POA: Diagnosis not present

## 2023-11-10 DIAGNOSIS — S060X0D Concussion without loss of consciousness, subsequent encounter: Secondary | ICD-10-CM | POA: Diagnosis not present

## 2023-11-13 ENCOUNTER — Other Ambulatory Visit: Payer: Self-pay | Admitting: Family Medicine

## 2023-11-13 DIAGNOSIS — E785 Hyperlipidemia, unspecified: Secondary | ICD-10-CM

## 2023-11-22 ENCOUNTER — Encounter: Payer: Self-pay | Admitting: Family Medicine

## 2023-11-24 DIAGNOSIS — S060X0D Concussion without loss of consciousness, subsequent encounter: Secondary | ICD-10-CM | POA: Diagnosis not present

## 2023-12-11 ENCOUNTER — Telehealth: Payer: Self-pay | Admitting: *Deleted

## 2023-12-11 DIAGNOSIS — J45909 Unspecified asthma, uncomplicated: Secondary | ICD-10-CM

## 2023-12-11 NOTE — Telephone Encounter (Signed)
 Copied from CRM [id number]. Topic: Referral - Request for Referral >> Dec 11, 2023 10:45 AM Florestine Avers wrote: Did the patient discuss referral with their provider in the last year? No (If No - schedule appointment) (If Yes - send message)  Appointment offered? No  Type of order/referral and detailed reason for visit: Patient is requesting to see a Event organiser  Preference of office, provider, location: [facility]   If referral order, have you been seen by this specialty before? No (If Yes, this issue or another issue? When? Where?  Can we respond through MyChart? No Patient wants to go through allergy immunizations due to the allergy season. Patient is requesting a call back once referral is placed so she knows she can call and schedule her appointment.

## 2023-12-18 ENCOUNTER — Other Ambulatory Visit: Payer: Self-pay | Admitting: Family Medicine

## 2023-12-18 DIAGNOSIS — F419 Anxiety disorder, unspecified: Secondary | ICD-10-CM

## 2023-12-22 ENCOUNTER — Other Ambulatory Visit: Payer: Self-pay | Admitting: Family Medicine

## 2023-12-22 NOTE — Telephone Encounter (Signed)
 LOV: 08/2023 Last filled: 11/21/23  Can you refill in PCP's absence?

## 2023-12-23 ENCOUNTER — Encounter: Payer: Self-pay | Admitting: Internal Medicine

## 2023-12-23 ENCOUNTER — Ambulatory Visit: Admitting: Internal Medicine

## 2023-12-23 VITALS — BP 128/68 | HR 72 | Temp 98.1°F | Ht 65.0 in | Wt 147.6 lb

## 2023-12-23 DIAGNOSIS — J4520 Mild intermittent asthma, uncomplicated: Secondary | ICD-10-CM | POA: Diagnosis not present

## 2023-12-23 DIAGNOSIS — L272 Dermatitis due to ingested food: Secondary | ICD-10-CM | POA: Diagnosis not present

## 2023-12-23 DIAGNOSIS — J3089 Other allergic rhinitis: Secondary | ICD-10-CM | POA: Diagnosis not present

## 2023-12-23 MED ORDER — CETIRIZINE HCL 10 MG PO TABS: 10.0000 mg | ORAL_TABLET | Freq: Every day | ORAL | 5 refills | Status: DC

## 2023-12-23 MED ORDER — ALBUTEROL SULFATE HFA 108 (90 BASE) MCG/ACT IN AERS: 2.0000 | INHALATION_SPRAY | Freq: Four times a day (QID) | RESPIRATORY_TRACT | 1 refills | Status: DC | PRN

## 2023-12-23 MED ORDER — FLUTICASONE PROPIONATE 50 MCG/ACT NA SUSP: 2.0000 | Freq: Every day | NASAL | 5 refills | Status: DC

## 2023-12-23 NOTE — Progress Notes (Signed)
 NEW PATIENT  Date of Service/Encounter:  12/23/23  Consult requested by: [place], Betty G, MD   Subjective:   Crystal Levy (DOB: [birth year]) is a 83 y.o. female who presents to the clinic on 12/23/2023 with a chief complaint of Advice Only (Having issues with seasonal allergies pt was dx asthma about 20 years pt has an inhaler but hasn't had to use it, she stated that has some shortness of breath ) .    History obtained from: chart review and patient.   Asthma:  Diagnosed by an Proofreader in [place]. Reports some SOB with activity.  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   Has PRN Albuterol that she uses infrequently. Few times daytime symptoms in past month, none nighttime awakenings in past month Using rescue inhaler: few times a month Limitations to daily activity: mild 0 ED visits/UC visits and 0 oral steroids in the past year 0 number of lifetime hospitalizations, 0 number of lifetime intubations.  Identified Triggers: exercise Prior PFTs or spirometry: none Previously used therapies: none Current regimen:  Maintenance: none  Rescue: Albuterol 2 puffs q4-6 hrs PRN  Rhinitis:  Started around elementary school. Worsened in the last few years.   Symptoms include: eye redness and swelling, itchy mouth, congestion, drainage, runny nose, sneezing  Occurs seasonally-Spring/Fall  Potential triggers: pollens  Treatments tried:  Benadryl/tylenol  Flonase/Zyrtec helping Claritin does not help   Previous allergy testing: yes when she was very little and about 20 years ago History of sinus surgery: no Nonallergic triggers: none    Food Reactions: Trouble with vomiting, maybe rashes? 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  Now eating eggs in small amounts though.    Reviewed:  08/2022: LV EF 60-65%, mild MR.   08/05/2022: saw Dr [facility] for palpitations, PVD, HTN,  hypothyroidism, HLD, questionable bicuspid AV, bruit.  Planning for carotid duplex, TTE, ABI.    06/04/2023: seen by Dr [place] PCP for SOB, HTN, Anxiety, Lightheadedness, Headaches. HLD, chest pain, hypothyroidism, pre DM.    11/12/2001: [facility]; positive to trees, grasses, weeds, molds, DM Past Medical History: Past Medical History:  Diagnosis Date   Anxiety    Arthritis    Bicuspid aortic valve    Depression    Heart murmur    Hypertension    Hypothyroidism    PAD (peripheral artery disease) (HCC)    Palpitations    Pneumonia    Polio    Thyroid disease     Past Surgical History: Past Surgical History:  Procedure Laterality Date   ABDOMINAL HYSTERECTOMY     BREAST ENHANCEMENT SURGERY     BREAST SURGERY     BUNIONECTOMY Left    CARDIAC CATHETERIZATION     EYE SURGERY     HAMMER TOE SURGERY Right    HERNIA REPAIR     JOINT REPLACEMENT     left hip   KNEE SURGERY     meniscus   RADICAL VAGINAL HYSTERECTOMY  09/10/1991   TONSILLECTOMY     TOTAL KNEE ARTHROPLASTY Left 12/20/2014   Procedure: LEFT TOTAL KNEE ARTHROPLASTY;  Surgeon: Marcene Corning, MD;  Location: [facility] OR;  Service: Orthopedics;  Laterality: Left;   XI ROBOT ASSISTED RECTOPEXY N/A 01/29/2023   Procedure: XI ROBOT ASSISTED RECTOPEXY;  Surgeon: Romie Levee, MD;  Location: [facility];  Service: General;  Laterality: N/A;    Family History:  Family History  Problem Relation Age of Onset   Migraines Mother    CAD Mother 90   Aortic stenosis Mother     Social History:  Flooring in bedroom: carpet Pets: none Tobacco use/exposure: none Job: retired Visual merchandiser   Medication List:  Allergies as of 12/23/2023       Reactions   Clindamycin/lincomycin Diarrhea, Other (See Comments)   C-Diff   Fluticasone    Headache and nosebleed   Azithromycin    Unknown   Egg-derived Products    Per allergy testing   Milk-related Compounds    Per allergy testing   Penicillins  Rash   Sulfa Antibiotics Diarrhea   Fenofibrate Rash        Medication List        Accurate as of December 23, 2023  3:37 PM. If you have any questions, ask your nurse or doctor.          albuterol 108 (90 Base) MCG/ACT inhaler Commonly known as: VENTOLIN HFA Inhale 2 puffs into the lungs every 6 (six) hours as needed for shortness of breath or wheezing. Started by: Kandice Orleans   benazepril 20 MG tablet Commonly known as: LOTENSIN TAKE ONE-HALF TABLET BY MOUTH IN THE MORNING AND 1 TABLET BY  MOUTH IN THE EVENING   cetirizine 10 MG tablet Commonly known as: ZyrTEC Allergy Take 1 tablet (10 mg total) by mouth daily. Started by: Kandice Orleans   citalopram 20 MG tablet Commonly known as: CELEXA TAKE 1 TABLET BY MOUTH DAILY What changed: additional instructions   fluticasone 50 MCG/ACT nasal spray Commonly known as: FLONASE Place 2 sprays into both nostrils daily. Started by: Kandice Orleans   KELP PO Take 1 tablet by mouth daily.   levothyroxine 88 MCG tablet Commonly known as: SYNTHROID TAKE 1 TABLET BY MOUTH DAILY  BEFORE BREAKFAST   LORazepam 1 MG tablet Commonly known as: ATIVAN TAKE 1 TABLET BY MOUTH AT BEDTIME AS NEEDED FOR ANXIETY   magnesium hydroxide 400 MG/5ML suspension Commonly known as: MILK OF MAGNESIA Take 5 mLs by mouth daily as needed for mild constipation.   polyethylene glycol 17 g packet Commonly known as: MIRALAX / GLYCOLAX Take 17 g by mouth daily.   rosuvastatin 5 MG tablet Commonly known as: CRESTOR TAKE 1 TABLET BY MOUTH DAILY   SELENIMIN PO Take 1 tablet by mouth daily.   Vitamin D-3 25 MCG (1000 UT) Caps Take 1,000 Units by mouth daily.         REVIEW OF SYSTEMS: Pertinent positives and negatives discussed in HPI.   Objective:   Physical Exam: BP 128/68   Pulse 72   Temp 98.1 F (36.7 C) (Temporal)   Ht 5\' 5"  (1.651 m)   Wt 147 lb 9.6 oz (67 kg)   SpO2 94%   BMI 24.56 kg/m  Body mass index is 24.56  kg/m. GEN: alert, well developed HEENT: clear conjunctiva, TM grey and translucent, nose with + mild inferior turbinate hypertrophy, pink nasal mucosa, slight clear rhinorrhea, + cobblestoning HEART: regular rate and rhythm, no murmur LUNGS: clear to auscultation bilaterally, no coughing, unlabored respiration ABDOMEN: soft, non distended  SKIN: no rashes or lesions  Spirometry:  Tracings reviewed. Her effort: Good reproducible efforts. FVC: 3.78L, 109% predicted FEV1: 2.28L, 119% predicted FEV1/FVC ratio: 82% Interpretation: Spirometry consistent with normal pattern.  Please see scanned spirometry results for details.  Assessment:   1. Other allergic rhinitis   2. Dermatitis due to  ingested food   3. Mild intermittent asthma without complication     Plan/Recommendations:  Other Allergic Rhinitis: - Due to turbinate hypertrophy, seasonal symptoms and unresponsive to over the counter meds, will perform skin testing to identify aeroallergen triggers.   - Use nasal saline rinses before nose sprays such as with Neilmed Sinus Rinse.  Use distilled water.   - Use Flonase 2 sprays each nostril daily. Aim upward and outward. - Use Zyrtec 10 mg daily.   Mild Intermittent Asthma: - MDI technique discussed.  Spirometry today was normal.  Discussed dyspnea is likely related to exercise intolerance; generally polio related dyspnea would be more severe resulting in muscle weakness.  - Rescue inhaler: Albuterol 2 puffs every 4-6 hours as needed for respiratory symptoms of shortness of breath, or wheezing Asthma control goals:  Full participation in all desired activities (may need albuterol before activity) Albuterol use two times or less a week on average (not counting use with activity) Cough interfering with sleep two times or less a month Oral steroids no more than once a year No hospitalizations   Food Reactions - Avoid eggs and cow's milk for now.  We will test at next visit. Low  suspicion for true IgE mediated allergy, likely intolerance. - initial rxn: in childhood, dx with milk/egg allergy, thinks due to vomiting/ rashes?    Hold all anti-histamines (Xyzal, Allegra, Zyrtec, Claritin, Benadryl, Pepcid) 3 days prior to next visit.   Follow up: 930 on 4/23 for skin testing 1-55, eggs, cow's milk   Kristen Petri, MD [facility] 

## 2023-12-23 NOTE — Patient Instructions (Addendum)
 Other Allergic Rhinitis: - Use nasal saline rinses before nose sprays such as with Neilmed Sinus Rinse.  Use distilled water.   - Use Flonase 2 sprays each nostril daily. Aim upward and outward. - Use Zyrtec 10 mg daily.   Mild Intermittent Asthma:  - Rescue inhaler: Albuterol 2 puffs every 4-6 hours as needed for respiratory symptoms of shortness of breath, or wheezing Asthma control goals:  Full participation in all desired activities (may need albuterol before activity) Albuterol use two times or less a week on average (not counting use with activity) Cough interfering with sleep two times or less a month Oral steroids no more than once a year No hospitalizations   Food Reactions - Avoid eggs and cow's milk for now.  We will test at next visit.    Hold all anti-histamines (Xyzal, Allegra, Zyrtec, Claritin, Benadryl, Pepcid) 3 days prior to next visit.   Follow up: 930 on 4/23 for skin testing 1-55, eggs, cow's milk

## 2023-12-28 ENCOUNTER — Other Ambulatory Visit: Payer: Self-pay | Admitting: Family Medicine

## 2024-01-06 ENCOUNTER — Ambulatory Visit: Admitting: Internal Medicine

## 2024-01-06 DIAGNOSIS — L272 Dermatitis due to ingested food: Secondary | ICD-10-CM | POA: Diagnosis not present

## 2024-01-06 DIAGNOSIS — J301 Allergic rhinitis due to pollen: Secondary | ICD-10-CM | POA: Diagnosis not present

## 2024-01-06 MED ORDER — AZELASTINE HCL 0.1 % NA SOLN
2.0000 | Freq: Two times a day (BID) | NASAL | 5 refills | Status: AC | PRN
Start: 2024-01-06 — End: ?

## 2024-01-06 NOTE — Progress Notes (Signed)
 FOLLOW UP Date of Service/Encounter:  01/06/24   Subjective:  Crystal Levy (DOB: 1940-12-14) is a 83 y.o. female [facility] for follow up for skin testing.   History obtained from: chart review and patient.  Anti histamines held.   Past Medical History: Past Medical History:  Diagnosis Date   Anxiety    Arthritis    Bicuspid aortic valve    Depression    Heart murmur    Hypertension    Hypothyroidism    PAD (peripheral artery disease) (HCC)    Palpitations    Pneumonia    Polio    Thyroid  disease     Objective:  There were no vitals taken for this visit. There is no height or weight on file to calculate BMI. Physical Exam: GEN: alert, well developed HEENT: clear conjunctiva, MMM LUNGS: unlabored respiration   Skin Testing:  Skin prick testing was placed, which includes aeroallergens/foods, histamine control, and saline control.  Verbal consent was obtained prior to placing test.  Patient tolerated procedure well.  Allergy testing results were read and interpreted by myself, documented by clinical staff. Adequate positive and negative control.  Positive results to:  Results discussed with patient/family.  Airborne Adult Perc - 01/06/24 0951     Time Antigen Placed 9811    Allergen Manufacturer Floyd Hutchinson    Location Back    Number of Test 55    Panel 1 Select    1. Control-Buffer 50% Glycerol Negative    2. Control-Histamine 3+    3. Bahia Negative    4. [place] 2+    5. Johnson Negative    6. [place]  Blue Negative    7. Meadow Fescue Negative    8. Perennial Rye Negative    9. Timothy Negative    10. Ragweed Mix Negative    11. Cocklebur Negative    12. Plantain,  English Negative    13. Baccharis Negative    14. Dog Fennel Negative    15. Russian Thistle Negative    16. Lamb's Quarters Negative    17. Sheep Sorrell Negative    18. Rough Pigweed Negative    19. Marsh Elder, Rough Negative    20.  Mugwort, Common Negative    21. Box, Elder Negative    22. Cedar, red Negative    23. Sweet Gum Negative    24. Pecan Pollen Negative    25. Pine Mix Negative    26. Walnut, Black Pollen Negative    27. Red Mulberry Negative    28. Ash Mix Negative    29. Birch Mix 3+    30. Beech American 3+    31. Cottonwood, [place] Negative    32. Hickory, White Negative    33. Maple Mix Negative    34. Oak, [place] Mix 3+    35. Sycamore Eastern Negative    36. Alternaria Alternata Negative    37. Cladosporium Herbarum Negative    38. Aspergillus Mix Negative    39. Penicillium Mix Negative    40. Bipolaris Sorokiniana (Helminthosporium) Negative    41. Drechslera Spicifera (Curvularia) Negative    42. Mucor Plumbeus Negative    43. Fusarium Moniliforme Negative    44. Aureobasidium Pullulans (pullulara) Negative    45. Rhizopus Oryzae Negative    46. Botrytis Cinera Negative    47. Epicoccum Nigrum Negative    48. Phoma Betae Negative    49. Dust Mite Mix Negative  50. Cat Hair 10,000 BAU/ml Negative    51.  Dog Epithelia Negative    52. Mixed Feathers Negative    53. Horse Epithelia Negative    54. Cockroach, German Negative    55. Tobacco Leaf Negative             13 Food Perc - 01/06/24 0951       Test Information   Time Antigen Placed 1610    Allergen Manufacturer Floyd Hutchinson    Location Back    Number of allergen test 2      Food   5. Milk, Cow Negative    7. Egg White, Chicken Negative             Intradermal - 01/06/24 1050     Time Antigen Placed 1050    Allergen Manufacturer Greer    Location Back    Number of Test 14    Control Negative    Bahia Negative    Johnson Negative    7 Grass Negative    Ragweed Mix Negative    Weed Mix Negative    Mold 1 Negative    Mold 2 Negative    Mold 3 Negative    Mold 4 Negative    Mite Mix Negative    Cat Negative    Dog Negative    Cockroach Negative              Assessment:   1. Dermatitis due to  ingested food   2. Seasonal allergic rhinitis due to pollen     Plan/Recommendations:  Allergic Rhinitis: - Due to turbinate hypertrophy, seasonal symptoms and unresponsive to over the counter meds, will perform skin testing to identify aeroallergen triggers.   - Positive skin test 12/2023: trees, grasses  - Avoidance measures discussed. - Use nasal saline rinses before nose sprays such as with Neilmed Sinus Rinse.  Use distilled water.   - Use Flonase  2 sprays each nostril daily. Aim upward and outward. - Use Azelastine 2 sprays each nostril twice daily as needed for runny nose, drainage, sneezing, congestion. Aim upward and outward. - Use Zyrtec  10 mg daily.  - Consider allergy shots as long term control of your symptoms by teaching your immune system to be more tolerant of your allergy triggers    Mild Intermittent Asthma: - Spirometry previously was normal.  Discussed dyspnea is likely related to exercise intolerance; generally polio related dyspnea would be more severe resulting in significant muscle weakness.  - Rescue inhaler: Albuterol  2 puffs every 4-6 hours as needed for respiratory symptoms of shortness of breath, or wheezing Asthma control goals:  Full participation in all desired activities (may need albuterol  before activity) Albuterol  use two times or less a week on average (not counting use with activity) Cough interfering with sleep two times or less a month Oral steroids no more than once a year No hospitalizations     Food Reactions - Okay to reintroduce eggs and cow's milk. - SPT 12/2023: negative to eggs and milk  - initial rxn: in childhood, dx with milk/egg allergy, thinks due to vomiting/ rashes but not sure       Return in about 3 months (around 04/06/2024).  Kristen Petri, MD [facility] 

## 2024-01-06 NOTE — Patient Instructions (Addendum)
 Allergic Rhinitis: - Positive skin test 12/2023: trees, grasses  - Use nasal saline rinses before nose sprays such as with Neilmed Sinus Rinse.  Use distilled water.   - Use Flonase  2 sprays each nostril daily. Aim upward and outward. - Use Azelastine 2 sprays each nostril twice daily as needed for runny nose, drainage, sneezing, congestion. Aim upward and outward. - Use Zyrtec  10 mg daily.  - Consider allergy shots as long term control of your symptoms by teaching your immune system to be more tolerant of your allergy triggers  Mild Intermittent Asthma: - Rescue inhaler: Albuterol  2 puffs every 4-6 hours as needed for respiratory symptoms of shortness of breath, or wheezing Asthma control goals:  Full participation in all desired activities (may need albuterol  before activity) Albuterol  use two times or less a week on average (not counting use with activity) Cough interfering with sleep two times or less a month Oral steroids no more than once a year No hospitalizations     Food Reactions - Okay to reintroduce eggs and cow's milk.

## 2024-01-14 ENCOUNTER — Encounter (HOSPITAL_COMMUNITY): Payer: Self-pay

## 2024-01-17 ENCOUNTER — Other Ambulatory Visit: Payer: Self-pay | Admitting: Family Medicine

## 2024-01-17 DIAGNOSIS — F419 Anxiety disorder, unspecified: Secondary | ICD-10-CM

## 2024-01-19 ENCOUNTER — Ambulatory Visit: Payer: Medicare Other | Admitting: Neurology

## 2024-01-21 ENCOUNTER — Ambulatory Visit (INDEPENDENT_AMBULATORY_CARE_PROVIDER_SITE_OTHER): Admitting: Family Medicine

## 2024-01-21 ENCOUNTER — Encounter: Payer: Self-pay | Admitting: Family Medicine

## 2024-01-21 VITALS — BP 136/70 | HR 65 | Temp 99.2°F | Resp 16 | Ht 65.0 in | Wt 147.2 lb

## 2024-01-21 DIAGNOSIS — E785 Hyperlipidemia, unspecified: Secondary | ICD-10-CM | POA: Diagnosis not present

## 2024-01-21 DIAGNOSIS — I10 Essential (primary) hypertension: Secondary | ICD-10-CM | POA: Diagnosis not present

## 2024-01-21 DIAGNOSIS — F330 Major depressive disorder, recurrent, mild: Secondary | ICD-10-CM

## 2024-01-21 DIAGNOSIS — F419 Anxiety disorder, unspecified: Secondary | ICD-10-CM

## 2024-01-21 DIAGNOSIS — G4700 Insomnia, unspecified: Secondary | ICD-10-CM

## 2024-01-21 LAB — BASIC METABOLIC PANEL WITH GFR
BUN: 10 mg/dL (ref 6–23)
CO2: 29 meq/L (ref 19–32)
Calcium: 9.5 mg/dL (ref 8.4–10.5)
Chloride: 101 meq/L (ref 96–112)
Creatinine, Ser: 0.58 mg/dL (ref 0.40–1.20)
GFR: 83.88 mL/min (ref >60.00)
Glucose, Bld: 91 mg/dL (ref 70–99)
Potassium: 4.3 meq/L (ref 3.5–5.1)
Sodium: 137 meq/L (ref 135–145)

## 2024-01-21 MED ORDER — CITALOPRAM HYDROBROMIDE 40 MG PO TABS: 40.0000 mg | ORAL_TABLET | Freq: Every day | ORAL | 2 refills | Status: DC

## 2024-01-21 NOTE — Patient Instructions (Addendum)
 A few things to remember from today's visit:  Primary hypertension - Plan: Basic metabolic panel with GFR  Anxiety disorder, unspecified type - Plan: citalopram  (CELEXA ) 40 MG tablet  Depression, major, recurrent, mild (HCC) - Plan: citalopram  (CELEXA ) 40 MG tablet  Insomnia, unspecified type No changes today. If you need refills for medications you take chronically, please call your pharmacy. Do not use My Chart to request refills or for acute issues that need immediate attention. If you send a my chart message, it may take a few days to be addressed, specially if I am not in the office.  Please be sure medication list is accurate. If a new problem present, please set up appointment sooner than planned today.

## 2024-01-21 NOTE — Assessment & Plan Note (Addendum)
 Sleeping better. She is going to try to decrease dose of Lorazepam  1 mg, recommend decreasing dose to 1/2 tab 2 times per week, then 3 times per week and so forth. Continue good sleep hygiene. F/U in 6 months.

## 2024-01-21 NOTE — Assessment & Plan Note (Signed)
 She reports improvement after increasing dose of Celexa  from 20 mg to 40 mg, no changes.  We can reevaluate in 6 months and see if we can decrease dose back to 20 mg.

## 2024-01-21 NOTE — Progress Notes (Unsigned)
 HPI: Crystal Levy is a 83 y.o. female with a PMHx significant for HTN, hypothyroidism, HLD, insomnia, depression, anxiety, PAD, OA, and hx of polio, who is here today for follow up after her appointment with immunology.   Last seen on 08/29/2023. She had appointments with immunology on 4/15 and 4/29. She also saw neurology on 2/11. States that she does not need to follow with Dr Barbee Lew, unless a new problem presents.  Allergies: Since her last visit she has established with immunologist and had allergy  testing done.  States that skin allergy  test reveled allergies to grass and trees. Her next appointment will be with a different provider on 7/29.  She has not been walking for 2 months due to allergies.   Hypertension:  Medications: Currently on benazepril  10 mg in the morning and 20 mg at night.  She has been checking her BP occasionally at home with a high reading of 148/101. Most of them are between 120s-130s/60s-70s.  Negative for unusual or severe headache, visual changes, exertional chest pain, dyspnea, new focal weakness, or edema.  Lab Results  Component Value Date   CREATININE 0.56 06/05/2023   BUN 9 06/05/2023   NA 138 06/05/2023   K 4.9 06/05/2023   CL 103 06/05/2023   CO2 29 06/05/2023   Anxiety/insomnia/depression:  Currently on Celexa  20 mg  2 tabs daily, she increased dose about a months ago because worsening symptoms. Reports improvement, has not noted side effect from medication. She is also on Lorazepam  1 mg at bedtime to help with insomnia.  She says she is sleeping 8 hours per night, and feels rested in general.   Hyperlipidemia: She increased frequency of rosuvastatin  5 mg  from 3 times per week to 4 times per week.  She had headache when she took medication daily but has not had any at the current dose. Lab Results  Component Value Date   CHOL 191 06/05/2023   HDL 51.10 06/05/2023   LDLCALC 113 (H) 06/05/2023   LDLDIRECT 111.0 12/26/2020    TRIG 138.0 06/05/2023   CHOLHDL 4 06/05/2023   Hypothyroidism:  Currently on levothyroxine  88 mcg daily. Lab Results  Component Value Date   TSH 0.95 06/05/2023   Review of Systems  Respiratory:  Negative for cough and wheezing.   Gastrointestinal:  Negative for abdominal pain, nausea and vomiting.  Endocrine: Negative for cold intolerance and heat intolerance.  Genitourinary:  Negative for decreased urine volume, dysuria and hematuria.  Musculoskeletal:  Positive for arthralgias and gait problem.  Skin:  Negative for rash.  Allergic/Immunologic: Positive for environmental allergies.  Neurological:  Negative for syncope and facial asymmetry.  Psychiatric/Behavioral:  Negative for confusion and hallucinations.   See other pertinent positives and negatives in HPI.  Current Outpatient Medications on File Prior to Visit  Medication Sig Dispense Refill   albuterol  (VENTOLIN  HFA) 108 (90 Base) MCG/ACT inhaler Inhale 2 puffs into the lungs every 6 (six) hours as needed for shortness of breath or wheezing. 8 g 1   azelastine  (ASTELIN ) 0.1 % nasal spray Place 2 sprays into both nostrils 2 (two) times daily as needed for allergies. Use in each nostril as directed 30 mL 5   benazepril  (LOTENSIN ) 20 MG tablet TAKE ONE-HALF TABLET BY MOUTH IN THE MORNING AND 1 TABLET BY  MOUTH IN THE EVENING 150 tablet 2   cetirizine  (ZYRTEC  ALLERGY ) 10 MG tablet Take 1 tablet (10 mg total) by mouth daily. 30 tablet 5   Cholecalciferol (VITAMIN D-3)  1000 UNITS CAPS Take 1,000 Units by mouth daily.     fluticasone  (FLONASE ) 50 MCG/ACT nasal spray Place 2 sprays into both nostrils daily. 16 g 5   Iodine, Kelp, (KELP PO) Take 1 tablet by mouth daily.     levothyroxine  (SYNTHROID ) 88 MCG tablet TAKE 1 TABLET BY MOUTH DAILY  BEFORE BREAKFAST 100 tablet 2   LORazepam  (ATIVAN ) 1 MG tablet TAKE 1 TABLET BY MOUTH AT BEDTIME AS NEEDED FOR ANXIETY 30 tablet 3   magnesium hydroxide (MILK OF MAGNESIA) 400 MG/5ML suspension  Take 5 mLs by mouth daily as needed for mild constipation.     polyethylene glycol (MIRALAX  / GLYCOLAX ) 17 g packet Take 17 g by mouth daily.     rosuvastatin  (CRESTOR ) 5 MG tablet TAKE 1 TABLET BY MOUTH DAILY 100 tablet 2   Selenium (SELENIMIN PO) Take 1 tablet by mouth daily.     No current facility-administered medications on file prior to visit.    Past Medical History:  Diagnosis Date   Anxiety    Arthritis    Bicuspid aortic valve    Depression    Heart murmur    Hypertension    Hypothyroidism    PAD (peripheral artery disease) (HCC)    Palpitations    Pneumonia    Polio    Thyroid  disease    Allergies  Allergen Reactions   Clindamycin/Lincomycin Diarrhea and Other (See Comments)    C-Diff   Fluticasone      Headache and nosebleed    Azithromycin     Unknown   Egg-Derived Products     Per allergy  testing   Milk-Related Compounds     Per allergy  testing   Penicillins Rash   Sulfa Antibiotics Diarrhea   Fenofibrate Rash    Social History   Socioeconomic History   Marital status: Divorced    Spouse name: Not on file   Number of children: 2   Years of education: Not on file   Highest education level: Master's degree (e.g., MA, MS, MEng, MEd, MSW, MBA)  Occupational History    Employer: [employer]  Tobacco Use   Smoking status: Never   Smokeless tobacco: Never   Tobacco comments:    Rare smoker in the distant past.   Vaping Use   Vaping status: Never Used  Substance and Sexual Activity   Alcohol use: Yes    Alcohol/week: 1.0 standard drink of alcohol    Types: 1 Glasses of wine per week    Comment: wine 3 times per week   Drug use: No   Sexual activity: Not on file  Other Topics Concern   Not on file  Social History Narrative   Lives alone. 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   Social Drivers of Health   Financial Resource Strain: Low Risk  (08/28/2023)   Overall Financial Resource Strain (CARDIA)    Difficulty of  Paying Living Expenses: Not very hard  Food Insecurity: No Food Insecurity (08/28/2023)   Hunger Vital Sign    Worried About Running Out of Food in the Last Year: Never true    Ran Out of Food in the Last Year: Never true  Transportation Needs: No Transportation Needs (08/28/2023)   PRAPARE - Administrator, Civil Service (Medical): No    Lack of Transportation (Non-Medical): No  Physical Activity: Insufficiently Active (08/28/2023)   Exercise Vital Sign    Days of Exercise per Week: 5 days    Minutes of Exercise  per Session: 20 min  Stress: No Stress Concern Present (08/28/2023)   [employer] of Occupational Health - Occupational Stress Questionnaire    Feeling of Stress : Only a little  Social Connections: Moderately Isolated (08/28/2023)   Social Connection and Isolation Panel [NHANES]    Frequency of Communication with Friends and Family: Three times a week    Frequency of Social Gatherings with Friends and Family: Once a week    Attends Religious Services: Never    Database administrator or Organizations: Yes    Attends Engineer, structural: More than 4 times per year    Marital Status: Divorced    Vitals:   01/21/24 1035  BP: 136/70  Pulse: 65  Resp: 16  Temp: 99.2 F (37.3 C)  SpO2: 98%   Body mass index is 24.5 kg/m.  Physical Exam Vitals and nursing note reviewed.  Constitutional:      General: She is not in acute distress.    Appearance: She is well-developed.  HENT:     Head: Normocephalic and atraumatic.     Mouth/Throat:     Mouth: Mucous membranes are moist.     Pharynx: Oropharynx is clear. Uvula midline.  Eyes:     Conjunctiva/sclera: Conjunctivae normal.  Cardiovascular:     Rate and Rhythm: Normal rate and regular rhythm.     Heart sounds: No murmur heard. Pulmonary:     Effort: Pulmonary effort is normal. No respiratory distress.     Breath sounds: Normal breath sounds.  Abdominal:     Palpations: Abdomen is soft.  There is no mass.     Tenderness: There is no abdominal tenderness.  Musculoskeletal:     Right lower leg: No edema.     Left lower leg: No edema.  Lymphadenopathy:     Cervical: No cervical adenopathy.  Skin:    General: Skin is warm.     Findings: No erythema or rash.  Neurological:     Mental Status: She is alert and oriented to person, place, and time.     Comments: Mildly unstable gait, not assisted today. RLE weakness, wearing a brace, polio sequela. Rest with no significant deficit.    Psychiatric:        Mood and Affect: Mood and affect normal.   ASSESSMENT AND PLAN:  Crystal Levy was seen today for immunology follow up.   Orders Placed This Encounter  Procedures   Basic metabolic panel with GFR   Lab Results  Component Value Date   NA 137 01/21/2024   CL 101 01/21/2024   K 4.3 01/21/2024   CO2 29 01/21/2024   BUN 10 01/21/2024   CREATININE 0.58 01/21/2024   GFR 83.88 01/21/2024   CALCIUM  9.5 01/21/2024   ALBUMIN 4.2 06/05/2023   GLUCOSE 91 01/21/2024   Insomnia, unspecified type Assessment & Plan: Sleeping better. She is going to try to decrease dose of Lorazepam  1 mg, recommend decreasing dose to 1/2 tab 2 times per week, then 3 times per week and so forth. Continue good sleep hygiene. F/U in 6 months.   Primary hypertension Assessment & Plan: BP adequately controlled, home BP readings 120-130/60-70's most of the time. Continue benazepril  20 mg 1/2 tablet in the morning and 1 tablet at night as well as low-salt diet. Continue monitoring BP regularly. F/U in 6 months.  Orders: -     Basic metabolic panel with GFR; Future  Anxiety disorder, unspecified type Assessment & Plan: She reports improvement. Continue  Celexa  40 mg, tablet strength change and new prescription sent to the pharmacy. We can reevaluate in 6 months and determine if we can decrease Celexa  back to 20 mg daily. Continue lorazepam  1 mg, which also helps her sleep, she is going to try  to decrease dose. Follow-up in 6 months.  Orders: -     Citalopram  Hydrobromide; Take 1 tablet (40 mg total) by mouth daily.  Dispense: 90 tablet; Refill: 2  Depression, major, recurrent, mild (HCC) Assessment & Plan: She reports improvement after increasing dose of Celexa  from 20 mg to 40 mg, no changes.  We can reevaluate in 6 months and see if we can decrease dose back to 20 mg.  Orders: -     Citalopram  Hydrobromide; Take 1 tablet (40 mg total) by mouth daily.  Dispense: 90 tablet; Refill: 2  Hyperlipidemia, unspecified hyperlipidemia type Assessment & Plan: Continue rosuvastatin  4 mg 4 times per week, which she has tolerated well, not causing side effects. Will plan on checking cholesterol next visit.   Return in about 6 months (around 07/23/2024) for chronic problems.  I, Crystal Levy, acting as a scribe for Crystal Heindel [place], MD., have documented all relevant documentation on the behalf of Crystal Callegari [place], MD, as directed by  Crystal Belland [place], MD while in the presence of Crystal Mooneyham [place], MD.   I, Crystal Husby [place], MD, have reviewed all documentation for this visit. The documentation on 01/21/24 for the exam, diagnosis, procedures, and orders are all accurate and complete.  Crystal Marquard G. [place], MD  [facility] [place] office.

## 2024-01-21 NOTE — Assessment & Plan Note (Signed)
 She reports improvement. Continue Celexa  40 mg, tablet strength change and new prescription sent to the pharmacy. We can reevaluate in 6 months and determine if we can decrease Celexa  back to 20 mg daily. Continue lorazepam  1 mg, which also helps her sleep, she is going to try to decrease dose. Follow-up in 6 months.

## 2024-01-21 NOTE — Assessment & Plan Note (Signed)
 BP adequately controlled, home BP readings 120-130/60-70's most of the time. Continue benazepril  20 mg 1/2 tablet in the morning and 1 tablet at night as well as low-salt diet. Continue monitoring BP regularly. F/U in 6 months.

## 2024-01-21 NOTE — Assessment & Plan Note (Signed)
 Continue rosuvastatin  4 mg 4 times per week, which she has tolerated well, not causing side effects. Will plan on checking cholesterol next visit.

## 2024-01-22 ENCOUNTER — Ambulatory Visit: Payer: Self-pay | Admitting: Family Medicine

## 2024-02-08 ENCOUNTER — Other Ambulatory Visit: Payer: Self-pay | Admitting: Internal Medicine

## 2024-02-11 DIAGNOSIS — H16223 Keratoconjunctivitis sicca, not specified as Sjogren's, bilateral: Secondary | ICD-10-CM | POA: Diagnosis not present

## 2024-02-11 DIAGNOSIS — H1045 Other chronic allergic conjunctivitis: Secondary | ICD-10-CM | POA: Diagnosis not present

## 2024-03-29 ENCOUNTER — Telehealth: Payer: Self-pay | Admitting: Family Medicine

## 2024-03-29 ENCOUNTER — Other Ambulatory Visit: Payer: Self-pay | Admitting: Family Medicine

## 2024-03-29 MED ORDER — LEVOTHYROXINE SODIUM 88 MCG PO TABS: 88.0000 ug | ORAL_TABLET | Freq: Every day | ORAL | 2 refills | Status: AC

## 2024-03-29 NOTE — Telephone Encounter (Unsigned)
 Copied from CRM [id number]. Topic: Clinical - Medication Refill >> Mar 29, 2024  9:15 AM Tiffany S wrote: Medication: levothyroxine  (SYNTHROID ) 88 MCG tablet [[id number]]  Has the patient contacted their pharmacy? Yes (Agent: If no, request that the patient contact the pharmacy for the refill. If patient does not wish to contact the pharmacy document the reason why and proceed with request.) (Agent: If yes, when and what did the pharmacy advise?)  This is the patient's preferred pharmacy:  [facility] - [place], [place] - [address] [facility] [address] [place] [address] [place] [address] Phone: [phone] Fax: [phone]   Is this the correct pharmacy for this prescription? Yes If no, delete pharmacy and type the correct one.   Has the prescription been filled recently? Yes  Is the patient out of the medication? Yes  Has the patient been seen for an appointment in the last year OR does the patient have an upcoming appointment? Yes  Can we respond through MyChart? Yes  Agent: Please be advised that Rx refills may take up to 3 business days. We ask that you follow-up with your pharmacy.

## 2024-03-29 NOTE — Telephone Encounter (Signed)
 Copied from CRM [id number]. Topic: Clinical - Medication Refill >> Mar 29, 2024  9:15 AM Tiffany S wrote: Medication: levothyroxine  (SYNTHROID ) 88 MCG tablet [[id number]]  Has the patient contacted their pharmacy? Yes (Agent: If no, request that the patient contact the pharmacy for the refill. If patient does not wish to contact the pharmacy document the reason why and proceed with request.) (Agent: If yes, when and what did the pharmacy advise?)  This is the patient's preferred pharmacy:  [facility] - [place], [place] - [address] [facility] [address] [place] [address] [place] [address] Phone: [phone] Fax: [phone]   Is this the correct pharmacy for this prescription? Yes If no, delete pharmacy and type the correct one.   Has the prescription been filled recently? Yes  Is the patient out of the medication? Yes  Has the patient been seen for an appointment in the last year OR does the patient have an upcoming appointment? Yes  Can we respond through MyChart? Yes  Agent: Please be advised that Rx refills may take up to 3 business days. We ask that you follow-up with your pharmacy.

## 2024-04-05 ENCOUNTER — Ambulatory Visit: Payer: Self-pay

## 2024-04-05 NOTE — Telephone Encounter (Signed)
 FYI Only or Action Required?: FYI only for provider.  Patient was last seen in primary care on 01/21/2024 by [place], Crystal G, MD.  Called Nurse Triage reporting swallowing dificulty .  Symptoms began several months ago.  Interventions attempted: Rest, hydration, or home remedies and Other: cutting food into smaller pieces.  Symptoms are: gradually worsening.  Triage Disposition: See PCP Within 2 Weeks  Patient/caregiver understands and will follow disposition?: Yes  **Appt. Scheduled for 7/29**               Copied from CRM #[id number]. Topic: Clinical - Red Word Triage >> Apr 05, 2024  9:28 AM Jasmin Levy wrote: [facility] that prompted transfer to Nurse Triage: Pt ix experiencing an enlarged thyroid  accompanied by pain and choking while swallowing and discomfort when her T-shirt rubs against it Reason for Disposition  Swallowing difficulty is a chronic symptom (recurrent or ongoing AND present > 4 weeks)  Answer Assessment - Initial Assessment Questions 1. DESCRIPTION: Tell me more about this problem. Are you  having trouble swallowing liquids, solids, or both? Any trouble with swallowing saliva (spit)?     Trouble swallowing has increased, food and liquids   2. SEVERITY: How bad is the swallowing difficulty?  (Scale 1-10; or mild, moderate, severe)     Mild   3. ONSET: When did the swallowing problems begin?      On Thyroid  medication, she reports difficulty swallowing x 6 months. She reports chocking when eating, swallowing liquids.   4. CAUSE: What do you think is causing the problem?  (e.Levy., dry mouth, food or pill stuck in throat, mouth pain, sore throat, progression of disease process such as dementia or Parkinson's disease).      Thyroid    5. CHRONIC or RECURRENT: Is this a new problem for you?  If No, ask: How long have you had this problem? (e.Levy., days, weeks, months)      Chronic x 6 months, last episode was x 2 weeks ago.  6. OTHER SYMPTOMS:  Do you have any other symptoms? (e.Levy., chest pain, difficulty breathing, mouth sores, sore throat, swollen tongue, chest pain)   She reports an enlarged thyroid . Currently taking Levothyroxine  88mg .  Protocols used: Swallowing Difficulty-A-AH

## 2024-04-06 ENCOUNTER — Ambulatory Visit (INDEPENDENT_AMBULATORY_CARE_PROVIDER_SITE_OTHER): Admitting: Family Medicine

## 2024-04-06 ENCOUNTER — Encounter: Payer: Self-pay | Admitting: Family Medicine

## 2024-04-06 ENCOUNTER — Ambulatory Visit: Admitting: Allergy and Immunology

## 2024-04-06 VITALS — BP 126/70 | HR 76 | Resp 16 | Ht 64.0 in | Wt 143.5 lb

## 2024-04-06 VITALS — BP 122/62 | HR 64 | Temp 98.3°F | Resp 16 | Ht 64.57 in | Wt 142.9 lb

## 2024-04-06 DIAGNOSIS — K219 Gastro-esophageal reflux disease without esophagitis: Secondary | ICD-10-CM | POA: Diagnosis not present

## 2024-04-06 DIAGNOSIS — I10 Essential (primary) hypertension: Secondary | ICD-10-CM | POA: Diagnosis not present

## 2024-04-06 DIAGNOSIS — R1310 Dysphagia, unspecified: Secondary | ICD-10-CM | POA: Diagnosis not present

## 2024-04-06 DIAGNOSIS — E039 Hypothyroidism, unspecified: Secondary | ICD-10-CM

## 2024-04-06 DIAGNOSIS — J301 Allergic rhinitis due to pollen: Secondary | ICD-10-CM

## 2024-04-06 DIAGNOSIS — R509 Fever, unspecified: Secondary | ICD-10-CM | POA: Diagnosis not present

## 2024-04-06 DIAGNOSIS — J4520 Mild intermittent asthma, uncomplicated: Secondary | ICD-10-CM | POA: Diagnosis not present

## 2024-04-06 DIAGNOSIS — E739 Lactose intolerance, unspecified: Secondary | ICD-10-CM | POA: Diagnosis not present

## 2024-04-06 LAB — BASIC METABOLIC PANEL WITH GFR
BUN: 19 mg/dL (ref 6–23)
CO2: 31 meq/L (ref 19–32)
Calcium: 9.4 mg/dL (ref 8.4–10.5)
Chloride: 99 meq/L (ref 96–112)
Creatinine, Ser: 0.85 mg/dL (ref 0.40–1.20)
GFR: 63.41 mL/min (ref >60.00)
Glucose, Bld: 88 mg/dL (ref 70–99)
Potassium: 4 meq/L (ref 3.5–5.1)
Sodium: 137 meq/L (ref 135–145)

## 2024-04-06 LAB — CBC
HCT: 39.5 % (ref 36.0–46.0)
Hemoglobin: 13 g/dL (ref 12.0–15.0)
MCHC: 32.8 g/dL (ref 30.0–36.0)
MCV: 87.9 fl (ref 78.0–100.0)
Platelets: 285 K/uL (ref 150.0–400.0)
RBC: 4.5 Mil/uL (ref 3.87–5.11)
RDW: 13.2 % (ref 11.5–15.5)
WBC: 6.3 K/uL (ref 4.0–10.5)

## 2024-04-06 LAB — C-REACTIVE PROTEIN: CRP: <1 mg/dL (ref 0.5–20.0)

## 2024-04-06 MED ORDER — OMEPRAZOLE 40 MG PO CPDR: 40.0000 mg | DELAYED_RELEASE_CAPSULE | Freq: Every day | ORAL | 0 refills | Status: DC

## 2024-04-06 MED ORDER — FLUTICASONE PROPIONATE 50 MCG/ACT NA SUSP: 2.0000 | Freq: Every day | NASAL | 3 refills | Status: AC

## 2024-04-06 MED ORDER — CETIRIZINE HCL 10 MG PO TABS: 10.0000 mg | ORAL_TABLET | Freq: Every day | ORAL | 3 refills | Status: DC | PRN

## 2024-04-06 MED ORDER — ALBUTEROL SULFATE HFA 108 (90 BASE) MCG/ACT IN AERS: 2.0000 | INHALATION_SPRAY | RESPIRATORY_TRACT | 1 refills | Status: AC | PRN

## 2024-04-06 NOTE — Progress Notes (Unsigned)
 ACUTE VISIT Chief Complaint  Patient presents with   Dysphagia   HPI: Crystal Levy is a 83 y.o. female with PMHx significant for HTN, hypothyroidism, HLD, insomnia, depression, anxiety, PAD, OA, and hx of polio here today complaining of dysphagia with liquids and solids.  This has been ongoing 6-8 months but worsening more recently.  *** Associated symptoms included heartburn and occasional sharp epigastric pain.  Reports discomfort in anterior lower neck , exacerbated by wearing regular t-shirts that touch area. Hx of enlarged thyroid , concerned about growth.  No changes to bowel habits, nausea,vomiting, or urinary symptoms. She has not tried OTC medications.   She takes  Miralax  every 2-3 days and milk of magnesium to avoid constipation since having rectopexy on 01/29/2023.  No blood in stools or black or tarry stools.   Pt had EGD at [facility] on 05/13/2019 due to dysphagia and epigastric abdominal pain. Results showed duodenal bulb ulceration, small hiatal hernia compatible with gastritis, and her esophagus appeared normal.  -Also concerned about mildly elevated temp. Pt has been monitoring her temperature at home over the last few months.  She states her baseline temperature is 97.6 She reports recent temperature have been higher than her baseline at 99 and highest reading of 110.4 degrees. Occasionally associated with sneezing and rhinorrhea. Allergic rhinitis, follows with immunologist.  She also mentions SOB with certain activities like making her bed. No associated CP or palpitations. She has seen cardiologist last in 07/2022. Echo 08/2022 LVEF 60-65%.  Lab Results  Component Value Date   WBC 6.0 06/05/2023   HGB 13.2 06/05/2023   HCT 40.7 06/05/2023   MCV 89.2 06/05/2023   PLT 277.0 06/05/2023   Lab Results  Component Value Date   CRP <1.0 06/05/2023   Hypothyroidism: She is on Levothyroxine  88 mcg daily Tolerating medication well, no side effects  reported.  Lab Results  Component Value Date   TSH 0.95 06/05/2023   Hypertension: She is on Benazepril  10 mg am and 20 mg pm. BP readings at home:120-130's70's. Negative for unusual or severe headache, visual changes, focal weakness, or worsening edema.  Lab Results  Component Value Date   CREATININE 0.58 01/21/2024   BUN 10 01/21/2024   NA 137 01/21/2024   K 4.3 01/21/2024   CL 101 01/21/2024   CO2 29 01/21/2024   Review of Systems  Constitutional:  Negative for activity change, appetite change and chills.  HENT:  Negative for facial swelling, sore throat and voice change.   Respiratory:  Negative for cough and wheezing.   Cardiovascular:  Negative for leg swelling.  Endocrine: Negative for cold intolerance and heat intolerance.  Genitourinary:  Negative for decreased urine volume, dysuria and hematuria.  Musculoskeletal:  Positive for arthralgias and back pain.  Skin:  Negative for rash.  Allergic/Immunologic: Positive for environmental allergies.  Neurological:  Negative for syncope and facial asymmetry.  Psychiatric/Behavioral:  Negative for confusion. The patient is nervous/anxious.   See other pertinent positives and negatives in HPI.  Current Outpatient Medications on File Prior to Visit  Medication Sig Dispense Refill   azelastine  (ASTELIN ) 0.1 % nasal spray Place 2 sprays into both nostrils 2 (two) times daily as needed for allergies. Use in each nostril as directed 30 mL 5   benazepril  (LOTENSIN ) 20 MG tablet TAKE ONE-HALF TABLET BY MOUTH IN THE MORNING AND 1 TABLET BY  MOUTH IN THE EVENING 150 tablet 2   Cholecalciferol (VITAMIN D-3) 1000 UNITS CAPS Take 1,000 Units by  mouth daily.     citalopram  (CELEXA ) 40 MG tablet Take 1 tablet (40 mg total) by mouth daily. 90 tablet 2   Iodine, Kelp, (KELP PO) Take 1 tablet by mouth daily.     levothyroxine  (SYNTHROID ) 88 MCG tablet Take 1 tablet (88 mcg total) by mouth daily before breakfast. 100 tablet 2   LORazepam   (ATIVAN ) 1 MG tablet TAKE 1 TABLET BY MOUTH AT BEDTIME AS NEEDED FOR ANXIETY 30 tablet 3   magnesium hydroxide (MILK OF MAGNESIA) 400 MG/5ML suspension Take 5 mLs by mouth daily as needed for mild constipation.     polyethylene glycol (MIRALAX  / GLYCOLAX ) 17 g packet Take 17 g by mouth daily.     rosuvastatin  (CRESTOR ) 5 MG tablet TAKE 1 TABLET BY MOUTH DAILY 100 tablet 2   Selenium (SELENIMIN PO) Take 1 tablet by mouth daily.     albuterol  (VENTOLIN  HFA) 108 (90 Base) MCG/ACT inhaler Inhale 2 puffs into the lungs every 4 (four) hours as needed for wheezing or shortness of breath. 18 g 1   cetirizine  (ZYRTEC  ALLERGY ) 10 MG tablet Take 1 tablet (10 mg total) by mouth daily as needed for allergies (Can take an extra dose during flare ups.). 180 tablet 3   fluticasone  (FLONASE ) 50 MCG/ACT nasal spray Place 2 sprays into both nostrils daily. 48 g 3   No current facility-administered medications on file prior to visit.    Past Medical History:  Diagnosis Date   Anxiety    Arthritis    Bicuspid aortic valve    Depression    Heart murmur    Hypertension    Hypothyroidism    PAD (peripheral artery disease) (HCC)    Palpitations    Pneumonia    Polio    Thyroid  disease    Allergies  Allergen Reactions   Clindamycin/Lincomycin Diarrhea and Other (See Comments)    C-Diff   Fluticasone      Headache and nosebleed    Azithromycin     Unknown   Egg-Derived Products     Per allergy  testing   Milk-Related Compounds     Per allergy  testing   Penicillins Rash   Sulfa Antibiotics Diarrhea   Fenofibrate Rash    Social History   Socioeconomic History   Marital status: Divorced    Spouse name: Not on file   Number of children: 2   Years of education: Not on file   Highest education level: Master's degree (e.g., MA, MS, MEng, MEd, MSW, MBA)  Occupational History    Employer: [employer]  Tobacco Use   Smoking status: Never   Smokeless tobacco: Never   Tobacco comments:     Rare smoker in the distant past.   Vaping Use   Vaping status: Never Used  Substance and Sexual Activity   Alcohol use: Yes    Alcohol/week: 1.0 standard drink of alcohol    Types: 1 Glasses of wine per week    Comment: wine 3 times per week   Drug use: No   Sexual activity: Not on file  Other Topics Concern   Not on file  Social History Narrative   Lives alone. 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   Social Drivers of Health   Financial Resource Strain: Low Risk  (04/05/2024)   Overall Financial Resource Strain (CARDIA)    Difficulty of Paying Living Expenses: Not very hard  Food Insecurity: No Food Insecurity (04/05/2024)   Hunger Vital Sign    Worried About Running  Out of Food in the Last Year: Never true    Ran Out of Food in the Last Year: Never true  Transportation Needs: No Transportation Needs (04/05/2024)   PRAPARE - Administrator, Civil Service (Medical): No    Lack of Transportation (Non-Medical): No  Physical Activity: Sufficiently Active (04/05/2024)   Exercise Vital Sign    Days of Exercise per Week: 7 days    Minutes of Exercise per Session: 30 min  Stress: No Stress Concern Present (04/05/2024)   [employer] of Occupational Health - Occupational Stress Questionnaire    Feeling of Stress: Only a little  Social Connections: Moderately Isolated (04/05/2024)   Social Connection and Isolation Panel    Frequency of Communication with Friends and Family: More than three times a week    Frequency of Social Gatherings with Friends and Family: Once a week    Attends Religious Services: Never    Database administrator or Organizations: Yes    Attends Engineer, structural: More than 4 times per year    Marital Status: Divorced    Vitals:   04/06/24 1320  BP: 126/70  Pulse: 76  Resp: 16  SpO2: 98%   Body mass index is 24.63 kg/m.  Physical Exam Vitals and nursing note reviewed.  Constitutional:      General: She is not in  acute distress.    Appearance: She is well-developed.  HENT:     Head: Normocephalic and atraumatic.     Mouth/Throat:     Mouth: Mucous membranes are moist.     Pharynx: Oropharynx is clear. Uvula midline. No posterior oropharyngeal erythema.  Eyes:     Conjunctiva/sclera: Conjunctivae normal.  Neck:     Thyroid : Thyromegaly present. No thyroid  mass.  Cardiovascular:     Rate and Rhythm: Normal rate and regular rhythm.     Heart sounds: No murmur heard. Pulmonary:     Effort: Pulmonary effort is normal. No respiratory distress.     Breath sounds: Normal breath sounds.  Musculoskeletal:     Right lower leg: No edema.     Left lower leg: No edema.  Lymphadenopathy:     Cervical: No cervical adenopathy.  Skin:    General: Skin is warm.     Findings: No erythema or rash.  Neurological:     Mental Status: She is alert and oriented to person, place, and time.     Comments: Mildly unstable gait, not assisted today. RLE weakness, wearing a brace, polio sequela. Rest with no significant deficit.    Psychiatric:        Mood and Affect: Mood and affect normal.   ASSESSMENT AND PLAN:  Ms. Vincy Feliz was seen today for dysphagia.  Orders Placed This Encounter  Procedures   CBC   C-reactive protein   TSH   Basic metabolic panel with GFR   Ambulatory referral to Gastroenterology   *** Dysphagia, unspecified type For solids a liquids, gradually getting worse. Has had this problem in the past. PPI started today. GI referral placed.  -     Ambulatory referral to Gastroenterology  Gastroesophageal reflux disease, unspecified whether esophagitis present Assessment & Plan: Problem is not well-controlled. Recommend omeprazole  40 mg 30 minutes before lunch. Continue GERD precautions. GI referral placed.  Orders: -     Ambulatory referral to Gastroenterology -     Omeprazole ; Take 1 capsule (40 mg total) by mouth daily before lunch.  Dispense: 90 capsule; Refill:  0  Elevated temperature Reporting some temperatures at home 98-45F for the past few months with no associated symptoms except for sneezing and rhinorrhea sometimes.  Not sure about the cause of these elevated temp, not fever. Could be related by ambient temp Recommend monitoring for changes or new symptoms.  -     CBC; Future -     C-reactive protein; Future -     TSH; Future  Hypothyroidism (acquired) Assessment & Plan: Problem has been adequately controlled. Mildly enlarged thyroid  on examination. Last thyroid  US  10/2021: Markedly heterogeneous and mildly enlarged thyroid  gland. No discrete thyroid  nodules of consequence that would warrant further evaluation are identified.  Last TSH in 05/2023 was 0.9. Continue levothyroxine  88 mcg daily. Further recommendation will be given according to TSH result.  Orders: -     TSH; Future  Primary hypertension Assessment & Plan: BP adequately controlled, home BPs 120s-130s/70s. Continue benazepril  20 mg 1/2 tablet in the morning and 1 tablet at night as well as low-salt diet. Continue monitoring BP regularly.  Orders: -     Basic metabolic panel with GFR; Future  I spent a total of 43 minutes in both face to face and non face to face activities for this visit on the date of this encounter. During this time history was obtained and documented, examination was performed, prior labs/imaging reviewed, and assessment/plan discussed.  Return if symptoms worsen or fail to improve, for keep next appointment.   I, Vernell Forest, acting as a scribe for Jemila Camille [place], MD., have documented all relevant documentation on the behalf of Crystal Arnold [place], MD, as directed by   while in the presence of Crystal Arriaga [place], MD.  I, Ledell Codrington [place], MD, have reviewed all documentation for this visit. The documentation on 04/06/24 for the exam, diagnosis, procedures, and orders are all accurate and complete.  Jontay Maston G. [place], MD  [facility]. [place]  office.

## 2024-04-06 NOTE — Patient Instructions (Addendum)
 A few things to remember from today's visit:  Gastroesophageal reflux disease, unspecified whether esophagitis present - Plan: Ambulatory referral to Gastroenterology  Dysphagia, unspecified type - Plan: Ambulatory referral to Gastroenterology  Elevated temperature - Plan: CBC, C-reactive protein, TSH  Hypothyroidism (acquired) - Plan: TSH  Primary hypertension - Plan: Basic metabolic panel with GFR  If you need refills for medications you take chronically, please call your pharmacy. Do not use My Chart to request refills or for acute issues that need immediate attention. If you send a my chart message, it may take a few days to be addressed, specially if I am not in the office.  Please be sure medication list is accurate. If a new problem present, please set up appointment sooner than planned today.

## 2024-04-06 NOTE — Assessment & Plan Note (Signed)
 BP adequately controlled, home BPs 120s-130s/70s. Continue benazepril  20 mg 1/2 tablet in the morning and 1 tablet at night as well as low-salt diet. Continue monitoring BP regularly.

## 2024-04-06 NOTE — Progress Notes (Unsigned)
 [place] - [place] - [place] - [place] - [place]   Follow-up Note  Referring Provider: [place], Betty G, MD Primary Provider: [place], Betty G, MD Date of Office Visit: 04/06/2024  Subjective:   Crystal Levy (DOB: [birth year]) is a 83 y.o. female who [facility] in re-evaluation of the following:  HPI: Crystal Levy returns to this clinic in evaluation of asthma and allergic rhinitis.  I have never seen her in this clinic and her last visit with Dr. Tobie was 23 December 2023 for her initial evaluation.  Crystal Levy is doing much better at this point in time while attempting to perform allergen avoidance measures and consistently using a nasal steroid along with an antihistamine.  Very little complaints involving her upper airway and she can breathe through her nose with no difficulty and smell and taste with no difficulty.  She has a diagnosis of asthma that is very intermittent and she has a rare requirement for short acting bronchodilator.  She can exercise to the extent that her musculoskeletal issues allow her to do so with no problem.  She has a distant history of having allergy  to egg and dairy.  She is now eating boiled eggs with no problem.  She does avoid cows milk in large quantities although she can eat cheese on occasion and she can eat ice cream on occasion without developing an allergic reaction.  It sounds as though when she consumes cows milk she gets GI distress.  Her son has lactose intolerance and uses Lactaid.  Allergies as of 04/06/2024       Reactions   Clindamycin/lincomycin Diarrhea, Other (See Comments)   C-Diff   Fluticasone     Headache and nosebleed   Azithromycin    Unknown   Egg-derived Products    Per allergy  testing   Milk-related Compounds    Per allergy  testing   Penicillins Rash   Sulfa Antibiotics Diarrhea   Fenofibrate Rash        Medication List    albuterol  108 (90 Base) MCG/ACT  inhaler Commonly known as: VENTOLIN  HFA INHALE 2 PUFFS BY MOUTH EVERY 6 HOURS AS NEEDED FOR SHORTNESS OF BREATH FOR WHEEZING   azelastine  0.1 % nasal spray Commonly known as: ASTELIN  Place 2 sprays into both nostrils 2 (two) times daily as needed for allergies. Use in each nostril as directed   benazepril  20 MG tablet Commonly known as: LOTENSIN  TAKE ONE-HALF TABLET BY MOUTH IN THE MORNING AND 1 TABLET BY  MOUTH IN THE EVENING   cetirizine  10 MG tablet Commonly known as: ZyrTEC  Allergy  Take 1 tablet (10 mg total) by mouth daily.   citalopram  40 MG tablet Commonly known as: CELEXA  Take 1 tablet (40 mg total) by mouth daily.   fluticasone  50 MCG/ACT nasal spray Commonly known as: FLONASE  Place 2 sprays into both nostrils daily.   KELP PO Take 1 tablet by mouth daily.   levothyroxine  88 MCG tablet Commonly known as: SYNTHROID  Take 1 tablet (88 mcg total) by mouth daily before breakfast.   LORazepam  1 MG tablet Commonly known as: ATIVAN  TAKE 1 TABLET BY MOUTH AT BEDTIME AS NEEDED FOR ANXIETY   magnesium hydroxide 400 MG/5ML suspension Commonly known as: MILK OF MAGNESIA Take 5 mLs by mouth daily as needed for mild constipation.   polyethylene glycol 17 g packet Commonly known as: MIRALAX  / GLYCOLAX  Take 17 g by mouth daily.   rosuvastatin  5 MG tablet Commonly known as: CRESTOR  TAKE  1 TABLET BY MOUTH DAILY   SELENIMIN PO Take 1 tablet by mouth daily.   Vitamin D-3 25 MCG (1000 UT) Caps Take 1,000 Units by mouth daily.    Past Medical History:  Diagnosis Date   Anxiety    Arthritis    Bicuspid aortic valve    Depression    Heart murmur    Hypertension    Hypothyroidism    PAD (peripheral artery disease) (HCC)    Palpitations    Pneumonia    Polio    Thyroid  disease     Past Surgical History:  Procedure Laterality Date   ABDOMINAL HYSTERECTOMY     BREAST ENHANCEMENT SURGERY     BREAST SURGERY     BUNIONECTOMY Left    CARDIAC CATHETERIZATION      EYE SURGERY     HAMMER TOE SURGERY Right    HERNIA REPAIR     JOINT REPLACEMENT     left hip   KNEE SURGERY     meniscus   RADICAL VAGINAL HYSTERECTOMY  09/10/1991   TONSILLECTOMY     TOTAL KNEE ARTHROPLASTY Left 12/20/2014   Procedure: LEFT TOTAL KNEE ARTHROPLASTY;  Surgeon: Maude Herald, MD;  Location: [facility] OR;  Service: Orthopedics;  Laterality: Left;   XI ROBOT ASSISTED RECTOPEXY N/A 01/29/2023   Procedure: XI ROBOT ASSISTED RECTOPEXY;  Surgeon: Debby Hila, MD;  Location: [facility] ORS;  Service: General;  Laterality: N/A;    Review of systems negative except as noted in HPI / PMHx or noted below:  Review of Systems  Constitutional: Negative.   HENT: Negative.    Eyes: Negative.   Respiratory: Negative.    Cardiovascular: Negative.   Gastrointestinal: Negative.   Genitourinary: Negative.   Musculoskeletal: Negative.   Skin: Negative.   Neurological: Negative.   Endo/Heme/Allergies: Negative.   Psychiatric/Behavioral: Negative.       Objective:   Vitals:   04/06/24 1113  BP: 122/62  Pulse: 64  Resp: 16  Temp: 98.3 F (36.8 C)  SpO2: 99%   Height: 5' 4.57 (164 cm)  Weight: 142 lb 14.4 oz (64.8 kg)   Physical Exam Constitutional:      Appearance: She is not diaphoretic.  HENT:     Head: Normocephalic.     Right Ear: Tympanic membrane, ear canal and external ear normal.     Left Ear: Tympanic membrane, ear canal and external ear normal.     Nose: Nose normal. No mucosal edema or rhinorrhea.     Mouth/Throat:     Pharynx: Uvula midline. No oropharyngeal exudate.  Eyes:     Conjunctiva/sclera: Conjunctivae normal.  Neck:     Thyroid : No thyromegaly.     Trachea: Trachea normal. No tracheal tenderness or tracheal deviation.  Cardiovascular:     Rate and Rhythm: Normal rate and regular rhythm.     Heart sounds: Normal heart sounds, S1 normal and S2 normal. No murmur heard. Pulmonary:     Effort: No respiratory distress.     Breath sounds: Normal breath  sounds. No stridor. No wheezing or rales.  Lymphadenopathy:     Head:     Right side of head: No tonsillar adenopathy.     Left side of head: No tonsillar adenopathy.     Cervical: No cervical adenopathy.  Skin:    Findings: No erythema or rash.     Nails: There is no clubbing.  Neurological:     Mental Status: She is alert.     Diagnostics: Spirometry  was performed and demonstrated an FEV1 of 2.14 at 112 % of predicted.   Assessment and Plan:   1. Seasonal allergic rhinitis due to pollen   2. Asthma, mild intermittent, well-controlled   3. Lactose intolerance    1. Allergen avoidance measures - trees, grasses  2. Treat and prevent inflammation of airway:   A. Flonase  - 2 sprays each nostril 3-7 times per week  3. If needed:   A. Zyrtec  10 mg - 1 tablet 1 time per day  B. Albuterol  - 2 inhalations every 4-6 hours  4. Influenza = Tamiflu. Covid = Paxlovid  5. Return to clinic in 1 year or earlier if problem  6. Lactaid with dairy meal  Crystal Levy appears to be doing very well with some allergen avoidance measures and the use of some nasal steroids and some other medications that she uses as needed as specified above.  Assuming she continues to do well with this plan we will see her back in this clinic in 1 year or earlier if there is a problem.  I did mention to her that she probably has lactose intolerance and if she so desires to eat a lactose containing meal she can always try Lactaid to alleviate her GI symptoms associated with consumption of lactose.  Crystal Denis, MD Allergy  / Immunology [facility]

## 2024-04-06 NOTE — Assessment & Plan Note (Signed)
 Problem has been adequately controlled. Last TSH in 05/2023 was 0.9. Continue levothyroxine  88 mcg daily. Further recommendation will be given according to TSH result.

## 2024-04-06 NOTE — Assessment & Plan Note (Signed)
 Problem is not well-controlled. Recommend omeprazole  40 mg 30 minutes before lunch. Continue GERD precautions. GI referral placed.

## 2024-04-06 NOTE — Patient Instructions (Addendum)
  1. Allergen avoidance measures - trees, grasses  2. Treat and prevent inflammation of airway:   A. Flonase  - 2 sprays each nostril 3-7 times per week  3. If needed:   A. Zyrtec  10 mg - 1 tablet 1 time per day  B. Albuterol  - 2 inhalations every 4-6 hours  4. Influenza = Tamiflu. Covid = Paxlovid  5. Return to clinic in 1 year or earlier if problem  6. Lactaid with dairy meal

## 2024-04-07 ENCOUNTER — Encounter: Payer: Self-pay | Admitting: Allergy and Immunology

## 2024-04-07 ENCOUNTER — Ambulatory Visit: Payer: Self-pay | Admitting: Family Medicine

## 2024-04-07 LAB — TSH: TSH: 0.76 u[IU]/mL (ref 0.35–5.50)

## 2024-04-26 ENCOUNTER — Ambulatory Visit (INDEPENDENT_AMBULATORY_CARE_PROVIDER_SITE_OTHER)

## 2024-04-26 VITALS — Ht 64.0 in | Wt 135.3 lb

## 2024-04-26 DIAGNOSIS — Z0000 Encounter for general adult medical examination without abnormal findings: Secondary | ICD-10-CM

## 2024-04-26 NOTE — Patient Instructions (Addendum)
 Crystal Levy , Thank you for taking time out of your busy schedule to complete your Annual Wellness Visit with me. I enjoyed our conversation and look forward to speaking with you again next year. I, as well as your care team,  appreciate your ongoing commitment to your health goals. Please review the following plan we discussed and let me know if I can assist you in the future. Your Game plan/ To Do List    Referrals: If you haven't heard from the office you've been referred to, please reach out to them at the phone provided.   Follow up Visits: We will see or speak with you next year for your Next Medicare AWV with our clinical staff 05/02/25 @ 1:40p Have you seen your provider in the last 6 months (3 months if uncontrolled diabetes)?   Clinician Recommendations:  Aim for 30 minutes of exercise or brisk walking, 6-8 glasses of water, and 5 servings of fruits and vegetables each day. Next appointment with provider 07/23/24 @ 10a      This is a list of the screenings recommended for you:  Health Maintenance  Topic Date Due   COVID-19 Vaccine (7 - 2024-25 season) 05/11/2023   Flu Shot  04/09/2024   Medicare Annual Wellness Visit  04/26/2025   Pneumococcal Vaccine for age over 22  Completed   DEXA scan (bone density measurement)  Completed   Zoster (Shingles) Vaccine  Completed   HPV Vaccine  Aged Out   Meningitis B Vaccine  Aged Out   DTaP/Tdap/Td vaccine  Discontinued   Pneumococcal Vaccine  Discontinued   Hepatitis C Screening  Discontinued    Advanced directives: (In Chart) A copy of your advanced directives are scanned into your chart should your provider ever need it. Advance Care Planning is important because it:  [x]  Makes sure you receive the medical care that is consistent with your values, goals, and preferences  [x]  It provides guidance to your family and loved ones and reduces their decisional burden about whether or not they are making the right decisions based on your  wishes.  Follow the link provided in your after visit summary or read over the paperwork we have mailed to you to help you started getting your Advance Directives in place. If you need assistance in completing these, please reach out to us  so that we can help you!  See attachments for Preventive Care and Fall Prevention Tips.

## 2024-04-26 NOTE — Progress Notes (Signed)
 Subjective:   Crystal Levy is a 84 y.o. who presents for a Medicare Wellness preventive visit.  As a reminder, Annual Wellness Visits don't include a physical exam, and some assessments may be limited, especially if this visit is performed virtually. We may recommend an in-person follow-up visit with your provider if needed.  Visit Complete: Virtual I connected with  Crystal Levy on 04/26/24 by a audio enabled telemedicine application and verified that I am speaking with the correct person using two identifiers.  Patient Location: Home  Provider Location: Home Office  I discussed the limitations of evaluation and management by telemedicine. The patient expressed understanding and agreed to proceed.  Vital Signs: Because this visit was a virtual/telehealth visit, some criteria may be missing or patient reported. Any vitals not documented were not able to be obtained and vitals that have been documented are patient reported.    Persons Participating in Visit: Patient.  AWV Questionnaire: Yes: Patient Medicare AWV questionnaire was completed by the patient on 04/25/24; I have confirmed that all information answered by patient is correct and no changes since this date.  Cardiac Risk Factors include: advanced age (>30men, >26 women);hypertension     Objective:    Today's Vitals   04/26/24 1344 04/26/24 1346  Weight: 135 lb 4.8 oz (61.4 kg)   Height: 5' 4 (1.626 m)   PainSc:  0-No pain   Body mass index is 23.22 kg/m.     04/26/2024    1:58 PM 04/23/2023    2:51 PM 01/29/2023   10:07 AM 01/14/2023    2:21 PM 02/15/2022   10:55 AM 02/14/2021   11:27 AM 12/21/2014    2:39 PM  Advanced Directives  Does Patient Have a Medical Advance Directive? Yes Yes Yes Yes Yes Yes Yes   Type of Estate agent of [place];Living will Healthcare Power of [place];Living will Healthcare Power of [place];Living will Living will;Healthcare Power of [employer] Power  of [place];Living will Healthcare Power of [place];Living will Healthcare Power of [place];Living will   Does patient want to make changes to medical advance directive? No - Patient declined No - Patient declined No - Guardian declined  No - Patient declined  No - Patient declined   Copy of Healthcare Power of Attorney in Chart? Yes - validated most recent copy scanned in chart (See row information) Yes - validated most recent copy scanned in chart (See row information) No - copy requested  Yes - validated most recent copy scanned in chart (See row information) No - copy requested No - copy requested      Data saved with a previous flowsheet row definition    Current Medications (verified) Outpatient Encounter Medications as of 04/26/2024  Medication Sig   albuterol  (VENTOLIN  HFA) 108 (90 Base) MCG/ACT inhaler Inhale 2 puffs into the lungs every 4 (four) hours as needed for wheezing or shortness of breath.   azelastine  (ASTELIN ) 0.1 % nasal spray Place 2 sprays into both nostrils 2 (two) times daily as needed for allergies. Use in each nostril as directed   benazepril  (LOTENSIN ) 20 MG tablet TAKE ONE-HALF TABLET BY MOUTH IN THE MORNING AND 1 TABLET BY  MOUTH IN THE EVENING   cetirizine  (ZYRTEC  ALLERGY ) 10 MG tablet Take 1 tablet (10 mg total) by mouth daily as needed for allergies (Can take an extra dose during flare ups.).   Cholecalciferol (VITAMIN D-3) 1000 UNITS CAPS Take 1,000 Units by mouth daily.   citalopram  (CELEXA ) 40  MG tablet Take 1 tablet (40 mg total) by mouth daily.   fluticasone  (FLONASE ) 50 MCG/ACT nasal spray Place 2 sprays into both nostrils daily.   Iodine, Kelp, (KELP PO) Take 1 tablet by mouth daily.   levothyroxine  (SYNTHROID ) 88 MCG tablet Take 1 tablet (88 mcg total) by mouth daily before breakfast.   LORazepam  (ATIVAN ) 1 MG tablet TAKE 1 TABLET BY MOUTH AT BEDTIME AS NEEDED FOR ANXIETY   magnesium hydroxide (MILK OF MAGNESIA) 400 MG/5ML suspension Take 5 mLs by mouth  daily as needed for mild constipation.   omeprazole  (PRILOSEC) 40 MG capsule Take 1 capsule (40 mg total) by mouth daily before lunch.   polyethylene glycol (MIRALAX  / GLYCOLAX ) 17 g packet Take 17 g by mouth daily.   rosuvastatin  (CRESTOR ) 5 MG tablet TAKE 1 TABLET BY MOUTH DAILY   Selenium (SELENIMIN PO) Take 1 tablet by mouth daily.   No facility-administered encounter medications on file as of 04/26/2024.    Allergies (verified) Clindamycin/lincomycin, Fluticasone , Azithromycin, Egg-derived products, Milk-related compounds, Penicillins, Sulfa antibiotics, and Fenofibrate   History: Past Medical History:  Diagnosis Date   Anxiety    Arthritis    Bicuspid aortic valve    Depression    Heart murmur    Hypertension    Hypothyroidism    PAD (peripheral artery disease) (HCC)    Palpitations    Pneumonia    Polio    Thyroid  disease    Past Surgical History:  Procedure Laterality Date   ABDOMINAL HYSTERECTOMY     BREAST ENHANCEMENT SURGERY     BREAST SURGERY     BUNIONECTOMY Left    CARDIAC CATHETERIZATION     EYE SURGERY     HAMMER TOE SURGERY Right    HERNIA REPAIR     JOINT REPLACEMENT     left hip   KNEE SURGERY     meniscus   RADICAL VAGINAL HYSTERECTOMY  09/10/1991   TONSILLECTOMY     TOTAL KNEE ARTHROPLASTY Left 12/20/2014   Procedure: LEFT TOTAL KNEE ARTHROPLASTY;  Surgeon: Maude Herald, MD;  Location: [facility] OR;  Service: Orthopedics;  Laterality: Left;   XI ROBOT ASSISTED RECTOPEXY N/A 01/29/2023   Procedure: XI ROBOT ASSISTED RECTOPEXY;  Surgeon: Debby Hila, MD;  Location: [facility];  Service: General;  Laterality: N/A;   Family History  Problem Relation Age of Onset   Migraines Mother    CAD Mother 40   Aortic stenosis Mother    Social History   Socioeconomic History   Marital status: Divorced    Spouse name: Not on file   Number of children: 2   Years of education: Not on file   Highest education level: Master's degree (e.g., MA, MS, MEng, MEd, MSW,  MBA)  Occupational History    Employer: [employer]  Tobacco Use   Smoking status: Never   Smokeless tobacco: Never   Tobacco comments:    Rare smoker in the distant past.   Vaping Use   Vaping status: Never Used  Substance and Sexual Activity   Alcohol use: Yes    Alcohol/week: 1.0 standard drink of alcohol    Types: 1 Glasses of wine per week    Comment: wine 3 times per week   Drug use: No   Sexual activity: Not on file  Other Topics Concern   Not on file  Social History Narrative   Lives alone. 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   Social Drivers of Corporate investment banker  Strain: Low Risk  (04/26/2024)   Overall Financial Resource Strain (CARDIA)    Difficulty of Paying Living Expenses: Not hard at all  Food Insecurity: No Food Insecurity (04/26/2024)   Hunger Vital Sign    Worried About Running Out of Food in the Last Year: Never true    Ran Out of Food in the Last Year: Never true  Transportation Needs: No Transportation Needs (04/26/2024)   PRAPARE - Administrator, Civil Service (Medical): No    Lack of Transportation (Non-Medical): No  Physical Activity: Sufficiently Active (04/26/2024)   Exercise Vital Sign    Days of Exercise per Week: 7 days    Minutes of Exercise per Session: 40 min  Stress: No Stress Concern Present (04/26/2024)   [employer] of Occupational Health - Occupational Stress Questionnaire    Feeling of Stress: Not at all  Social Connections: Moderately Integrated (04/26/2024)   Social Connection and Isolation Panel    Frequency of Communication with Friends and Family: More than three times a week    Frequency of Social Gatherings with Friends and Family: More than three times a week    Attends Religious Services: More than 4 times per year    Active Member of [employer] or Organizations: Yes    Attends Engineer, structural: More than 4 times per year    Marital Status: Divorced  Recent Concern:  Social Connections - Moderately Isolated (04/05/2024)   Social Connection and Isolation Panel    Frequency of Communication with Friends and Family: More than three times a week    Frequency of Social Gatherings with Friends and Family: Once a week    Attends Religious Services: Never    Database administrator or Organizations: Yes    Attends Engineer, structural: More than 4 times per year    Marital Status: Divorced    Tobacco Counseling Counseling given: Not Answered Tobacco comments: Rare smoker in the distant past.     Clinical Intake:  Pre-visit preparation completed: Yes  Pain : No/denies pain Pain Score: 0-No pain     BMI - recorded: 23.22 Nutritional Status: BMI of 19-24  Normal Nutritional Risks: None Diabetes: No  Lab Results  Component Value Date   HGBA1C 5.9 06/04/2023     How often do you need to have someone help you when you read instructions, pamphlets, or other written materials from your doctor or pharmacy?: 1 - Never  Interpreter Needed?: No  Information entered by :: Crystal Blush LPN   Activities of Daily Living     04/26/2024    1:54 PM 04/25/2024    2:38 PM  In your present state of health, do you have any difficulty performing the following activities:  Hearing? 0 0  Vision? 0 0  Difficulty concentrating or making decisions? 0 0  Walking or climbing stairs? 1 1  Comment Uses Rt leg Brace and Cane   Dressing or bathing? 1 1  Comment Rt leg brace slows down process   Doing errands, shopping? 0 0  Preparing Food and eating ? N N  Using the Toilet? N N  In the past six months, have you accidently leaked urine? CINDERELLA CINDERELLA  Comment Wears Pads. Followed by PCP   Do you have problems with loss of bowel control? N N  Managing your Medications? N N  Managing your Finances? N N  Housekeeping or managing your Housekeeping? Y Y    Patient Care Team: [place], Betty  KANDICE, MD as PCP - General (Family Medicine) Liane Sharyne KANDICE, [facility] (Inactive)  as Pharmacist (Pharmacist) Skeet Juliene SAUNDERS, DO as Consulting Physician (Neurology)  I have updated your Care Teams any recent Medical Services you may have received from other providers in the past year.     Assessment:   This is a routine wellness examination for [place].  Hearing/Vision screen Hearing Screening - Comments:: Denies hearing difficulties   Vision Screening - Comments:: Wears rx glasses - up to date with routine eye exams with  [facility]   Goals Addressed               This Visit's Progress     Increase physical activity (pt-stated)        Lose weight.       Depression Screen     04/26/2024    1:53 PM 08/06/2023   10:34 AM 06/04/2023    9:40 AM 04/23/2023    2:25 PM 11/04/2022   11:59 AM 05/08/2022   11:56 AM 02/15/2022   10:49 AM  PHQ 2/9 Scores  PHQ - 2 Score 0 2 3 0 0 2 0  PHQ- 9 Score  3 8   5      Fall Risk     04/26/2024    1:56 PM 04/25/2024    2:38 PM 04/23/2023    2:50 PM 11/04/2022   11:59 AM 05/08/2022   11:56 AM  Fall Risk   Falls in the past year? 1 1 0 0 0  Number falls in past yr: 0 1 0 0 0  Injury with Fall? 1 1 0 0 0  Comment Head injury. No medical attention needed.      Risk for fall due to :   No Fall Risks Other (Comment) Other (Comment)  Follow up Falls evaluation completed  Falls prevention discussed Falls evaluation completed Falls evaluation completed      Data saved with a previous flowsheet row definition    MEDICARE RISK AT HOME:  Medicare Risk at Home Any stairs in or around the home?: No If so, are there any without handrails?: No Home free of loose throw rugs in walkways, pet beds, electrical cords, etc?: No Adequate lighting in your home to reduce risk of falls?: Yes Life alert?: No Use of a cane, walker or w/c?: Yes Grab bars in the bathroom?: Yes Shower chair or bench in shower?: No Elevated toilet seat or a handicapped toilet?: Yes  TIMED UP AND GO:  Was the test performed?  No  Cognitive  Function: 6CIT completed        04/26/2024    1:59 PM 04/23/2023    2:51 PM 02/15/2022   10:55 AM  6CIT Screen  What Year? 0 points 0 points 0 points  What month? 0 points 0 points 0 points  What time? 0 points 0 points 0 points  Count back from 20 0 points 0 points 0 points  Months in reverse 0 points 0 points 0 points  Repeat phrase 0 points 0 points 0 points  Total Score 0 points 0 points 0 points    Immunizations Immunization History  Administered Date(s) Administered   Fluad Quad(high Dose 65+) 05/28/2021, 07/18/2022   Fluad Trivalent(High Dose 65+) 06/09/2023   Fluzone Influenza virus vaccine,trivalent (IIV3), split virus 09/07/2018, 04/28/2019   Influenza, High Dose Seasonal PF 08/31/2018, 06/05/2020   PFIZER(Purple Top)SARS-COV-2 Vaccination 09/10/2019, 10/21/2019, 06/19/2020, 12/08/2020   Pfizer Covid-19 Vaccine Bivalent Booster 82yrs & up 06/22/2021, 07/01/2022  Pneumococcal Conjugate-13 01/27/2014   Pneumococcal Polysaccharide-23 09/10/2007   Pneumococcal-Unspecified 09/09/2008   Respiratory Syncytial Virus Vaccine,Recomb Aduvanted(Arexvy) 05/15/2022   Tdap 09/09/2009, 01/16/2010   Zoster Recombinant(Shingrix) 12/26/2016, 04/12/2017   Zoster, Live 09/09/2009, 03/15/2013    Screening Tests Health Maintenance  Topic Date Due   COVID-19 Vaccine (7 - 2024-25 season) 05/11/2023   INFLUENZA VACCINE  04/09/2024   Medicare Annual Wellness (AWV)  04/26/2025   Pneumococcal Vaccine: 50+ Years  Completed   DEXA SCAN  Completed   Zoster Vaccines- Shingrix  Completed   HPV VACCINES  Aged Out   Meningococcal B Vaccine  Aged Out   DTaP/Tdap/Td  Discontinued   Pneumococcal Vaccine  Discontinued   Hepatitis C Screening  Discontinued    Health Maintenance  Health Maintenance Due  Topic Date Due   COVID-19 Vaccine (7 - 2024-25 season) 05/11/2023   INFLUENZA VACCINE  04/09/2024   Health Maintenance Items Addressed:   Additional Screening:  Vision Screening:  Recommended annual ophthalmology exams for early detection of glaucoma and other disorders of the eye. Would you like a referral to an eye doctor? No    Dental Screening: Recommended annual dental exams for proper oral hygiene  Community Resource Referral / Chronic Care Management: CRR required this visit?  No   CCM required this visit?  No   Plan:    I have personally reviewed and noted the following in the patient's chart:   Medical and social history Use of alcohol, tobacco or illicit drugs  Current medications and supplements including opioid prescriptions. Patient is not currently taking opioid prescriptions. Functional ability and status Nutritional status Physical activity Advanced directives List of other physicians Hospitalizations, surgeries, and ER visits in previous 12 months Vitals Screenings to include cognitive, depression, and falls Referrals and appointments  In addition, I have reviewed and discussed with patient certain preventive protocols, quality metrics, and best practice recommendations. A written personalized care plan for preventive services as well as general preventive health recommendations were provided to patient.   MAYERLI KIRST, LPN   1/81/7974   After Visit Summary: (MyChart) Due to this being a telephonic visit, the after visit summary with patients personalized plan was offered to patient via MyChart   Notes: Nothing significant to report at this time.

## 2024-05-05 DIAGNOSIS — M25511 Pain in right shoulder: Secondary | ICD-10-CM | POA: Diagnosis not present

## 2024-05-05 DIAGNOSIS — M25562 Pain in left knee: Secondary | ICD-10-CM | POA: Diagnosis not present

## 2024-05-05 DIAGNOSIS — M25552 Pain in left hip: Secondary | ICD-10-CM | POA: Diagnosis not present

## 2024-05-06 DIAGNOSIS — R1310 Dysphagia, unspecified: Secondary | ICD-10-CM | POA: Diagnosis not present

## 2024-05-06 DIAGNOSIS — K219 Gastro-esophageal reflux disease without esophagitis: Secondary | ICD-10-CM | POA: Diagnosis not present

## 2024-05-06 DIAGNOSIS — R059 Cough, unspecified: Secondary | ICD-10-CM | POA: Diagnosis not present

## 2024-05-06 DIAGNOSIS — R1311 Dysphagia, oral phase: Secondary | ICD-10-CM | POA: Diagnosis not present

## 2024-05-16 ENCOUNTER — Other Ambulatory Visit: Payer: Self-pay | Admitting: Family Medicine

## 2024-05-16 DIAGNOSIS — F419 Anxiety disorder, unspecified: Secondary | ICD-10-CM

## 2024-05-18 ENCOUNTER — Telehealth: Payer: Self-pay | Admitting: *Deleted

## 2024-05-18 DIAGNOSIS — Z23 Encounter for immunization: Secondary | ICD-10-CM

## 2024-05-18 MED ORDER — COVID-19 MRNA VACC (MODERNA) 50 MCG/0.5ML IM SUSP: 0.5000 mL | Freq: Once | INTRAMUSCULAR | 0 refills | Status: AC

## 2024-05-18 NOTE — Telephone Encounter (Signed)
 Rx sent.

## 2024-05-18 NOTE — Telephone Encounter (Signed)
 Copied from CRM [id number]. Topic: Clinical - Medication Question >> May 18, 2024 12:35 PM Deaijah H wrote: Reason for CRM: Patient called in to have prescription for COVID written.

## 2024-05-19 ENCOUNTER — Telehealth: Payer: Self-pay | Admitting: *Deleted

## 2024-05-19 DIAGNOSIS — Z23 Encounter for immunization: Secondary | ICD-10-CM

## 2024-05-19 MED ORDER — COVID-19 MRNA VACC (MODERNA) 50 MCG/0.5ML IM SUSP: 0.5000 mL | Freq: Once | INTRAMUSCULAR | 0 refills | Status: AC

## 2024-05-19 NOTE — Telephone Encounter (Signed)
 Rx sent.

## 2024-05-19 NOTE — Telephone Encounter (Signed)
 Copied from CRM [id number]. Topic: Clinical - Prescription Issue >> May 19, 2024 12:49 PM Crystal Levy wrote: Reason for CRM: Patient called in stating Drug store advised her the prescription is wrong and was for last year's covid vaccine. So a new one would need to be rewritten with MISC. Covid Vaccine on script.

## 2024-05-20 DIAGNOSIS — Z9181 History of falling: Secondary | ICD-10-CM | POA: Diagnosis not present

## 2024-05-20 DIAGNOSIS — M6281 Muscle weakness (generalized): Secondary | ICD-10-CM | POA: Diagnosis not present

## 2024-05-20 DIAGNOSIS — R262 Difficulty in walking, not elsewhere classified: Secondary | ICD-10-CM | POA: Diagnosis not present

## 2024-05-25 DIAGNOSIS — R262 Difficulty in walking, not elsewhere classified: Secondary | ICD-10-CM | POA: Diagnosis not present

## 2024-05-25 DIAGNOSIS — M6281 Muscle weakness (generalized): Secondary | ICD-10-CM | POA: Diagnosis not present

## 2024-05-25 DIAGNOSIS — Z9181 History of falling: Secondary | ICD-10-CM | POA: Diagnosis not present

## 2024-05-27 DIAGNOSIS — M6281 Muscle weakness (generalized): Secondary | ICD-10-CM | POA: Diagnosis not present

## 2024-05-27 DIAGNOSIS — R262 Difficulty in walking, not elsewhere classified: Secondary | ICD-10-CM | POA: Diagnosis not present

## 2024-05-27 DIAGNOSIS — Z9181 History of falling: Secondary | ICD-10-CM | POA: Diagnosis not present

## 2024-06-07 DIAGNOSIS — M6281 Muscle weakness (generalized): Secondary | ICD-10-CM | POA: Diagnosis not present

## 2024-06-07 DIAGNOSIS — Z9181 History of falling: Secondary | ICD-10-CM | POA: Diagnosis not present

## 2024-06-07 DIAGNOSIS — R262 Difficulty in walking, not elsewhere classified: Secondary | ICD-10-CM | POA: Diagnosis not present

## 2024-06-09 DIAGNOSIS — R1311 Dysphagia, oral phase: Secondary | ICD-10-CM | POA: Diagnosis not present

## 2024-06-09 DIAGNOSIS — K219 Gastro-esophageal reflux disease without esophagitis: Secondary | ICD-10-CM | POA: Diagnosis not present

## 2024-06-09 DIAGNOSIS — Z9181 History of falling: Secondary | ICD-10-CM | POA: Diagnosis not present

## 2024-06-09 DIAGNOSIS — R262 Difficulty in walking, not elsewhere classified: Secondary | ICD-10-CM | POA: Diagnosis not present

## 2024-06-09 DIAGNOSIS — M6281 Muscle weakness (generalized): Secondary | ICD-10-CM | POA: Diagnosis not present

## 2024-06-09 DIAGNOSIS — R059 Cough, unspecified: Secondary | ICD-10-CM | POA: Diagnosis not present

## 2024-07-12 ENCOUNTER — Other Ambulatory Visit: Payer: Self-pay | Admitting: Internal Medicine

## 2024-07-23 ENCOUNTER — Encounter: Payer: Self-pay | Admitting: Family Medicine

## 2024-07-23 ENCOUNTER — Ambulatory Visit (INDEPENDENT_AMBULATORY_CARE_PROVIDER_SITE_OTHER): Admitting: Family Medicine

## 2024-07-23 VITALS — BP 136/66 | HR 63 | Temp 98.4°F | Resp 16 | Ht 64.0 in | Wt 137.0 lb

## 2024-07-23 DIAGNOSIS — E785 Hyperlipidemia, unspecified: Secondary | ICD-10-CM | POA: Diagnosis not present

## 2024-07-23 DIAGNOSIS — R7303 Prediabetes: Secondary | ICD-10-CM | POA: Diagnosis not present

## 2024-07-23 DIAGNOSIS — K219 Gastro-esophageal reflux disease without esophagitis: Secondary | ICD-10-CM | POA: Diagnosis not present

## 2024-07-23 DIAGNOSIS — F330 Major depressive disorder, recurrent, mild: Secondary | ICD-10-CM | POA: Diagnosis not present

## 2024-07-23 DIAGNOSIS — I10 Essential (primary) hypertension: Secondary | ICD-10-CM | POA: Diagnosis not present

## 2024-07-23 DIAGNOSIS — F419 Anxiety disorder, unspecified: Secondary | ICD-10-CM | POA: Diagnosis not present

## 2024-07-23 LAB — HEPATIC FUNCTION PANEL
ALT: 11 U/L (ref 0–35)
AST: 19 U/L (ref 0–37)
Albumin: 4.4 g/dL (ref 3.5–5.2)
Alkaline Phosphatase: 70 U/L (ref 39–117)
Bilirubin, Direct: 0.1 mg/dL (ref 0.0–0.3)
Total Bilirubin: 0.6 mg/dL (ref 0.2–1.2)
Total Protein: 6.8 g/dL (ref 6.0–8.3)

## 2024-07-23 LAB — LIPID PANEL
Cholesterol: 180 mg/dL (ref 0–200)
HDL: 55.7 mg/dL (ref >39.00)
LDL Cholesterol: 102 mg/dL — ABNORMAL HIGH (ref 0–99)
NonHDL: 124.55
Total CHOL/HDL Ratio: 3
Triglycerides: 112 mg/dL (ref 0.0–149.0)
VLDL: 22.4 mg/dL (ref 0.0–40.0)

## 2024-07-23 NOTE — Assessment & Plan Note (Signed)
 A healthy lifestyle encouraged for diabetes prevention. Last hemoglobin A1c was 5.9 in 05/2023. Further recommendation will be given according to hemoglobin A1c result.

## 2024-07-23 NOTE — Assessment & Plan Note (Signed)
 She started rosuvastatin  5 mg about 3 weeks ago, she would like lipid panel done today. Further recommendation will be given according to lipid panel result.

## 2024-07-23 NOTE — Assessment & Plan Note (Addendum)
 She saw GI after her last visit. Symptoms have resolved. She is no longer on omeprazole  40 mg, 2 medication for 2 days.

## 2024-07-23 NOTE — Patient Instructions (Addendum)
 A few things to remember from today's visit:  Primary hypertension  Gastroesophageal reflux disease, unspecified whether esophagitis present  Depression, major, recurrent, mild  Anxiety disorder, unspecified type  Prediabetes - Plan: Hemoglobin A1c  Hyperlipidemia, unspecified hyperlipidemia type - Plan: Lipid panel, Hepatic function panel  We do not have RSV vaccine in our records. No changes today.  If you need refills for medications you take chronically, please call your pharmacy. Do not use My Chart to request refills or for acute issues that need immediate attention. If you send a my chart message, it may take a few days to be addressed, specially if I am not in the office.  Please be sure medication list is accurate. If a new problem present, please set up appointment sooner than planned today.

## 2024-07-23 NOTE — Assessment & Plan Note (Signed)
 BP adequately controlled, most home BP readings are normal. Continue benazepril  20 mg p.m. and 1/2 tablet a.m. Continue low-salt diet and monitoring BP regularly

## 2024-07-23 NOTE — Progress Notes (Addendum)
 Chief Complaint  Patient presents with   Medical Management of Chronic Issues    Pt Is fasted and reports she is not sure why Rx for acid reflux was sent. So she had only taken it one time. Pt reports having lightheaded and concerns about weight lost.    Discussed the use of AI scribe software for clinical note transcription with the patient, who gave verbal consent to proceed. History of Present Illness Crystal Levy is an 83 year old female  with PMHx significant for HTN, hypothyroidism, HLD, insomnia, depression, anxiety, PAD, OA, and hx of polio here today for follow up. Last seen on 04/06/2024, when she was complaining about heartburn and occasional epigastric sharp pain as well as some difficulty swallowing. Omeprazole  40 mg was recommended and GI referral placed. She reports that she had a swallow test conducted in [place] returned normal results. She has not experienced heartburn since starting omeprazole , which she only took for two days and has since discontinued. She attributes her swallowing difficulties to her soft palate and is trying to be more careful when eating, especially when hungry.  -Her A1c was noted to be mildly elevated  in the past. No hx of diabetes. In 05/2023 it was 5.9%.  -Anxiety, depression,and insomnia: She takes citalopram  40 mg daily and lorazepam  1 mg at bedtime for sleep. These medications have been effective, although she has been waking up early with a racing mind for the past few days. No recent stress or significant life events contributing to her anxiety.   -She experienced a fall on August 20th while opening Venetian blinds, resulting in headaches on one side of her head for several months, which have since resolved. She did not seek medical attention for this fall.  She has lost 11 pounds since June, attributed to a diet plan she is now discontinuing. She has learned more about portion control and plans to maintain her weight loss through  these new habits.   HLD: She started taking Rosuvastatin  5 mg daily about 3 weeks ago. She would like lipid panel to be done today.  Lab Results  Component Value Date   CHOL 191 06/05/2023   HDL 51.10 06/05/2023   LDLCALC 113 (H) 06/05/2023   LDLDIRECT 111.0 12/26/2020   TRIG 138.0 06/05/2023   CHOLHDL 4 06/05/2023   Lab Results  Component Value Date   ALT 9 06/05/2023   AST 16 06/05/2023   ALKPHOS 88 06/05/2023   BILITOT 0.8 06/05/2023   She is on benazepril  20 mg for HTN, taking one tablet at night and half in the morning.  Her blood pressure readings are generally good, though she occasionally experiences SBP's readings in the 140s.  Negative for severe/frequent headache, visual changes, chest pain, dyspnea, palpitation, focal weakness, or edema.  She reports lightheadedness when getting up quickly, which she noticed during physical therapy and in recent days.  Lab Results  Component Value Date   NA 137 04/06/2024   CL 99 04/06/2024   K 4.0 04/06/2024   CO2 31 04/06/2024   BUN 19 04/06/2024   CREATININE 0.85 04/06/2024   GFR 63.41 04/06/2024   CALCIUM  9.4 04/06/2024   ALBUMIN 4.2 06/05/2023   GLUCOSE 88 04/06/2024   She experienced elevated blood pressure when taking prednisone for left knee pain, which she discontinued after two days of treatment. She follows with orthopedist. She completed physical therapy for her knee and hip pain earlier this week. She also received an injection in her right  shoulder for pain and weakness, which has not provided relief.  Review of Systems  Constitutional:  Negative for activity change, appetite change, chills and fever.  HENT:  Negative for sore throat.   Respiratory:  Negative for cough and wheezing.   Gastrointestinal:  Negative for abdominal pain, nausea and vomiting.  Genitourinary:  Negative for decreased urine volume, dysuria and hematuria.  Musculoskeletal:  Positive for arthralgias and gait problem.  Skin:  Negative  for rash.  Neurological:  Negative for syncope and facial asymmetry.  Psychiatric/Behavioral:  Negative for confusion and hallucinations.   See other pertinent positives and negatives in HPI.  Current Outpatient Medications on File Prior to Visit  Medication Sig Dispense Refill   albuterol  (VENTOLIN  HFA) 108 (90 Base) MCG/ACT inhaler Inhale 2 puffs into the lungs every 4 (four) hours as needed for wheezing or shortness of breath. 18 g 1   azelastine  (ASTELIN ) 0.1 % nasal spray Place 2 sprays into both nostrils 2 (two) times daily as needed for allergies. Use in each nostril as directed 30 mL 5   benazepril  (LOTENSIN ) 20 MG tablet TAKE ONE-HALF TABLET BY MOUTH IN THE MORNING AND 1 TABLET BY  MOUTH IN THE EVENING 150 tablet 2   cetirizine  (ZYRTEC ) 10 MG tablet Take 1 tablet by mouth once daily 30 tablet 0   Cholecalciferol (VITAMIN D-3) 1000 UNITS CAPS Take 1,000 Units by mouth daily.     citalopram  (CELEXA ) 40 MG tablet Take 1 tablet (40 mg total) by mouth daily. 90 tablet 2   fluticasone  (FLONASE ) 50 MCG/ACT nasal spray Place 2 sprays into both nostrils daily. (Patient taking differently: Place 2 sprays into both nostrils daily. As needed) 48 g 3   Iodine, Kelp, (KELP PO) Take 1 tablet by mouth daily.     levothyroxine  (SYNTHROID ) 88 MCG tablet Take 1 tablet (88 mcg total) by mouth daily before breakfast. 100 tablet 2   LORazepam  (ATIVAN ) 1 MG tablet TAKE 1 TABLET BY MOUTH AT BEDTIME AS NEEDED FOR ANXIETY 30 tablet 3   magnesium hydroxide (MILK OF MAGNESIA) 400 MG/5ML suspension Take 5 mLs by mouth daily as needed for mild constipation.     polyethylene glycol (MIRALAX  / GLYCOLAX ) 17 g packet Take 17 g by mouth daily.     rosuvastatin  (CRESTOR ) 5 MG tablet TAKE 1 TABLET BY MOUTH DAILY 100 tablet 2   Selenium (SELENIMIN PO) Take 1 tablet by mouth daily.     No current facility-administered medications on file prior to visit.    Past Medical History:  Diagnosis Date   Anxiety    Arthritis     Bicuspid aortic valve    Depression    Heart murmur    Hypertension    Hypothyroidism    PAD (peripheral artery disease)    Palpitations    Pneumonia    Polio    Thyroid  disease    Allergies  Allergen Reactions   Clindamycin/Lincomycin Diarrhea and Other (See Comments)    C-Diff   Fluticasone      Headache and nosebleed    Azithromycin     Unknown   Egg Protein-Containing Drug Products     Per allergy  testing   Milk-Related Compounds     Per allergy  testing   Penicillins Rash   Sulfa Antibiotics Diarrhea   Fenofibrate Rash    Social History   Socioeconomic History   Marital status: Divorced    Spouse name: Not on file   Number of children: 2   Years of  education: Not on file   Highest education level: Master's degree (e.g., MA, MS, MEng, MEd, MSW, MBA)  Occupational History    Employer: [employer]  Tobacco Use   Smoking status: Never   Smokeless tobacco: Never   Tobacco comments:    Rare smoker in the distant past.   Vaping Use   Vaping status: Never Used  Substance and Sexual Activity   Alcohol use: Yes    Alcohol/week: 1.0 standard drink of alcohol    Types: 1 Glasses of wine per week    Comment: wine 3 times per week   Drug use: No   Sexual activity: Not on file  Other Topics Concern   Not on file  Social History Narrative   Lives alone. 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 Right handed   Social Drivers of Health   Financial Resource Strain: Low Risk  (04/26/2024)   Overall Financial Resource Strain (CARDIA)    Difficulty of Paying Living Expenses: Not hard at all  Food Insecurity: No Food Insecurity (04/26/2024)   Hunger Vital Sign    Worried About Running Out of Food in the Last Year: Never true    Ran Out of Food in the Last Year: Never true  Transportation Needs: No Transportation Needs (04/26/2024)   PRAPARE - Administrator, Civil Service (Medical): No    Lack of Transportation (Non-Medical): No  Physical Activity:  Sufficiently Active (04/26/2024)   Exercise Vital Sign    Days of Exercise per Week: 7 days    Minutes of Exercise per Session: 40 min  Stress: No Stress Concern Present (04/26/2024)   [employer] of Occupational Health - Occupational Stress Questionnaire    Feeling of Stress: Not at all  Social Connections: Moderately Integrated (04/26/2024)   Social Connection and Isolation Panel    Frequency of Communication with Friends and Family: More than three times a week    Frequency of Social Gatherings with Friends and Family: More than three times a week    Attends Religious Services: More than 4 times per year    Active Member of [employer] or Organizations: Yes    Attends Engineer, Structural: More than 4 times per year    Marital Status: Divorced  Recent Concern: Social Connections - Moderately Isolated (04/05/2024)   Social Connection and Isolation Panel    Frequency of Communication with Friends and Family: More than three times a week    Frequency of Social Gatherings with Friends and Family: Once a week    Attends Religious Services: Never    Database Administrator or Organizations: Yes    Attends Engineer, Structural: More than 4 times per year    Marital Status: Divorced    Vitals:   07/23/24 1001  BP: 136/66  Pulse: 63  Resp: 16  Temp: 98.4 F (36.9 C)  SpO2: 98%   Wt Readings from Last 3 Encounters:  07/23/24 137 lb (62.1 kg)  04/26/24 135 lb 4.8 oz (61.4 kg)  04/06/24 143 lb 8 oz (65.1 kg)   Body mass index is 23.52 kg/m.  Physical Exam Vitals and nursing note reviewed.  Constitutional:      General: She is not in acute distress.    Appearance: She is well-developed.  HENT:     Head: Normocephalic and atraumatic.     Mouth/Throat:     Mouth: Mucous membranes are moist.     Pharynx: Oropharynx is clear. Uvula midline. No posterior oropharyngeal erythema.  Eyes:     Conjunctiva/sclera: Conjunctivae normal.  Cardiovascular:     Rate and  Rhythm: Normal rate and regular rhythm.     Heart sounds: Murmur (Soft SEM RUSB) heard.  Pulmonary:     Effort: Pulmonary effort is normal. No respiratory distress.     Breath sounds: Normal breath sounds.  Musculoskeletal:     Right lower leg: No edema.     Left lower leg: No edema.  Lymphadenopathy:     Cervical: No cervical adenopathy.  Skin:    General: Skin is warm.     Findings: No erythema or rash.  Neurological:     Mental Status: She is alert and oriented to person, place, and time.     Comments: Mildly unstable gait, not assisted today. RLE weakness, wearing a brace, polio sequela. Rest with no significant deficit.    Psychiatric:        Mood and Affect: Mood and affect normal.   ASSESSMENT AND PLAN:  Ms. Mithra Spano was seen today for medical management of chronic issues.  Diagnoses and all orders for this visit: Orders Placed This Encounter  Procedures   Hemoglobin A1c   Lipid panel   Hepatic function panel  *** Gastroesophageal reflux disease, unspecified whether esophagitis present Assessment & Plan: She saw GI after her last visit. Symptoms have resolved. She is no longer on omeprazole  40 mg, 2 medication for 2 days.   Primary hypertension Assessment & Plan: BP adequately controlled, most home BP readings are normal. Continue benazepril  20 mg p.m. and 1/2 tablet a.m. Continue low-salt diet and monitoring BP regularly   Depression, major, recurrent, mild Assessment & Plan: Stable. Continue citalopram  40 mg daily.   Anxiety disorder, unspecified type Assessment & Plan: Problem is stable. Continue citalopram  40 mg daily and lorazepam  1 mg daily at bedtime.   Prediabetes Assessment & Plan: A healthy lifestyle encouraged for diabetes prevention. Last hemoglobin A1c was 5.9 in 05/2023. Further recommendation will be given according to hemoglobin A1c result.  Orders: -     Hemoglobin A1c; Future  Hyperlipidemia, unspecified hyperlipidemia  type Assessment & Plan: She started rosuvastatin  5 mg about 3 weeks ago, she would like lipid panel done today. Further recommendation will be given according to lipid panel result.  Orders: -     Lipid panel; Future -     Hepatic function panel; Future   Return in about 25 weeks (around 01/14/2025) for chronic problems.  Teria Khachatryan G. Hatley Henegar, MD  [facility] [place] [facility].

## 2024-07-23 NOTE — Assessment & Plan Note (Signed)
 Problem is stable. Continue citalopram  40 mg daily and lorazepam  1 mg daily at bedtime.

## 2024-07-23 NOTE — Assessment & Plan Note (Signed)
 Stable.  Continue citalopram 40 mg daily.

## 2024-07-26 LAB — HEMOGLOBIN A1C: Hgb A1c MFr Bld: 6.1 % (ref 4.6–6.5)

## 2024-07-27 ENCOUNTER — Ambulatory Visit: Payer: Self-pay | Admitting: Family Medicine

## 2024-07-28 ENCOUNTER — Other Ambulatory Visit: Payer: Self-pay | Admitting: Family Medicine

## 2024-07-28 DIAGNOSIS — E785 Hyperlipidemia, unspecified: Secondary | ICD-10-CM

## 2024-08-10 ENCOUNTER — Other Ambulatory Visit: Payer: Self-pay | Admitting: Allergy and Immunology

## 2024-08-30 ENCOUNTER — Other Ambulatory Visit: Payer: Self-pay | Admitting: Family Medicine

## 2024-08-30 DIAGNOSIS — F419 Anxiety disorder, unspecified: Secondary | ICD-10-CM

## 2024-08-30 DIAGNOSIS — F330 Major depressive disorder, recurrent, mild: Secondary | ICD-10-CM

## 2024-08-30 MED ORDER — CITALOPRAM HYDROBROMIDE 40 MG PO TABS: 40.0000 mg | ORAL_TABLET | Freq: Every day | ORAL | 2 refills | Status: AC

## 2024-08-30 NOTE — Telephone Encounter (Signed)
 Copied from CRM #[id number]. Topic: Clinical - Medication Refill >> Aug 30, 2024  1:47 PM Thersia C wrote: Medication: citalopram  (CELEXA ) 40 MG tablet   Has the patient contacted their pharmacy? Yes (Agent: If no, request that the patient contact the pharmacy for the refill. If patient does not wish to contact the pharmacy document the reason why and proceed with request.) (Agent: If yes, when and what did the pharmacy advise?)  This is the patient's preferred pharmacy:   [facility] - [place], [place] - [address] [place] [address] Phone: [phone] Fax: [phone]  Is this the correct pharmacy for this prescription? Yes If no, delete pharmacy and type the correct one.   Has the prescription been filled recently? No  Is the patient out of the medication? Yes  Has the patient been seen for an appointment in the last year OR does the patient have an upcoming appointment? Yes  Can we respond through MyChart? Yes  Agent: Please be advised that Rx refills may take up to 3 business days. We ask that you follow-up with your pharmacy.

## 2024-09-08 ENCOUNTER — Other Ambulatory Visit: Payer: Self-pay | Admitting: Family Medicine

## 2024-09-11 ENCOUNTER — Other Ambulatory Visit: Payer: Self-pay | Admitting: Allergy and Immunology

## 2024-09-19 ENCOUNTER — Other Ambulatory Visit: Payer: Self-pay | Admitting: Family Medicine

## 2024-09-19 DIAGNOSIS — F419 Anxiety disorder, unspecified: Secondary | ICD-10-CM

## 2025-01-21 ENCOUNTER — Ambulatory Visit: Admitting: Family Medicine

## 2025-04-06 ENCOUNTER — Ambulatory Visit: Admitting: Internal Medicine

## 2025-05-02 ENCOUNTER — Ambulatory Visit
